# Patient Record
Sex: Male | Born: 1950
Health system: Southern US, Community
[De-identification: ages and names within clinical notes are randomized; demographics above are authoritative.]

## PROBLEM LIST (undated history)

## (undated) DIAGNOSIS — Z8719 Personal history of other diseases of the digestive system: Secondary | ICD-10-CM

## (undated) DIAGNOSIS — F329 Major depressive disorder, single episode, unspecified: Secondary | ICD-10-CM

## (undated) DIAGNOSIS — I2510 Atherosclerotic heart disease of native coronary artery without angina pectoris: Secondary | ICD-10-CM

## (undated) DIAGNOSIS — K219 Gastro-esophageal reflux disease without esophagitis: Secondary | ICD-10-CM

## (undated) DIAGNOSIS — M79606 Pain in leg, unspecified: Secondary | ICD-10-CM

## (undated) DIAGNOSIS — E079 Disorder of thyroid, unspecified: Secondary | ICD-10-CM

## (undated) DIAGNOSIS — F32A Depression, unspecified: Secondary | ICD-10-CM

## (undated) DIAGNOSIS — I469 Cardiac arrest, cause unspecified: Secondary | ICD-10-CM

## (undated) DIAGNOSIS — E039 Hypothyroidism, unspecified: Secondary | ICD-10-CM

## (undated) DIAGNOSIS — Z95810 Presence of automatic (implantable) cardiac defibrillator: Secondary | ICD-10-CM

## (undated) DIAGNOSIS — F419 Anxiety disorder, unspecified: Secondary | ICD-10-CM

## (undated) DIAGNOSIS — E876 Hypokalemia: Secondary | ICD-10-CM

## (undated) DIAGNOSIS — J45909 Unspecified asthma, uncomplicated: Secondary | ICD-10-CM

## (undated) DIAGNOSIS — I5042 Chronic combined systolic (congestive) and diastolic (congestive) heart failure: Secondary | ICD-10-CM

## (undated) DIAGNOSIS — Z720 Tobacco use: Secondary | ICD-10-CM

## (undated) DIAGNOSIS — I4891 Unspecified atrial fibrillation: Secondary | ICD-10-CM

## (undated) DIAGNOSIS — E785 Hyperlipidemia, unspecified: Secondary | ICD-10-CM

## (undated) HISTORY — DX: Chronic combined systolic (congestive) and diastolic (congestive) heart failure: I50.42

## (undated) HISTORY — DX: Hyperlipidemia, unspecified: E78.5

## (undated) HISTORY — DX: Depression, unspecified: F32.A

## (undated) HISTORY — DX: Tobacco use: Z72.0

## (undated) HISTORY — DX: Anxiety disorder, unspecified: F41.9

## (undated) HISTORY — DX: Pain in leg, unspecified: M79.606

## (undated) HISTORY — DX: Disorder of thyroid, unspecified: E07.9

## (undated) HISTORY — DX: Cardiac arrest, cause unspecified: I46.9

## (undated) HISTORY — DX: Major depressive disorder, single episode, unspecified: F32.9

## (undated) HISTORY — DX: Unspecified atrial fibrillation: I48.91

## (undated) HISTORY — DX: Hypokalemia: E87.6

---
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
## 1989-04-10 HISTORY — PX: CARDIAC DEFIBRILLATOR PLACEMENT: SHX171

## 1992-04-10 DIAGNOSIS — I469 Cardiac arrest, cause unspecified: Secondary | ICD-10-CM

## 1992-04-10 HISTORY — DX: Cardiac arrest, cause unspecified: I46.9

## 2003-02-17 ENCOUNTER — Ambulatory Visit (HOSPITAL_COMMUNITY): Admission: RE | Admit: 2003-02-17 | Discharge: 2003-02-17 | Payer: Self-pay | Admitting: Internal Medicine

## 2003-02-26 ENCOUNTER — Encounter: Admission: RE | Admit: 2003-02-26 | Discharge: 2003-02-26 | Payer: Self-pay | Admitting: Internal Medicine

## 2004-05-18 ENCOUNTER — Ambulatory Visit: Payer: Self-pay | Admitting: Internal Medicine

## 2004-05-25 ENCOUNTER — Ambulatory Visit: Payer: Self-pay

## 2005-12-19 ENCOUNTER — Ambulatory Visit: Payer: Self-pay | Admitting: Internal Medicine

## 2006-05-03 ENCOUNTER — Ambulatory Visit: Payer: Self-pay | Admitting: Internal Medicine

## 2006-07-26 ENCOUNTER — Ambulatory Visit: Payer: Self-pay | Admitting: Internal Medicine

## 2007-01-04 ENCOUNTER — Ambulatory Visit: Payer: Self-pay | Admitting: Internal Medicine

## 2007-01-28 ENCOUNTER — Ambulatory Visit: Payer: Self-pay | Admitting: Internal Medicine

## 2007-01-28 LAB — CONVERTED CEMR LAB
BUN: 8 mg/dL (ref 6–23)
Basophils Absolute: 0 10*3/uL (ref 0.0–0.1)
Basophils Relative: 0.2 % (ref 0.0–1.0)
CO2: 29 meq/L (ref 19–32)
Calcium: 9.2 mg/dL (ref 8.4–10.5)
Chloride: 103 meq/L (ref 96–112)
Creatinine, Ser: 0.9 mg/dL (ref 0.4–1.5)
Eosinophils Absolute: 0.1 10*3/uL (ref 0.0–0.6)
Eosinophils Relative: 0.5 % (ref 0.0–5.0)
GFR calc Af Amer: 113 mL/min
GFR calc non Af Amer: 93 mL/min
Glucose, Bld: 97 mg/dL (ref 70–99)
HCT: 39 % (ref 39.0–52.0)
Hemoglobin: 13.5 g/dL (ref 13.0–17.0)
INR: 0.8 (ref 0.8–1.0)
Lymphocytes Relative: 27.5 % (ref 12.0–46.0)
MCHC: 34.6 g/dL (ref 30.0–36.0)
MCV: 99.2 fL (ref 78.0–100.0)
Monocytes Absolute: 0.3 10*3/uL (ref 0.2–0.7)
Monocytes Relative: 2.5 % — ABNORMAL LOW (ref 3.0–11.0)
Neutro Abs: 6.9 10*3/uL (ref 1.4–7.7)
Neutrophils Relative %: 69.3 % (ref 43.0–77.0)
Platelets: 151 10*3/uL (ref 150–400)
Potassium: 4.4 meq/L (ref 3.5–5.1)
Prothrombin Time: 10.9 s (ref 10.9–13.3)
RBC: 3.93 M/uL — ABNORMAL LOW (ref 4.22–5.81)
RDW: 12.7 % (ref 11.5–14.6)
Sodium: 137 meq/L (ref 135–145)
WBC: 10.1 10*3/uL (ref 4.5–10.5)
aPTT: 23.7 s (ref 21.7–29.8)

## 2007-01-30 ENCOUNTER — Ambulatory Visit: Payer: Self-pay | Admitting: Internal Medicine

## 2007-01-30 ENCOUNTER — Observation Stay (HOSPITAL_COMMUNITY): Admission: RE | Admit: 2007-01-30 | Discharge: 2007-01-30 | Payer: Self-pay | Admitting: Internal Medicine

## 2007-02-14 ENCOUNTER — Ambulatory Visit: Payer: Self-pay

## 2007-05-21 ENCOUNTER — Ambulatory Visit: Payer: Self-pay | Admitting: Internal Medicine

## 2007-08-22 ENCOUNTER — Ambulatory Visit: Payer: Self-pay | Admitting: Internal Medicine

## 2007-11-21 ENCOUNTER — Ambulatory Visit: Payer: Self-pay | Admitting: Internal Medicine

## 2008-02-20 ENCOUNTER — Ambulatory Visit: Payer: Self-pay | Admitting: Internal Medicine

## 2008-06-05 ENCOUNTER — Encounter: Payer: Self-pay | Admitting: Internal Medicine

## 2008-12-10 ENCOUNTER — Ambulatory Visit: Payer: Self-pay | Admitting: Internal Medicine

## 2008-12-11 ENCOUNTER — Encounter: Payer: Self-pay | Admitting: Internal Medicine

## 2009-03-11 ENCOUNTER — Ambulatory Visit: Payer: Self-pay | Admitting: Internal Medicine

## 2009-03-15 ENCOUNTER — Encounter: Payer: Self-pay | Admitting: Internal Medicine

## 2009-03-17 ENCOUNTER — Encounter: Payer: Self-pay | Admitting: Internal Medicine

## 2009-03-24 ENCOUNTER — Telehealth: Payer: Self-pay | Admitting: Internal Medicine

## 2009-06-08 ENCOUNTER — Ambulatory Visit: Payer: Self-pay | Admitting: Internal Medicine

## 2009-06-08 DIAGNOSIS — F17200 Nicotine dependence, unspecified, uncomplicated: Secondary | ICD-10-CM | POA: Insufficient documentation

## 2009-09-09 ENCOUNTER — Ambulatory Visit: Payer: Self-pay | Admitting: Internal Medicine

## 2009-09-10 ENCOUNTER — Encounter: Payer: Self-pay | Admitting: Internal Medicine

## 2009-12-09 ENCOUNTER — Ambulatory Visit: Payer: Self-pay | Admitting: Internal Medicine

## 2009-12-10 ENCOUNTER — Encounter (INDEPENDENT_AMBULATORY_CARE_PROVIDER_SITE_OTHER): Payer: Self-pay | Admitting: *Deleted

## 2010-03-10 ENCOUNTER — Ambulatory Visit: Payer: Self-pay | Admitting: Internal Medicine

## 2010-04-20 ENCOUNTER — Encounter (INDEPENDENT_AMBULATORY_CARE_PROVIDER_SITE_OTHER): Payer: Self-pay | Admitting: *Deleted

## 2010-05-10 NOTE — Cardiovascular Report (Signed)
Summary: Office Visit Remote   Office Visit Remote   Imported By: Roderic Ovens 01/03/2010 16:08:18  _____________________________________________________________________  External Attachment:    Type:   Image     Comment:   External Document

## 2010-05-10 NOTE — Cardiovascular Report (Signed)
Summary: Office Visit Remote   Office Visit Remote   Imported By: Roderic Ovens 10/19/2009 13:35:56  _____________________________________________________________________  External Attachment:    Type:   Image     Comment:   External Document

## 2010-05-10 NOTE — Cardiovascular Report (Signed)
Summary: Office Visit   Office Visit   Imported By: Roderic Ovens 06/18/2009 11:08:56  _____________________________________________________________________  External Attachment:    Type:   Image     Comment:   External Document

## 2010-05-10 NOTE — Letter (Signed)
Summary: Device-Delinquent Phone Journalist, newspaper, Main Office  [address]   [place], [place] [address]   Phone: [phone]  Fax: [phone]     December 10, 2009 MRN: [id number]   Jeremy Walker [address] [place], [place]  [address]   Dear [name],  According to our records, you were scheduled for a device phone transmission on   12/09/09.                           .     We did not receive any results from this check.  If you transmitted on your scheduled day, please call us to help troubleshoot your system.  If you forgot to send your transmission, please send one upon receipt of this letter.  Thank you, Altha Harm, LPN  December 10, 2009 3:46 PM   [facility]

## 2010-05-10 NOTE — Assessment & Plan Note (Signed)
Summary: F1Y DEF CK GUIDANT      Allergies Added: NKDA  CC:  f1y device check guidant.  .  History of Present Illness: [name] is seen in followup for aborted cardiac arrest. He is status post ICD implantation. These previous events both occurs in a profound hypokalemia the cause of which was never understood  He continues to smoke     Current Medications (verified): 1)  Atenolol 50 Mg Tabs (Atenolol) .... Take One Tablet Once Daily  Allergies (verified): No Known Drug Allergies  Past History:  Past Medical History: Last updated: 06/07/2009 ICD -- Guidant - 1857 /Aborted sudden cardiac death  Ongoing cigarette abuse  Past Surgical History: Last updated: 06/07/2009 AICD - Guidant -1857 - Aborted sudden cardiac death  Vital Signs:  Patient profile:   60 year old male Height:      72 inches Weight:      171 pounds BMI:     23.28 Pulse rate:   67 / minute Pulse rhythm:   regular BP sitting:   103 / 67  (right arm) Cuff size:   large  Vitals Entered By: Judithe Modest CMA (June 08, 2009 4:16 PM)  Physical Exam  General:  The patient was alert and oriented in no acute distress. HEENT Normal.  Neck veins were flat, carotids were brisk.  Lungs were clear.  Heart sounds were regular without murmurs or gallops.  Abdomen was soft with active bowel sounds. There is no clubbing cyanosis or edema. Skin Warm and dry smells like an ashtray    ICD Specifications Following MD:  Sherryl Manges, MD     ICD Vendor:  [facility] [employer]     ICD Model Number:  [id number]     ICD Serial Number:  [id number] ICD DOI:  01/30/2007     ICD Implanting MD:  Sherryl Manges, MD  Lead 1:    Location: RV     DOI: 05/28/1992     Model #: 1027     Serial #: [id number]     Status: active  Indications::  Aborted Sudden Cardiac Death   ICD Follow Up Remote Check?  No Battery Voltage:  3.13 V     Charge Time:  14.6 seconds     Underlying rhythm:  SR ICD Dependent:  No       ICD Device  Measurements Right Ventricle:  Amplitude: 7.1 mV, Impedance: 373 ohms, Threshold: 2.0 V at 0.5 msec  Episodes Coumadin:  No Shock:  0     ATP:  0     Nonsustained:  0     Ventricular Pacing:  0%  Brady Parameters Mode VVI     Lower Rate Limit:  50      Tachy Zones VF:  210     Next Remote Date:  09/09/2009     Next Cardiology Appt Due:  06/09/2010 Tech Comments:  No parameter changes.  Device function normal.  Latitude transmissions every 3 months.  ROV 1 year with Dr. Graciela Husbands. Altha Harm, LPN  June 08, 4032 4:30 PM   Impression & Recommendations:  Problem # 1:  ICD - IN SITU - GUIDANT - 1857 (ICD-V45.02) Device parameters and data were reviewed and no changes were made   Orders: EKG w/ Interpretation (93[address])  Problem # 2:  VENTRICULAR FIBRILLATION-ABORTED SCD (ICD-427.41) no recurrent events His updated medication list for this problem includes:    Atenolol 50 Mg Tabs (Atenolol) .Marland Kitchen... Take one tablet once daily  Problem #  3:  TOBACCO ABUSE (ICD-305.1) talked about it again

## 2010-05-12 NOTE — Letter (Signed)
Summary: Remote Device Check  [employer], Main Office  [address]   [place], [place] [address]   Phone: [phone]  Fax: [phone]     April 20, 2010 MRN: [id number]   KEVYN BOQUET [address] [place], [place]  [address]   Dear Mr. Luckow,   Your remote transmission was recieved and reviewed by your physician.  All diagnostics were within normal limits for you.  _____Your next transmission is scheduled for:                       .  Please transmit at any time this day.  If you have a wireless device your transmission will be sent automatically.  ______Your next office visit is scheduled for:                              . Please call our office to schedule an appointment.    Sincerely,  Vella Kohler

## 2010-05-12 NOTE — Cardiovascular Report (Signed)
Summary: Office Visit Remote   Office Visit Remote   Imported By: Roderic Ovens 04/22/2010 11:09:44  _____________________________________________________________________  External Attachment:    Type:   Image     Comment:   External Document

## 2010-05-12 NOTE — Letter (Signed)
Summary: Remote Device Check  [employer], Main Office  [address]   [place], [place] [address]   Phone: [phone]  Fax: [phone]     April 20, 2010 MRN: [id number]   ALEXX MCBURNEY [address] [place], [place]  [address]   Dear Mr. Mabile,   Your remote transmission was recieved and reviewed by your physician.  All diagnostics were within normal limits for you.   ___X___Your next office visit is scheduled for: 07-05-10 @ 415pm with Dr Graciela Husbands.    Sincerely,  Vella Kohler

## 2010-06-17 ENCOUNTER — Encounter (INDEPENDENT_AMBULATORY_CARE_PROVIDER_SITE_OTHER): Payer: Self-pay | Admitting: *Deleted

## 2010-06-21 ENCOUNTER — Encounter: Payer: Self-pay | Admitting: Internal Medicine

## 2010-06-21 NOTE — Letter (Signed)
Summary: Appointment - Reschedule  [employer], Main Office  [address]   [place], [place] [address]   Phone: [phone]  Fax: [phone]     June 17, 2010 MRN: [id number]   ROSCOE WITTS [address] [place], [place]  [address]   Dear Mr. Funez,   Due to a change in our office schedule, your appointment on   07-05-2010                    at  4:00 p.m.  must be changed.  It is very important that we reach you to reschedule this appointment. We look forward to participating in your health care needs. Please contact us at the number listed above at your earliest convenience to reschedule this appointment.     Sincerely,     Lorne Skeens  [facility] Scheduling Team

## 2010-07-05 ENCOUNTER — Encounter: Payer: Self-pay | Admitting: Internal Medicine

## 2010-07-26 ENCOUNTER — Ambulatory Visit (INDEPENDENT_AMBULATORY_CARE_PROVIDER_SITE_OTHER): Payer: 59 | Admitting: Internal Medicine

## 2010-07-26 ENCOUNTER — Encounter: Payer: Self-pay | Admitting: Internal Medicine

## 2010-07-26 VITALS — BP 113/73 | HR 73 | Ht 72.0 in | Wt 168.4 lb

## 2010-07-26 DIAGNOSIS — I4901 Ventricular fibrillation: Secondary | ICD-10-CM

## 2010-07-26 DIAGNOSIS — Z95810 Presence of automatic (implantable) cardiac defibrillator: Secondary | ICD-10-CM | POA: Insufficient documentation

## 2010-07-26 DIAGNOSIS — I428 Other cardiomyopathies: Secondary | ICD-10-CM

## 2010-07-26 DIAGNOSIS — F17200 Nicotine dependence, unspecified, uncomplicated: Secondary | ICD-10-CM

## 2010-07-26 NOTE — Patient Instructions (Signed)
Your physician recommends that you schedule a follow-up appointment in: YEAR WITH DR KLEIN  Your physician recommends that you continue on your current medications as directed. Please refer to the Current Medication list given to you today. 

## 2010-07-26 NOTE — Progress Notes (Signed)
  HPI  Jeremy Walker is a 60 y.o. male seen in followup for blurred cardiac arrest for which he is status post ICD. He had no intercurrent arrhythmias.  He continues to smoke. He think he can stop today. He has chronic shortness of breath.   Past Medical History  Diagnosis Date  . Sudden cardiac death     aborted    Past Surgical History  Procedure Date  . Cardiac defibrillator placement     guidant    Current Outpatient Prescriptions  Medication Sig Dispense Refill  . atenolol (TENORMIN) 50 MG tablet Take 50 mg by mouth daily.          No Known Allergies  Review of Systems negative except from HPI and PMH  Physical Exam Well developed and well nourished in no acute distress HENT normal with poor dentition E scleral and icterus clear Neck Supple JVP flat; carotids brisk and full Clear to ausculation but decreased breath sounds Regular rate and rhythm, no murmurs gallops or rub Soft with active bowel sounds No clubbing cyanosis and edema Alert and oriented, grossly normal motor and sensory function Skin Warm and Dry     Assessment and  Plan

## 2010-07-26 NOTE — Assessment & Plan Note (Signed)
The patient's device was interrogated.  The information was reviewed. No changes were made in the programming.    

## 2010-07-26 NOTE — Assessment & Plan Note (Signed)
No nintercurrent arrhtyhmias

## 2010-07-26 NOTE — Assessment & Plan Note (Signed)
He s going to try STOPPING today  HIs wife jsut stopped

## 2010-08-23 NOTE — Assessment & Plan Note (Signed)
[facility]                         ELECTROPHYSIOLOGY OFFICE NOTE   JIMI, SCHAPPERT                         MRN:          [id number]  DATE:02/14/2007                            DOB:          10-18-1950    Mr. Withers was seen today in the device clinic for followup of his newly  changed out Guidant ICD, model number is 1857.  Date of implant was  January 30, 2007, for aborted sudden cardiac death.  Interrogation of  his device demonstrates R waves of 6 mV with an RV impedance of 337 ohms  and a threshold of 2.8 V at 0.5 msec.  His shock impedance was 50 ohms,  and his battery voltage was 3.24 V with a charge time of 13.6 seconds.  He was in sinus rhythm today and does not V pace.  He has had no  episodes of any arrhythmias since last interrogation.  His device is  implanted in his abdomen.  His Steri-Strips were removed today.  His  wound was without redness or swelling.  He will return to clinic in 3  months with Dr. Graciela Husbands.      Gypsy Balsam, RN,BSN  Electronically Signed      Duke Salvia, MD, [facility]  Electronically Signed   AS/MedQ  DD: 02/18/2007  DT: 02/18/2007  Job #: [id number]

## 2010-08-23 NOTE — Op Note (Signed)
NAMEOREST, DYGERT NO.:  [id number]   MEDICAL RECORD NO.:  [id number]          PATIENT TYPE:  INP   LOCATION:  2899                         FACILITY:  [facility]   PHYSICIAN:  Duke Salvia, MD, FACCDATE OF BIRTH:  [birth year]   DATE OF PROCEDURE:  01/30/2007  DATE OF DISCHARGE:                               OPERATIVE REPORT   PREOPERATIVE DIAGNOSIS:  Aborted sudden death with previously implanted  abdominal defibrillator, now at end of life.   POSTOPERATIVE DIAGNOSIS:  Aborted sudden death with previously implanted  abdominal defibrillator, now at end of life.   PROCEDURE:  Explantation of a previously implanted device, implantation  of a new device with intraoperative defibrillation threshold testing.   Following obtaining informed consent, the patient was brought to the  electrophysiology laboratory and placed on the fluoroscopic table in  supine position.  After routine prep and drape, lidocaine was  infiltrated along the line of his previous abdominal incision.  The  incision was made and carried down to layer of the pocket using blunt  dissection and electrocautery.  The pocket was opened.   The previously implanted device was freed up.  It had a dual head bipole  and dual head defibrillator coil.   Interrogation of the previously implanted lead demonstrated an R wave of  8.2, with impedance of 512, a threshold of 2.5 volts at 0.5  milliseconds.  Current at threshold 1.5 MA, pacing impedance through the  distal coil was 440 ohms and through the proximal coil was 382 ohms.   With these acceptable parameters recorded, the leads were attached to a  Guidant Prism HEICD model 1857, serial number [id number].  Through the  device, the R wave of 7.8 with impedance of 433, a threshold 2.4, 0.5  milliseconds.  High-voltage impedance was 48 ohms.   DFT testing was undertaken.  Ventricular fibrillation was induced via  the T-wave shock.  After a total duration of 9  seconds, a 21 joules  shock was delivered through measured resistance of 50 ohms terminating  ventricular fibrillation  restoring sinus rhythm.  The patient's pocket was copiously irrigated  with antibiotic containing saline solution.  Hemostasis was assured.  The leads and pulse generator were placed in the pocket.  The wound was  closed in three layers using Monocryl suture.  The patient tolerated the  procedure well and without apparent complication.      Duke Salvia, MD, [facility]  Electronically Signed     SCK/MEDQ  D:  01/30/2007  T:  01/31/2007  Job:  [id number]   cc:   Electrophys lab  [employer] pacemaker cl

## 2010-08-23 NOTE — Assessment & Plan Note (Signed)
[facility]                         ELECTROPHYSIOLOGY OFFICE NOTE   TALLON, GERTZ                         MRN:          [id number]  DATE:01/04/2007                            DOB:          03-01-1951    Mr. Ramsburg comes in.  He has aborted cardiac arrest.  He is status post  ICD implantation.  He has had no intercurrent problems.  He continues to  smoke.  He has no problems with chest pain.  He does have exercise  intolerance.   PRESENT MEDICATIONS:  Include the Atenolol 50.   EXAMINATION:  His blood pressure is 102/64, pulse is 68.  LUNGS:  Clear.  HEART SOUNDS:  Regular.  EXTREMITIES:  Without edema.  SKIN:  Warm and dry.   Interrogation of his Guidant Prizm ICD demonstrates an R-wave of R-wave  of 5.8 with impedance of 335 and threshold at 2 volts at 0.5.  Battery  voltage is 2.71, representing status MOL1.   IMPRESSION:  1. Aborted cardiac arrest (ACA).  2. Status post ICD for the above.  3. Ongoing cigarette abuse.   Mr. Weigel is stable from a rhythm point of view.  He and his wife and I  had a lengthy discussion today regarding smoking cessation, the  importance of choosing a plan, the potential benefits of Chantix, the  value of doing it simultaneously and trying to come up with alternative  behaviors to try to mitigate against the suggestive parts of smoking.  I  failed to tell them, but will the next time, about the 1-800 STOPNOW  phone service.     Duke Salvia, MD, [facility]  Electronically Signed    SCK/MedQ  DD: 01/04/2007  DT: 01/05/2007  Job #: [id number]

## 2010-08-23 NOTE — Assessment & Plan Note (Signed)
[facility]                         ELECTROPHYSIOLOGY OFFICE NOTE   [name], [name]                         MRN:          [id number]  DATE:05/21/2007                            DOB:          1950-11-16    [name] has a history of aborted sudden cardiac death.  He is status  post ICD implantation with device generator placement in October.  He is  doing well without intercurrent problems.  He continues to smoke.  He  continues to have shortness of breath.   MEDICATIONS:  Include atenolol 50 mg a day.   EXAMINATION:  His blood pressure is 100/78, his pulse was 68.  LUNGS:  Clear.  HEART:  Sounds were regular.  EXTREMITIES:  Without edema.   Interrogation of his Guidant ICD demonstrates an R wave of 7.9 with  impedance of 366, a threshold of 2.8 volts at 0.5 milliseconds.  Battery  voltage is 3.24.   IMPRESSION:  1. Aborted sudden cardiac death.  2. Status post implantable cardioverter-defibrillator for the above.  3. Ongoing smoking.   [name] continues to smoke despite our encouragements and admonitions  to the contrary.  We will plan to see him again in 3-6 months' time.     Jeremy Salvia, MD, [facility]  Electronically Signed    SCK/MedQ  DD: 05/21/2007  DT: 05/23/2007  Job #: [id number]

## 2010-08-26 NOTE — Assessment & Plan Note (Signed)
[facility]                         ELECTROPHYSIOLOGY OFFICE NOTE   RAM, HAUGAN                         MRN:          [id number]  DATE:06/09/2008                            DOB:          1950/04/24    Jeremy Walker is seen in followup for aborted sudden cardiac death.  He is  status post ICD implantation with a generator replacement in October  2008.  He had no intercurrent problems with chest pain or shortness of  breath, apart from his chronic dyspnea associated with his smoking.  He  continues to smoke like a chimney, and he and his wife are here today  and they both smell like ashtrays.  They are adamant about continuing  smoking and we dropped it there.   MEDICATIONS:  Atenolol 50.   PHYSICAL EXAMINATION:  VITAL SIGNS:  His blood pressure is 125/77, pulse  is 83, weight was 175 which is up 6 pounds.  LUNGS:  Clear unbelievably.  HEART:  Sounds were regular.  EXTREMITIES:  No edema.   Interrogation of his Guidant ICD demonstrates an R-wave of 7.6 with  impedance of 380, threshold of 2.6 at 0.5.  Shock impedance was 54.  Battery voltage was 3.22.   IMPRESSION:  1. Aborted sudden cardiac death.  2. Status post implantable cardioverter-defibrillator for the above.  3. Ongoing cigarette abuse.   Jeremy Walker is stable and we will see him again in a year and he will be  followed up remotely in the interim.     Duke Salvia, MD, [facility]  Electronically Signed    SCK/MedQ  DD: 06/09/2008  DT: 06/10/2008  Job #: [id number]

## 2010-08-26 NOTE — Op Note (Signed)
NAMECOLSEN, MODI                          ACCOUNT NO.:  [id number]   MEDICAL RECORD NO.:  [id number]                   PATIENT TYPE:  OIB   LOCATION:  2855                                 FACILITY:  [facility]   PHYSICIAN:  Duke Salvia, M.D.               DATE OF BIRTH:  12-29-1950   DATE OF PROCEDURE:  02/17/2003  DATE OF DISCHARGE:                                 OPERATIVE REPORT   PREOPERATIVE DIAGNOSIS:  Recurrent ventricular fibrillation.   POSTOPERATIVE DIAGNOSIS:  Recurrent ventricular fibrillation.   PROCEDURE:  Explanation from previous implanted abdominal defibrillator with  implantation of new defibrillator.  Intraoperative defibrillation threshold  testing.   SURGEON:  Duke Salvia, M.D.   DESCRIPTION OF PROCEDURE:  Following the obtainment of informed consent, the  patient was brought to the Electrophysiology Laboratory and placed on the  fluoroscopic table in the supine position.  After routine prep and drape,  lidocaine was infiltrated along the line of the previous incision and  carried down to the layer of the abdominal pocket using blunt dissection and  electrocautery.  The pocket was opened.  Great care was taken keeping all of  these in their native position.  The previously implanted defibrillator was  explanted.  The patient had a previously implanted 0064 defibrillator lead  and a bifurcated rate sense lead.  Through the analyzer, the bipolar R-wave  was 10 mV with an impedance of 431 and pacing threshold of 1.8 volts at 0.5  msec.  Current threshold was 1.7 in May.  The proximal coil impedance was  416.  The distal coil was 399.   When these acceptable parameters were recorded, the leads were then attached  to a Guidant Prism HVRHE model 1857 defibrillator, serial number [id number].  This had the appropriate header.  Through the device, a bipolar R-wave of  6.6 mV with a pacing impedance of 326 ohms the pacing threshold of 2.4 volts  at 0.5 msec. The  high voltage impedance was 47 ohms.   With the acceptable parameters recorded, defibrillator threshold testing was  undertaken.  Ventricular fibrillation was induced via the dual wave shock.  After a total duration of 8 seconds, a 14 joule shock was delivered through  a measured resistance of 50 ohms, terminating in ventricular fibrillation  and restoring a sinus/paced rhythm.  The pocket was copiously irrigated with  antibiotic-containing saline solution.  Hemostasis was assured.  The wound  was closed in three layers in a normal fashion.  The wound was washed, dried  and Benzoin and Steri-Strips dressing was  applied.  Needle counts, sponge counts and instrument counts were correct at  the end of the procedure according to the staff.   The patient tolerated the procedure without apparent complication.  Duke Salvia, M.D.    SCK/MEDQ  D:  02/17/2003  T:  02/17/2003  Job:  [id number]   cc:   Electrophysiology Laboratory   E. Graceann Congress, M.D.

## 2010-08-26 NOTE — Assessment & Plan Note (Signed)
[facility]                           ELECTROPHYSIOLOGY OFFICE NOTE   VERLIN, Jeremy Walker                         MRN:          [id number]  DATE:12/19/2005                            DOB:          Dec 11, 1950    [name] has a history of aborted sudden cardiac death.  He is status post  ICD implantation and he is doing quite well.  He continues to smoke.  He  takes atenolol 50 mg a day.   EXAMINATION:  VITAL SIGNS:  His blood pressure is 115/64.  His pulse is 69.  LUNGS:  Lungs were clear.  HEART:  Heart sounds were regular.  EXTREMITIES:  The extremities were without edema.   Interrogation of his Guidant Prizm [id number] ICD demonstrates an R wave of 4.0,  an impedance of 332, and threshold of 2.4 at 0.5, battery voltage is 3.12.   IMPRESSION:  1. Aborted sudden cardiac death.  2. Status post ICD for the above.  He will begin a remote followup and we      will see him again in 1 year's time.  He is further advised to stop      smoking.                                   Duke Salvia, MD, [facility]   SCK/MedQ  DD:  12/19/2005  DT:  12/20/2005  Job #:  [id number]

## 2010-09-23 ENCOUNTER — Telehealth: Payer: Self-pay | Admitting: Internal Medicine

## 2010-09-23 NOTE — Telephone Encounter (Signed)
Call patient to reschedule his appointment from 6/26 to 6/22  With  Dr. Graciela Husbands. Pt has question ask to why he was never told his device reach ERI.

## 2010-09-23 NOTE — Telephone Encounter (Signed)
Device @ ERI 09/17/10, patient aware.  Follow up scheduled 6/22@ 4:15pm

## 2010-09-30 ENCOUNTER — Ambulatory Visit (INDEPENDENT_AMBULATORY_CARE_PROVIDER_SITE_OTHER): Payer: 59 | Admitting: Internal Medicine

## 2010-09-30 ENCOUNTER — Encounter: Payer: Self-pay | Admitting: *Deleted

## 2010-09-30 ENCOUNTER — Encounter: Payer: Self-pay | Admitting: Internal Medicine

## 2010-09-30 DIAGNOSIS — F17200 Nicotine dependence, unspecified, uncomplicated: Secondary | ICD-10-CM

## 2010-09-30 DIAGNOSIS — Z95810 Presence of automatic (implantable) cardiac defibrillator: Secondary | ICD-10-CM

## 2010-09-30 DIAGNOSIS — I4901 Ventricular fibrillation: Secondary | ICD-10-CM

## 2010-09-30 NOTE — Assessment & Plan Note (Signed)
Still unwilling to stop

## 2010-09-30 NOTE — Patient Instructions (Signed)
Your physician has recommended that you have a defibrillator generator replacement.  Please see the instruction sheet given to you today for more information.

## 2010-09-30 NOTE — Assessment & Plan Note (Signed)
No recurrent arrhythmia. 

## 2010-09-30 NOTE — Assessment & Plan Note (Signed)
Patient's device has reached ERI. We'll plan to undertake device generator replacement with the use of adapter So as to be able to be use newer technology. Risks and benefits have been reviewed including the possibilities of infection and the fracture. We'll use an AEGIS pouch

## 2010-09-30 NOTE — Progress Notes (Signed)
  HPI  Jeremy Walker is a 60 y.o. male Followup for a reported cardiac arrest that happened recurrently many years ago.  On both occasions he were associated with profound hypokalemia the cause of which was never elucidated. He is status post ICD implantation and prior generator replacement. He has reached ERI.  He denies chest pain and lower extremity claudication. He has chronic shortness of breath. He's not had any edema  Past Medical History  Diagnosis Date  . Sudden cardiac death     aborted    Past Surgical History  Procedure Date  . Cardiac defibrillator placement     guidant    Current Outpatient Prescriptions  Medication Sig Dispense Refill  . atenolol (TENORMIN) 50 MG tablet Take 50 mg by mouth daily.          No Known Allergies  Review of Systems negative except from HPI and PMH  Physical Exam Well developed and well nourished in no acute distress With an odor of tobacco HENT normal E scleral and icterus clear Neck Supple JVP flat; carotids brisk and full Clear to ausculation But decreased breath sounds Regular rate and rhythm, no murmurs gallops or rub Soft with active bowel sounds No clubbing cyanosis and edema Alert and oriented, grossly normal motor and sensory function Decreased peripheral pulses Skin Warm and Dry  ECG  Assessment and  Plan

## 2010-10-04 ENCOUNTER — Encounter: Payer: 59 | Admitting: Internal Medicine

## 2010-10-10 ENCOUNTER — Other Ambulatory Visit: Payer: 59 | Admitting: *Deleted

## 2010-10-10 ENCOUNTER — Other Ambulatory Visit (INDEPENDENT_AMBULATORY_CARE_PROVIDER_SITE_OTHER): Payer: 59

## 2010-10-10 DIAGNOSIS — I4901 Ventricular fibrillation: Secondary | ICD-10-CM

## 2010-10-10 DIAGNOSIS — Z95810 Presence of automatic (implantable) cardiac defibrillator: Secondary | ICD-10-CM

## 2010-10-10 DIAGNOSIS — F17200 Nicotine dependence, unspecified, uncomplicated: Secondary | ICD-10-CM

## 2010-10-10 LAB — CBC WITH DIFFERENTIAL/PLATELET
Basophils Absolute: 0.3 10*3/uL — ABNORMAL HIGH (ref 0.0–0.1)
Basophils Relative: 1.9 % (ref 0.0–3.0)
Eosinophils Absolute: 0 10*3/uL (ref 0.0–0.7)
Eosinophils Relative: 0.3 % (ref 0.0–5.0)
HCT: 43.1 % (ref 39.0–52.0)
Hemoglobin: 14.9 g/dL (ref 13.0–17.0)
Lymphocytes Relative: 14.5 % (ref 12.0–46.0)
Lymphs Abs: 2.2 10*3/uL (ref 0.7–4.0)
MCHC: 34.7 g/dL (ref 30.0–36.0)
MCV: 99.1 fl (ref 78.0–100.0)
Monocytes Absolute: 0.5 10*3/uL (ref 0.1–1.0)
Monocytes Relative: 3.1 % (ref 3.0–12.0)
Neutro Abs: 12.3 10*3/uL — ABNORMAL HIGH (ref 1.4–7.7)
Neutrophils Relative %: 80.2 % — ABNORMAL HIGH (ref 43.0–77.0)
Platelets: 144 10*3/uL — ABNORMAL LOW (ref 150.0–400.0)
RBC: 4.35 Mil/uL (ref 4.22–5.81)
RDW: 14.5 % (ref 11.5–14.6)
WBC: 15.4 10*3/uL — ABNORMAL HIGH (ref 4.5–10.5)

## 2010-10-10 LAB — BASIC METABOLIC PANEL
BUN: 14 mg/dL (ref 6–23)
CO2: 28 mEq/L (ref 19–32)
Calcium: 9.4 mg/dL (ref 8.4–10.5)
Chloride: 103 mEq/L (ref 96–112)
Creatinine, Ser: 1.1 mg/dL (ref 0.4–1.5)
GFR: 72.75 mL/min (ref >60.00)
Glucose, Bld: 99 mg/dL (ref 70–99)
Potassium: 4.7 mEq/L (ref 3.5–5.1)
Sodium: 140 mEq/L (ref 135–145)

## 2010-10-10 LAB — PROTIME-INR
INR: 0.9 ratio (ref 0.8–1.0)
Prothrombin Time: 10.3 s (ref 10.2–12.4)

## 2010-10-11 ENCOUNTER — Telehealth: Payer: Self-pay | Admitting: Internal Medicine

## 2010-10-11 NOTE — Telephone Encounter (Signed)
Patient WBC 15.4 (H) in pre-op labs. Pt is scheduled for ICD placement on 10/13/10.  Pt's wife states that pt. Is not running a fever. But yesterday he worked in a hot weather all day before he got the labs done, and  maybe that is why his WBC are elevated (per wife).

## 2010-10-11 NOTE — Telephone Encounter (Signed)
Order for repeat CBC in Short stay Thursday morning send. Pt. aware

## 2010-10-11 NOTE — Telephone Encounter (Signed)
Returning call back to [place]. Per pt wife - pls wait 30 min before calling back.

## 2010-10-13 ENCOUNTER — Ambulatory Visit (HOSPITAL_COMMUNITY)
Admission: RE | Admit: 2010-10-13 | Discharge: 2010-10-13 | Disposition: A | Discharge status: Home / Self Care | Payer: 59 | Source: Ambulatory Visit | Attending: Internal Medicine | Admitting: Internal Medicine

## 2010-10-13 ENCOUNTER — Ambulatory Visit (HOSPITAL_COMMUNITY): Payer: 59

## 2010-10-13 DIAGNOSIS — Z8674 Personal history of sudden cardiac arrest: Secondary | ICD-10-CM | POA: Insufficient documentation

## 2010-10-13 DIAGNOSIS — I469 Cardiac arrest, cause unspecified: Secondary | ICD-10-CM

## 2010-10-13 DIAGNOSIS — Z79899 Other long term (current) drug therapy: Secondary | ICD-10-CM | POA: Insufficient documentation

## 2010-10-13 DIAGNOSIS — F17200 Nicotine dependence, unspecified, uncomplicated: Secondary | ICD-10-CM | POA: Insufficient documentation

## 2010-10-13 DIAGNOSIS — Z45018 Encounter for adjustment and management of other part of cardiac pacemaker: Secondary | ICD-10-CM | POA: Insufficient documentation

## 2010-10-13 LAB — SURGICAL PCR SCREEN
MRSA, PCR: NEGATIVE
Staphylococcus aureus: NEGATIVE

## 2010-10-13 LAB — CBC
HCT: 43.5 % (ref 39.0–52.0)
Hemoglobin: 15 g/dL (ref 13.0–17.0)
MCH: 33 pg (ref 26.0–34.0)
MCHC: 34.5 g/dL (ref 30.0–36.0)
MCV: 95.8 fL (ref 78.0–100.0)
Platelets: 146 10*3/uL — ABNORMAL LOW (ref 150–400)
RBC: 4.54 MIL/uL (ref 4.22–5.81)
RDW: 13.9 % (ref 11.5–15.5)
WBC: 8.3 10*3/uL (ref 4.0–10.5)

## 2010-10-14 ENCOUNTER — Encounter: Payer: Self-pay | Admitting: *Deleted

## 2010-10-24 ENCOUNTER — Ambulatory Visit (INDEPENDENT_AMBULATORY_CARE_PROVIDER_SITE_OTHER): Payer: 59 | Admitting: *Deleted

## 2010-10-24 ENCOUNTER — Ambulatory Visit: Payer: 59 | Admitting: *Deleted

## 2010-10-24 DIAGNOSIS — I4901 Ventricular fibrillation: Secondary | ICD-10-CM

## 2010-10-24 DIAGNOSIS — Z95810 Presence of automatic (implantable) cardiac defibrillator: Secondary | ICD-10-CM

## 2010-10-27 ENCOUNTER — Encounter: Payer: 59 | Admitting: *Deleted

## 2010-10-27 NOTE — Op Note (Signed)
Jeremy, [name]:  [id number]  MEDICAL RECORD NO.:  [id number]  LOCATION:  [facility]                         FACILITY:  [facility]  PHYSICIAN:  Duke Salvia, MD, FACCDATE OF BIRTH:  1950-07-15  DATE OF PROCEDURE:  10/13/2010 DATE OF DISCHARGE:                              OPERATIVE REPORT   PREOPERATIVE DIAGNOSIS:  Previously aborted cardiac arrest.  POSTOPERATIVE DIAGNOSIS:  Previously aborted cardiac arrest.  PROCEDURE:  Explantation of abdominal device, pocket revision, implantation of a new abdominal device, intraoperative defibrillation threshold testing.  Following obtaining informed consent, the patient was brought to the electrophysiology laboratory and placed on the fluoroscopic table in a supine position.  After routine prep and drape of the abdomen, an incision was made about half a centimeter caudal and parallel to the previous incision and carried down to layer of device pocket using sharp dissection and electrocautery.  The pocket was opened.  The leads were explanted.  There was a 6.1 defibrillator bifurcated lead and 3.1 rate/sense leads.  They were separated from the previously implanted device.  They were attached to a bipolar Oscor adapter, serial number [id number] was attached to the previously implanted rate/sense lead and a double bipolar configuration having been tested 6.8 with a pace impedance of 438 and threshold 1.8 at 0.5.  The previously implanted defibrillator leads were attached two Guidant adapters and I do not know the numbers of which how they were relate to the distal and proximal, they were [id number] and [id number], they both model 6931.  The were then attached in line to the defibrillator lead.  The pacing impedance of which had been 410 to the proximal coil of 408 through the distal coil.  We then attached the leads to a [employer] device, model E141, serial number [id number].  Through the device, bipolar R-wave  was 6.1 with a pace impedance of 435, with threshold 2.0 volts at 0.4 milliseconds.  High-voltage impedance was 64 ohms.  We then did a 1.1 joules commanded shock.  This was done in a synchronous way and the impedance was 44 ohms.  The pocket was revised to house the larger footprint device and the Aegis antimicrobial patch, this lot number was [id number]. Defibrillation threshold testing was then undertaken.  Ventricular fibrillation was induced via the T-wave shock.  After a total duration of 7 seconds, a 23 joules shock was delivered through measured resistance of 48 ohms, terminating ventricular fibrillation and restoring sinus rhythm.  This was undertaken in REVERSE POLARITY.  The device was implanted.  The pocket was copiously irrigated with antibiotic containing saline solution.  Hemostasis was assured.  The leads and pulse generator were placed into the pocket. Inside the Aegis, the pouch was secured to the prepectoral scar and the wound was then closed in three layers in normal fashion.  The wound was washed, dried, benzoin and Steri-Strip dressing was applied.  Needle counts, sponge counts, and instrument counts were correct at the end of the procedure according to staff.  The patient tolerated the procedure without apparent complication.     Duke Salvia, MD, [facility]     SCK/MEDQ  D:  10/13/2010  T:  10/13/2010  Job:  [id number]  Electronically Signed by Sherryl Manges MD [facility] on 10/27/2010 02:13:23 PM

## 2010-12-13 ENCOUNTER — Telehealth: Payer: Self-pay | Admitting: Internal Medicine

## 2010-12-13 NOTE — Telephone Encounter (Signed)
Pt's wife calling Nurse, mental health

## 2010-12-14 NOTE — Telephone Encounter (Signed)
Credit issue taken care of with the hospital.

## 2011-01-24 ENCOUNTER — Other Ambulatory Visit: Payer: Self-pay | Admitting: Internal Medicine

## 2011-02-01 ENCOUNTER — Other Ambulatory Visit: Payer: Self-pay | Admitting: Internal Medicine

## 2011-02-01 ENCOUNTER — Encounter: Payer: Self-pay | Admitting: Internal Medicine

## 2011-02-01 ENCOUNTER — Ambulatory Visit (INDEPENDENT_AMBULATORY_CARE_PROVIDER_SITE_OTHER): Payer: 59 | Admitting: Internal Medicine

## 2011-02-01 DIAGNOSIS — I4901 Ventricular fibrillation: Secondary | ICD-10-CM

## 2011-02-01 DIAGNOSIS — Z95810 Presence of automatic (implantable) cardiac defibrillator: Secondary | ICD-10-CM

## 2011-02-01 DIAGNOSIS — F17200 Nicotine dependence, unspecified, uncomplicated: Secondary | ICD-10-CM

## 2011-02-01 LAB — ICD DEVICE OBSERVATION
BRDY-0002RV: 40 {beats}/min
CHARGE TIME: 8.853 s
DEV-0020ICD: NEGATIVE
DEVICE MODEL ICD: 115351
HV IMPEDENCE: 64 Ohm
RV LEAD AMPLITUDE: 7.6 mv
RV LEAD IMPEDENCE ICD: 362 Ohm
RV LEAD THRESHOLD: 1.8 V
TOT-0001: 2
TOT-0002: 0

## 2011-02-01 NOTE — Assessment & Plan Note (Signed)
Still smoking

## 2011-02-01 NOTE — Progress Notes (Signed)
  HPI  Jeremy Walker is a 60 y.o. male Followup for a reported cardiac arrest that happened recurrently many years ago.  On both occasions he were associated with profound hypokalemia the cause of which was never elucidated. He is status post ICD implantation and prior generator replacement.he underwent generator replacement again June 2012  He denies chest pain and lower extremity claudication. He has chronic shortness of breath. He's not had any edema  Past Medical History  Diagnosis Date  . Sudden cardiac death     aborted  . Automatic implantable cardiac defibrillator     Abdominal implant was 0064 Guidant lead 6/5 mm  . Hypokalemia     Recurrent associated with cardiac arrest  . Tobacco abuse     Past Surgical History  Procedure Date  . Cardiac defibrillator placement     guidant    Current Outpatient Prescriptions  Medication Sig Dispense Refill  . atenolol (TENORMIN) 50 MG tablet TAKE ONE TABLET BY MOUTH EVERY DAY  90 tablet  4  . Multiple Vitamin (MULTIVITAMIN) capsule Take 1 capsule by mouth daily.          No Known Allergies  Review of Systems negative except from HPI and PMH  Physical Exam Well developed and well nourished in no acute distress With an odor of tobacco HENT normal E scleral and icterus clear Neck Supple JVP flat; carotids brisk and full Clear to ausculation But decreased breath sounds Abd pocket well healed  Soft with active bowel sounds No clubbing cyanosis and edema Alert and oriented, grossly normal motor and sensory function Decreased peripheral pulses Skin Warm and Dry   Assessment and  Plan

## 2011-02-01 NOTE — Assessment & Plan Note (Signed)
The patient's device was interrogated.  The information was reviewed. No changes were made in the programming.    

## 2011-02-01 NOTE — Assessment & Plan Note (Signed)
No recurrent arrhythmia. 

## 2011-02-01 NOTE — Patient Instructions (Signed)

## 2011-05-04 ENCOUNTER — Encounter: Payer: 59 | Admitting: *Deleted

## 2011-05-08 ENCOUNTER — Encounter: Payer: Self-pay | Admitting: *Deleted

## 2011-05-11 ENCOUNTER — Ambulatory Visit (INDEPENDENT_AMBULATORY_CARE_PROVIDER_SITE_OTHER): Payer: Self-pay | Admitting: *Deleted

## 2011-05-11 ENCOUNTER — Encounter: Payer: Self-pay | Admitting: Internal Medicine

## 2011-05-11 DIAGNOSIS — I4901 Ventricular fibrillation: Secondary | ICD-10-CM

## 2011-05-11 DIAGNOSIS — Z95810 Presence of automatic (implantable) cardiac defibrillator: Secondary | ICD-10-CM

## 2011-05-13 LAB — REMOTE ICD DEVICE
BRDY-0002RV: 40 {beats}/min
CHARGE TIME: 9 s
DEV-0020ICD: NEGATIVE
DEVICE MODEL ICD: 115351
FVT: 0
HV IMPEDENCE: 65 Ohm
PACEART VT: 0
RV LEAD AMPLITUDE: 7.9 mv
RV LEAD IMPEDENCE ICD: 348 Ohm
TOT-0006: 20121024000000
VENTRICULAR PACING ICD: 0 pct
VF: 0

## 2011-05-17 NOTE — Progress Notes (Signed)
Remote defib check  

## 2011-08-10 ENCOUNTER — Ambulatory Visit (INDEPENDENT_AMBULATORY_CARE_PROVIDER_SITE_OTHER): Payer: Self-pay | Admitting: *Deleted

## 2011-08-10 ENCOUNTER — Encounter: Payer: Self-pay | Admitting: Internal Medicine

## 2011-08-10 DIAGNOSIS — I4901 Ventricular fibrillation: Secondary | ICD-10-CM

## 2011-08-15 LAB — REMOTE ICD DEVICE
BRDY-0002RV: 40 {beats}/min
CHARGE TIME: 9 s
DEV-0020ICD: NEGATIVE
DEVICE MODEL ICD: 115351
FVT: 0
HV IMPEDENCE: 65 Ohm
PACEART VT: 0
RV LEAD AMPLITUDE: 7.2 mv
RV LEAD IMPEDENCE ICD: 351 Ohm
TOT-0006: 20130130000000
VENTRICULAR PACING ICD: 0 pct
VF: 0

## 2011-08-18 NOTE — Progress Notes (Signed)
ICD remote 

## 2011-08-29 ENCOUNTER — Encounter: Payer: Self-pay | Admitting: Internal Medicine

## 2011-11-16 ENCOUNTER — Encounter: Payer: Self-pay | Admitting: Internal Medicine

## 2011-11-16 ENCOUNTER — Ambulatory Visit (INDEPENDENT_AMBULATORY_CARE_PROVIDER_SITE_OTHER): Payer: Self-pay | Admitting: *Deleted

## 2011-11-16 DIAGNOSIS — I4901 Ventricular fibrillation: Secondary | ICD-10-CM

## 2011-11-16 DIAGNOSIS — Z95810 Presence of automatic (implantable) cardiac defibrillator: Secondary | ICD-10-CM

## 2011-11-19 LAB — REMOTE ICD DEVICE
BRDY-0002RV: 40 {beats}/min
CHARGE TIME: 9.1 s
DEV-0020ICD: NEGATIVE
DEVICE MODEL ICD: 115351
FVT: 0
HV IMPEDENCE: 64 Ohm
PACEART VT: 0
RV LEAD AMPLITUDE: 10.1 mv
RV LEAD IMPEDENCE ICD: 353 Ohm
TOT-0006: 20130502000000
VENTRICULAR PACING ICD: 0 pct
VF: 0

## 2011-11-23 ENCOUNTER — Encounter: Payer: Self-pay | Admitting: *Deleted

## 2012-01-24 ENCOUNTER — Encounter: Payer: Self-pay | Admitting: *Deleted

## 2012-02-01 ENCOUNTER — Encounter: Payer: Self-pay | Admitting: Internal Medicine

## 2012-02-01 ENCOUNTER — Ambulatory Visit (INDEPENDENT_AMBULATORY_CARE_PROVIDER_SITE_OTHER): Payer: Self-pay | Admitting: Internal Medicine

## 2012-02-01 VITALS — BP 132/74 | HR 55 | Ht 72.0 in | Wt 162.4 lb

## 2012-02-01 DIAGNOSIS — Z95810 Presence of automatic (implantable) cardiac defibrillator: Secondary | ICD-10-CM

## 2012-02-01 DIAGNOSIS — F17200 Nicotine dependence, unspecified, uncomplicated: Secondary | ICD-10-CM

## 2012-02-01 DIAGNOSIS — I4901 Ventricular fibrillation: Secondary | ICD-10-CM

## 2012-02-01 LAB — ICD DEVICE OBSERVATION
BRDY-0002RV: 40 {beats}/min
DEV-0020ICD: NEGATIVE
DEVICE MODEL ICD: 115351
HV IMPEDENCE: 64 Ohm
RV LEAD AMPLITUDE: 8.1 mv
RV LEAD IMPEDENCE ICD: 356 Ohm
RV LEAD THRESHOLD: 1.8 V
VENTRICULAR PACING ICD: 1 pct

## 2012-02-01 NOTE — Assessment & Plan Note (Signed)
The patient's device was interrogated.  The information was reviewed. No changes were made in the programming.    

## 2012-02-01 NOTE — Assessment & Plan Note (Signed)
We discussed strategies and cost of antismoking tools. We have given him the phone number for the [facility] as well as [facility] long program.

## 2012-02-01 NOTE — Patient Instructions (Signed)
Device clinic in 6 months

## 2012-02-01 NOTE — Progress Notes (Signed)
  HPI  Jeremy Walker is a 61 y.o. male Followup for a reported cardiac arrest that happened recurrently many years ago.  On both occasions he were associated with profound hypokalemia the cause of which was never elucidated. He is status post ICD implantation and prior generator replacement.he underwent generator replacement again June 2012  He denies chest pain and lower extremity claudication. He has chronic shortness of breath. He's not had any edema  He and his wife are interested in stopping smoking. Past Medical History  Diagnosis Date  . Sudden cardiac death     aborted  . Automatic implantable cardiac defibrillator     Abdominal implant was 0064 Guidant lead 6/5 mm  . Hypokalemia     Recurrent associated with cardiac arrest  . Tobacco abuse     Past Surgical History  Procedure Date  . Cardiac defibrillator placement     guidant    Current Outpatient Prescriptions  Medication Sig Dispense Refill  . atenolol (TENORMIN) 50 MG tablet TAKE ONE TABLET BY MOUTH EVERY DAY  90 tablet  4    No Known Allergies  Review of Systems negative except from HPI and PMH  Physical Exam BP 132/74  Pulse 55  Ht 6' (1.829 m)  Wt 162 lb 6.4 oz (73.664 kg)  BMI 22.03 kg/m2  Well developed and well nourished in no acute distress With an odor of tobacco HENT normal E scleral and icterus clear Neck Supple Clear to ausculation But decreased breath sounds Abd pocket well healed  Soft with active bowel sounds No clubbing cyanosis and edema Alert and oriented, grossly normal motor and sensory function Decreased peripheral pulses Skin Warm and Dry  ECG: Sinus Rhythm  @54             Intervals  17/09/44  Axis 43     Assessment and  Plan

## 2012-02-01 NOTE — Assessment & Plan Note (Signed)
No intercurrent Ventricular tachycardia  

## 2012-02-28 ENCOUNTER — Telehealth: Payer: Self-pay | Admitting: Internal Medicine

## 2012-02-28 MED ORDER — ATENOLOL 50 MG PO TABS: 50.0000 mg | ORAL_TABLET | Freq: Every day | ORAL | Status: DC

## 2012-02-28 NOTE — Telephone Encounter (Signed)
New Problem:     Patient's wife called in needing a refill of her husband's atenolol (TENORMIN) 50 MG tablet sent to the [facility] on [address] Please call back if you have any questions.

## 2012-07-17 ENCOUNTER — Ambulatory Visit (INDEPENDENT_AMBULATORY_CARE_PROVIDER_SITE_OTHER): Payer: BC Managed Care – PPO | Admitting: *Deleted

## 2012-07-17 ENCOUNTER — Other Ambulatory Visit: Payer: Self-pay

## 2012-07-17 DIAGNOSIS — I4901 Ventricular fibrillation: Secondary | ICD-10-CM

## 2012-07-17 LAB — ICD DEVICE OBSERVATION
BRDY-0002RV: 40 {beats}/min
DEV-0020ICD: NEGATIVE
DEVICE MODEL ICD: 115351
HV IMPEDENCE: 65 Ohm
RV LEAD AMPLITUDE: 9.6 mv
RV LEAD IMPEDENCE ICD: 354 Ohm
RV LEAD THRESHOLD: 1.5 V
VENTRICULAR PACING ICD: 1 pct

## 2012-07-17 NOTE — Progress Notes (Signed)
ICD check 

## 2012-08-09 ENCOUNTER — Encounter: Payer: Self-pay | Admitting: Internal Medicine

## 2012-12-26 ENCOUNTER — Encounter: Payer: Self-pay | Admitting: Internal Medicine

## 2013-01-28 ENCOUNTER — Encounter: Payer: BC Managed Care – PPO | Admitting: Internal Medicine

## 2013-02-06 ENCOUNTER — Ambulatory Visit (INDEPENDENT_AMBULATORY_CARE_PROVIDER_SITE_OTHER): Payer: BC Managed Care – PPO | Admitting: Internal Medicine

## 2013-02-06 ENCOUNTER — Encounter: Payer: Self-pay | Admitting: Internal Medicine

## 2013-02-06 VITALS — BP 124/68 | HR 63 | Ht 72.0 in | Wt 161.0 lb

## 2013-02-06 DIAGNOSIS — Z95810 Presence of automatic (implantable) cardiac defibrillator: Secondary | ICD-10-CM

## 2013-02-06 DIAGNOSIS — F17200 Nicotine dependence, unspecified, uncomplicated: Secondary | ICD-10-CM

## 2013-02-06 DIAGNOSIS — I4901 Ventricular fibrillation: Secondary | ICD-10-CM

## 2013-02-06 LAB — ICD DEVICE OBSERVATION
BRDY-0002RV: 40 {beats}/min
CHARGE TIME: 9.3 s
DEV-0020ICD: NEGATIVE
DEVICE MODEL ICD: 115351
HV IMPEDENCE: 66 Ohm
RV LEAD AMPLITUDE: 10.4 mv
RV LEAD IMPEDENCE ICD: 330 Ohm
RV LEAD THRESHOLD: 1.9 V
VENTRICULAR PACING ICD: 1 pct

## 2013-02-06 NOTE — Assessment & Plan Note (Signed)
Admonished again to stop smoking. He really wishes package of cigarettes. His wife is having surgery for an aortic aneurysm. They're considering more strictly stopping smoking

## 2013-02-06 NOTE — Progress Notes (Signed)
      Patient Care Team: Sheliah Hatch, MD as PCP - General (Family Medicine)   HPI  Jeremy Walker is a 62 y.o. male Followup for a reported cardiac arrest that happened recurrently many years ago. On both occasions he were associated with profound hypokalemia the cause of which was never elucidated. He is status post ICD implantation and prior generator replacement.he underwent generator replacement again June 2012  He denies chest pain and lower extremity claudication. He has chronic shortness of breath. He's not had any edema  He and his wife are interested in stopping smoking.  Past Medical History      Past Medical History  Diagnosis Date  . Sudden cardiac death     aborted  . Automatic implantable cardiac defibrillator     Abdominal implant was 0064 Guidant lead 6/5 mm  . Hypokalemia     Recurrent associated with cardiac arrest  . Tobacco abuse     Past Surgical History  Procedure Laterality Date  . Cardiac defibrillator placement      guidant    Current Outpatient Prescriptions  Medication Sig Dispense Refill  . atenolol (TENORMIN) 50 MG tablet Take 1 tablet (50 mg total) by mouth daily.  90 tablet  3   No current facility-administered medications for this visit.    No Known Allergies  Review of Systems negative except from HPI and PMH  Physical Exam BP 124/68  Pulse 63  Ht 6' (1.829 m)  Wt 161 lb (73.029 kg)  BMI 21.83 kg/m2  SpO2 97% Well developed and well nourished in no acute distress HENT normal E scleral and icterus clear Neck Supple JVP flat; carotids brisk and full Clear to ausculation  Regular rate and rhythm, no murmurs gallops or rub Soft with active bowel sounds No clubbing cyanosis none Edema Alert and oriented, grossly normal motor and sensory function Skin Warm and Dry  ECG NSR 16/09/42  Assessment and  Plan

## 2013-02-06 NOTE — Assessment & Plan Note (Signed)
The patient's device was interrogated.  The information was reviewed. No changes were made in the programming.    

## 2013-02-06 NOTE — Assessment & Plan Note (Signed)
No recurrent ventricular arrhythmia

## 2013-02-06 NOTE — Patient Instructions (Addendum)
Your physician recommends that you continue on your current medications as directed. Please refer to the Current Medication list given to you today.  Your physician wants you to follow-up in: 6 months with Brooke Edmisten, PA-C.  You will receive a reminder letter in the mail two months in advance. If you don't receive a letter, please call our office to schedule the follow-up appointment.  

## 2013-02-13 ENCOUNTER — Encounter: Payer: Self-pay | Admitting: Internal Medicine

## 2013-04-17 ENCOUNTER — Other Ambulatory Visit: Payer: Self-pay | Admitting: Internal Medicine

## 2013-04-21 ENCOUNTER — Ambulatory Visit: Payer: BC Managed Care – PPO | Admitting: Family Medicine

## 2013-07-31 ENCOUNTER — Other Ambulatory Visit: Payer: Self-pay | Admitting: Internal Medicine

## 2013-09-02 ENCOUNTER — Ambulatory Visit (INDEPENDENT_AMBULATORY_CARE_PROVIDER_SITE_OTHER): Payer: 59 | Admitting: Cardiology

## 2013-09-02 VITALS — BP 122/78 | HR 62 | Wt 169.0 lb

## 2013-09-02 DIAGNOSIS — I4901 Ventricular fibrillation: Secondary | ICD-10-CM

## 2013-09-02 DIAGNOSIS — Z95810 Presence of automatic (implantable) cardiac defibrillator: Secondary | ICD-10-CM

## 2013-09-02 NOTE — Patient Instructions (Addendum)
Your physician recommends that you keep your  scheduled  follow-up appointment 12/03/13 at 9:30 am [place] wants you to follow-up in: 1 year with Dr. Gari Crown will receive a reminder letter in the mail two months in advance. If you don't receive a letter, please call our office to schedule the follow-up appointment.   Your physician recommends that you continue on your current medications as directed. Please refer to the Current Medication list given to you today.

## 2013-09-02 NOTE — Progress Notes (Signed)
ELECTROPHYSIOLOGY OFFICE NOTE   Patient ID: Jeremy Walker MRN: [id number], DOB/AGE: [birth year]   Date of Visit: 09/02/2013  Primary Physician: Jeremy Asa, MD Primary Cardiologist / EP: Jeremy Nap, MD Reason for Visit: EP/device follow-up  History of Present Illness  Jeremy Walker is a 63 y.o. male who is s/p aborted cardiac arrest 20 years ago s/p single chamber ICD implantation. This was associated with profound hypokalemia, the cause of which was never elucidated. His most recent generator replacement was done June 2012.     He presents today for routine electrophysiology followup. Since last being seen in our clinic, he reports he is doing well and has no complaints. He has been in his usual state of health. He denies ICD shocks. He denies chest pain or shortness of breath. He denies palpitations, dizziness, near syncope or syncope. He denies LE swelling, orthopnea, PND or recent weight gain. He is compliant and tolerating medications without difficulty. He and his wife have quit smoking!  Past Medical History Past Medical History  Diagnosis Date  . Sudden cardiac death     aborted  . Automatic implantable cardiac defibrillator     Abdominal implant was 0064 Guidant lead 6/5 mm  . Hypokalemia     Recurrent associated with cardiac arrest  . Tobacco abuse     Past Surgical History Past Surgical History  Procedure Laterality Date  . Cardiac defibrillator placement      guidant    Allergies/Intolerances No Known Allergies  Current Home Medications Current Outpatient Prescriptions  Medication Sig Dispense Refill  . atenolol (TENORMIN) 50 MG tablet TAKE ONE TABLET BY MOUTH EVERY DAY  90 tablet  0   No current facility-administered medications for this visit.    Social History History   Social History  . Marital Status: Married    Spouse Name: N/A    Number of Children: N/A  . Years of Education: N/A   Occupational History  . Not on file.   Social History  Main Topics  . Smoking status: Current Every Day Smoker -- 1.50 packs/day for 42 years    Types: Cigarettes  . Smokeless tobacco: Never Used  . Alcohol Use: 10.5 oz/week    21 drink(s) per week  . Drug Use: No  . Sexual Activity: Not on file   Other Topics Concern  . Not on file   Social History Narrative  . No narrative on file     Review of Systems General: No chills, fever, night sweats or weight changes Cardiovascular: No chest pain, dyspnea on exertion, edema, orthopnea, palpitations, paroxysmal nocturnal dyspnea Dermatological: No rash, lesions or masses Respiratory: No cough, dyspnea Urologic: No hematuria, dysuria Abdominal: No nausea, vomiting, diarrhea, bright red blood per rectum, melena, or hematemesis Neurologic: No visual changes, weakness, changes in mental status All other systems reviewed and are otherwise negative except as noted above.  Physical Exam Vitals: Blood pressure 122/78, pulse 62, weight 169 lb (76.658 kg).  General: Well developed, well appearing 63 y.o. male in no acute distress. HEENT: Normocephalic, atraumatic. EOMs intact. Sclera nonicteric. Oropharynx clear.  Neck: Supple. No JVD. Lungs: Respirations regular and unlabored, CTA bilaterally. No wheezes, rales or rhonchi. Heart: RRR. S1, S2 present. No murmurs, rub, S3 or S4. Abdomen: Soft, non-distended. Extremities: No clubbing, cyanosis or edema. PT/Radials 2+ and equal bilaterally. Psych: Normal affect. Neuro: Alert and oriented X 3. Moves all extremities spontaneously.   Diagnostics 12-lead ECG today - NSR at 62 bpm with normal intervals; PR 166  QRS 88 QTc 436 Device interrogation today - Abdominal implant. Normal device function. Thresholds and sensing consistent with previous device measurements. RV pacing threshold chronically high but stable. Impedance trends stable over time. 1 NST episode, 2 seconds in duration, EGM reviewed and consistent with NSVT.  Otherwise no evidence of any  ventricular arrhythmias since last reset Oct 2014. Histogram distribution appropriate for patient and level of activity. No changes made this session. Device programmed at appropriate safety margins. Device programmed to optimize intrinsic conduction. Estimated longevity 11.5 years.   Assessment and Plan Aborted cardiac arrest s/p ICD implantation - normal device function - no programming changes made - counseled regarding the importance of remote monitoring; however, [name] declines and states he prefers office visits only - return to device clinic for ICD check in 3 months - return for follow-up with Dr. Caryl Walker in one year - congratulated him and his wife for smoking cessation!  Jeremy Huntsman, PA-C 09/02/2013, 4:46 PM

## 2013-09-04 LAB — MDC_IDC_ENUM_SESS_TYPE_INCLINIC
Battery Remaining Longevity: 11.5
Brady Statistic RV Percent Paced: 0 %
HIGH POWER IMPEDANCE MEASURED VALUE: 66 Ohm
Implantable Pulse Generator Serial Number: 115351
Lead Channel Impedance Value: 355 Ohm
Lead Channel Pacing Threshold Amplitude: 2.1 V
Lead Channel Sensing Intrinsic Amplitude: 10.2 mV
Lead Channel Setting Pacing Amplitude: 3 V
Lead Channel Setting Pacing Pulse Width: 1 ms
MDC IDC MSMT LEADCHNL RV PACING THRESHOLD PULSEWIDTH: 1 ms
Zone Setting Detection Interval: 285.7 ms

## 2013-09-08 ENCOUNTER — Encounter: Payer: Self-pay | Admitting: Cardiology

## 2013-10-07 ENCOUNTER — Telehealth: Payer: Self-pay

## 2013-10-07 NOTE — Telephone Encounter (Signed)
New Patient Previous PCP:  Pt states he has not seen a primary care provider in almost 30 years.   Medication List and allergies:  Updated and Reviewed  90 day supply/mail order: n/a Local prescriptions:  [facility] - [place], [place] - [address]  Immunization due:    A/P: No changes to personal, family or PSH Tdap- unsure of last vaccine Shingles- has never had CCS- has never had one PSA- not on file    To discuss with provider: Pt would like to keep Dr. Caryl Comes as his cardiologist.

## 2013-10-08 ENCOUNTER — Other Ambulatory Visit: Payer: Self-pay | Admitting: Family Medicine

## 2013-10-08 ENCOUNTER — Ambulatory Visit (INDEPENDENT_AMBULATORY_CARE_PROVIDER_SITE_OTHER): Payer: 59 | Admitting: Family Medicine

## 2013-10-08 ENCOUNTER — Encounter: Payer: Self-pay | Admitting: Family Medicine

## 2013-10-08 VITALS — BP 126/72 | HR 56 | Temp 97.6°F | Resp 20 | Ht 71.25 in | Wt 180.4 lb

## 2013-10-08 DIAGNOSIS — I4901 Ventricular fibrillation: Secondary | ICD-10-CM

## 2013-10-08 DIAGNOSIS — K219 Gastro-esophageal reflux disease without esophagitis: Secondary | ICD-10-CM

## 2013-10-08 DIAGNOSIS — F17200 Nicotine dependence, unspecified, uncomplicated: Secondary | ICD-10-CM

## 2013-10-08 DIAGNOSIS — R7989 Other specified abnormal findings of blood chemistry: Secondary | ICD-10-CM

## 2013-10-08 DIAGNOSIS — R945 Abnormal results of liver function studies: Principal | ICD-10-CM

## 2013-10-08 LAB — BASIC METABOLIC PANEL
BUN: 14 mg/dL (ref 6–23)
CO2: 27 meq/L (ref 19–32)
CREATININE: 0.8 mg/dL (ref 0.4–1.5)
Calcium: 9.2 mg/dL (ref 8.4–10.5)
Chloride: 104 mEq/L (ref 96–112)
GFR: 99.68 mL/min (ref >60.00)
Glucose, Bld: 99 mg/dL (ref 70–99)
Potassium: 4 mEq/L (ref 3.5–5.1)
Sodium: 138 mEq/L (ref 135–145)

## 2013-10-08 LAB — LIPID PANEL
Cholesterol: 220 mg/dL — ABNORMAL HIGH (ref 0–200)
HDL: 58 mg/dL (ref >39.00)
LDL Cholesterol: 142 mg/dL — ABNORMAL HIGH (ref 0–99)
NONHDL: 162
Total CHOL/HDL Ratio: 4
Triglycerides: 100 mg/dL (ref 0.0–149.0)
VLDL: 20 mg/dL (ref 0.0–40.0)

## 2013-10-08 LAB — CBC WITH DIFFERENTIAL/PLATELET
BASOS PCT: 0.5 % (ref 0.0–3.0)
Basophils Absolute: 0 10*3/uL (ref 0.0–0.1)
EOS PCT: 1.2 % (ref 0.0–5.0)
Eosinophils Absolute: 0.1 10*3/uL (ref 0.0–0.7)
HCT: 42.8 % (ref 39.0–52.0)
Hemoglobin: 14.2 g/dL (ref 13.0–17.0)
Lymphocytes Relative: 35 % (ref 12.0–46.0)
Lymphs Abs: 2.3 10*3/uL (ref 0.7–4.0)
MCHC: 33.1 g/dL (ref 30.0–36.0)
MCV: 94.6 fl (ref 78.0–100.0)
MONO ABS: 0.4 10*3/uL (ref 0.1–1.0)
MONOS PCT: 6.7 % (ref 3.0–12.0)
NEUTROS PCT: 56.6 % (ref 43.0–77.0)
Neutro Abs: 3.7 10*3/uL (ref 1.4–7.7)
PLATELETS: 144 10*3/uL — AB (ref 150.0–400.0)
RBC: 4.53 Mil/uL (ref 4.22–5.81)
RDW: 13.9 % (ref 11.5–15.5)
WBC: 6.6 10*3/uL (ref 4.0–10.5)

## 2013-10-08 LAB — HEPATIC FUNCTION PANEL
ALBUMIN: 4.1 g/dL (ref 3.5–5.2)
ALT: 97 U/L — AB (ref 0–53)
AST: 58 U/L — ABNORMAL HIGH (ref 0–37)
Alkaline Phosphatase: 93 U/L (ref 39–117)
Bilirubin, Direct: 0.1 mg/dL (ref 0.0–0.3)
Total Bilirubin: 0.6 mg/dL (ref 0.2–1.2)
Total Protein: 7.3 g/dL (ref 6.0–8.3)

## 2013-10-08 LAB — TSH: TSH: 0.74 u[IU]/mL (ref 0.35–4.50)

## 2013-10-08 NOTE — Patient Instructions (Signed)
Schedule your complete physical in 6 months We'll notify you of your lab results and make any changes if needed Congrats on quitting smoking! Call with any questions or concerns Welcome!  We're glad to have you!!

## 2013-10-08 NOTE — Assessment & Plan Note (Signed)
New to provider.  Pt having good relief w/ OTC omeprazole.  No changes at this time.  Reviewed dietary and lifestyle modifications.

## 2013-10-08 NOTE — Assessment & Plan Note (Signed)
QUIT as of March 2015.  Applauded pt's efforts.  Has extensive hx and is at higher risk for vascular and pulmonary issues due to this.  Check labs to risk stratify.  Will follow.

## 2013-10-08 NOTE — Assessment & Plan Note (Signed)
New to provider, chronic for pt.  Following w/ Dr Caryl Comes- referral entered as per insurance requirements.  Check labs.  Will follow along and assist as able.

## 2013-10-08 NOTE — Progress Notes (Signed)
Pre visit review using our clinic review tool, if applicable. No additional management support is needed unless otherwise documented below in the visit note. 

## 2013-10-08 NOTE — Progress Notes (Signed)
   Subjective:    Patient ID: Jeremy Walker, male    DOB: 10/19/1950, 63 y.o.   MRN: [id number]  HPI New to establish.  No Previous PCP.  VFib- pt was shooting pool 20 yrs ago and 'I just went out'.  Went to [place], had negative work up.  Had 2nd episode and got ICD.  Pt now following w/ Dr Caryl Comes.  ICD 'went off a couple of time when it first went in' and has not gone off since.  No CP, SOB, HAs, visual changes.  On Atenolol.  Former tobacco user- quit smoking March 2015!  GERD- chronic problem, on Omeprazole w/ good relief.  Health maintenance- pt refusing colonoscopy, due for Tdap and shingles.  Review of Systems For ROS see HPI     Objective:   Physical Exam  Vitals reviewed. Constitutional: He is oriented to person, place, and time. He appears well-developed and well-nourished. No distress.  HENT:  Head: Normocephalic and atraumatic.  Eyes: Conjunctivae and EOM are normal. Pupils are equal, round, and reactive to light.  Neck: Normal range of motion. Neck supple. No thyromegaly present.  Cardiovascular: Normal rate, regular rhythm, normal heart sounds and intact distal pulses.   No murmur heard. Pulmonary/Chest: Effort normal and breath sounds normal. No respiratory distress.  Abdominal: Soft. Bowel sounds are normal. He exhibits no distension.  Musculoskeletal: He exhibits no edema.  Lymphadenopathy:    He has no cervical adenopathy.  Neurological: He is alert and oriented to person, place, and time. No cranial nerve deficit.  Skin: Skin is warm and dry.  Psychiatric: He has a normal mood and affect. His behavior is normal.          Assessment & Plan:

## 2013-10-14 ENCOUNTER — Encounter: Payer: Self-pay | Admitting: Internal Medicine

## 2013-10-22 ENCOUNTER — Other Ambulatory Visit (INDEPENDENT_AMBULATORY_CARE_PROVIDER_SITE_OTHER): Payer: 59

## 2013-10-22 DIAGNOSIS — R7989 Other specified abnormal findings of blood chemistry: Secondary | ICD-10-CM

## 2013-10-22 DIAGNOSIS — R945 Abnormal results of liver function studies: Principal | ICD-10-CM

## 2013-10-22 LAB — HEPATIC FUNCTION PANEL
ALK PHOS: 92 U/L (ref 39–117)
ALT: 73 U/L — ABNORMAL HIGH (ref 0–53)
AST: 43 U/L — AB (ref 0–37)
Albumin: 4 g/dL (ref 3.5–5.2)
BILIRUBIN DIRECT: 0.1 mg/dL (ref 0.0–0.3)
BILIRUBIN TOTAL: 0.6 mg/dL (ref 0.2–1.2)
TOTAL PROTEIN: 6.8 g/dL (ref 6.0–8.3)

## 2013-10-23 ENCOUNTER — Other Ambulatory Visit: Payer: Self-pay | Admitting: General Practice

## 2013-10-23 MED ORDER — SIMVASTATIN 20 MG PO TABS: 20.0000 mg | ORAL_TABLET | Freq: Every day | ORAL | Status: DC

## 2013-11-15 ENCOUNTER — Other Ambulatory Visit: Payer: Self-pay | Admitting: Internal Medicine

## 2013-12-03 ENCOUNTER — Ambulatory Visit (INDEPENDENT_AMBULATORY_CARE_PROVIDER_SITE_OTHER): Payer: 59 | Admitting: *Deleted

## 2013-12-03 DIAGNOSIS — I4901 Ventricular fibrillation: Secondary | ICD-10-CM

## 2013-12-03 LAB — MDC_IDC_ENUM_SESS_TYPE_INCLINIC
Brady Statistic RV Percent Paced: 1 %
Date Time Interrogation Session: 20150826040000
HIGH POWER IMPEDANCE MEASURED VALUE: 48 Ohm
HIGH POWER IMPEDANCE MEASURED VALUE: 69 Ohm
Implantable Pulse Generator Serial Number: 115351
Lead Channel Impedance Value: 348 Ohm
Lead Channel Pacing Threshold Amplitude: 1.7 V
Lead Channel Pacing Threshold Pulse Width: 1 ms
Lead Channel Setting Pacing Amplitude: 3 V
Lead Channel Setting Pacing Pulse Width: 1 ms
Lead Channel Setting Sensing Sensitivity: 0.4 mV
MDC IDC MSMT LEADCHNL RV SENSING INTR AMPL: 8.3 mV
MDC IDC SET ZONE DETECTION INTERVAL: 286 ms

## 2013-12-03 NOTE — Progress Notes (Signed)
ICD check in clinic. Normal device function. Thresholds and sensing consistent with previous device measurements. Impedance trends stable over time. 1 NSVT.  Histogram distribution appropriate for patient and level of activity. No changes made this session. Device programmed at appropriate safety margins. Device programmed to optimize intrinsic conduction. Estimated longevity 11.5 years.  Patient education completed including shock plan. Alert tones/vibration demonstrated for patient.  ROV 6 months with Dr. Caryl Comes.

## 2013-12-04 ENCOUNTER — Ambulatory Visit (INDEPENDENT_AMBULATORY_CARE_PROVIDER_SITE_OTHER): Payer: 59 | Admitting: Family Medicine

## 2013-12-04 ENCOUNTER — Encounter: Payer: Self-pay | Admitting: General Practice

## 2013-12-04 ENCOUNTER — Encounter: Payer: Self-pay | Admitting: Family Medicine

## 2013-12-04 VITALS — BP 140/84 | HR 49 | Temp 97.9°F | Resp 17 | Wt 184.2 lb

## 2013-12-04 DIAGNOSIS — R143 Flatulence: Secondary | ICD-10-CM

## 2013-12-04 DIAGNOSIS — R141 Gas pain: Secondary | ICD-10-CM

## 2013-12-04 DIAGNOSIS — R140 Abdominal distension (gaseous): Secondary | ICD-10-CM | POA: Insufficient documentation

## 2013-12-04 DIAGNOSIS — R142 Eructation: Secondary | ICD-10-CM

## 2013-12-04 LAB — HEPATIC FUNCTION PANEL
ALBUMIN: 3.9 g/dL (ref 3.5–5.2)
ALK PHOS: 87 U/L (ref 39–117)
ALT: 39 U/L (ref 0–53)
AST: 32 U/L (ref 0–37)
Bilirubin, Direct: 0.1 mg/dL (ref 0.0–0.3)
TOTAL PROTEIN: 6.8 g/dL (ref 6.0–8.3)
Total Bilirubin: 0.8 mg/dL (ref 0.2–1.2)

## 2013-12-04 LAB — BASIC METABOLIC PANEL
BUN: 11 mg/dL (ref 6–23)
CALCIUM: 8.9 mg/dL (ref 8.4–10.5)
CHLORIDE: 105 meq/L (ref 96–112)
CO2: 27 mEq/L (ref 19–32)
CREATININE: 1 mg/dL (ref 0.4–1.5)
GFR: 80.35 mL/min (ref >60.00)
Glucose, Bld: 110 mg/dL — ABNORMAL HIGH (ref 70–99)
Potassium: 3.8 mEq/L (ref 3.5–5.1)
SODIUM: 138 meq/L (ref 135–145)

## 2013-12-04 LAB — H. PYLORI ANTIBODY, IGG: H Pylori IgG: NEGATIVE

## 2013-12-04 MED ORDER — OMEPRAZOLE 20 MG PO CPDR: 20.0000 mg | DELAYED_RELEASE_CAPSULE | Freq: Every day | ORAL | Status: DC

## 2013-12-04 MED ORDER — ALUM HYDROXIDE-MAG CARBONATE 95-358 MG/15ML PO SUSP: 30.0000 mL | Freq: Three times a day (TID) | ORAL | Status: DC

## 2013-12-04 MED ORDER — ALUM HYDROXIDE-MAG CARBONATE 95-358 MG/15ML PO SUSP
30.0000 mL | Freq: Three times a day (TID) | ORAL | Status: DC
Start: 2013-12-04 — End: 2015-04-23

## 2013-12-04 NOTE — Progress Notes (Signed)
   Subjective:    Patient ID: Jeremy Walker, male    DOB: 1950/07/07, 63 y.o.   MRN: [id number]  HPI GERD- chronic problem.  OTC omeprazole is controlling GERD but causing increased gas.  Pt still having increased belching, wife is reporting this keeps her up at night.  Pt got Gaviscon w/ mild improvement in gas.  Pt is worried that Gaviscon would interact w/ meds.   Review of Systems For ROS see HPI     Objective:   Physical Exam  Vitals reviewed. Constitutional: He is oriented to person, place, and time. He appears well-developed and well-nourished. No distress.  Cardiovascular: Normal rate, regular rhythm and normal heart sounds.   Pulmonary/Chest: Effort normal and breath sounds normal. No respiratory distress. He has no wheezes. He has no rales.  Abdominal: Soft. Bowel sounds are normal. He exhibits no distension. There is no tenderness. There is no rebound.  Neurological: He is alert and oriented to person, place, and time.          Assessment & Plan:

## 2013-12-04 NOTE — Progress Notes (Signed)
Pre visit review using our clinic review tool, if applicable. No additional management support is needed unless otherwise documented below in the visit note. 

## 2013-12-04 NOTE — Assessment & Plan Note (Signed)
New.  See above.  Pt's GERD is well controlled on Omeprazole- no change to PPI at this time.  Pt expressed understanding and is in agreement w/ plan.

## 2013-12-04 NOTE — Assessment & Plan Note (Signed)
New.  Suspect this is due to increased gas.  Check labs to r/o H pylori or biliary issues.  Pt can safely take Gaviscon or Beano for symptom relief.  Will follow.

## 2013-12-04 NOTE — Patient Instructions (Signed)
Follow up as needed Continue the Omeprazole daily Use the Gaviscon as needed Take Beano daily to prevent gas We'll notify you of your lab results and make any changes if needed Call with any questions or concerns Hang in there!

## 2013-12-31 ENCOUNTER — Telehealth: Payer: Self-pay | Admitting: Family Medicine

## 2013-12-31 MED ORDER — PANTOPRAZOLE SODIUM 40 MG PO TBEC: 40.0000 mg | DELAYED_RELEASE_TABLET | Freq: Every day | ORAL | Status: DC

## 2013-12-31 NOTE — Telephone Encounter (Signed)
Medication filled, pt notified and chart updated.

## 2013-12-31 NOTE — Telephone Encounter (Signed)
Ok to switch to [employer] 40mg  daily, #30, 6 refills

## 2013-12-31 NOTE — Telephone Encounter (Signed)
Caller name:Stevison,Diane Relation to pt: self  Call back number: [phone] [facility] [phone]    Reason for call:   omeprazole (PRILOSEC) 20 MG capsule is not working pt is burping at night cant sleep, wife gives pt her pantoprazole (PROTONIX) 40 MG tablet and has better results. Spouse would like medication to be changed to pantoprazole (PROTONIX) 40 MG tablet please advise

## 2014-01-13 ENCOUNTER — Ambulatory Visit (INDEPENDENT_AMBULATORY_CARE_PROVIDER_SITE_OTHER): Payer: 59 | Admitting: Medical

## 2014-01-13 ENCOUNTER — Encounter: Payer: Self-pay | Admitting: Medical

## 2014-01-13 VITALS — BP 121/76 | HR 57 | Temp 97.8°F | Ht 71.0 in | Wt 186.6 lb

## 2014-01-13 DIAGNOSIS — R140 Abdominal distension (gaseous): Secondary | ICD-10-CM

## 2014-01-13 NOTE — Patient Instructions (Addendum)
For your reflux and belching, I want you to continue protonix but stop coffee all together. Can try tea as replacement. If symptom persist consider referral to GI. Also we discussed short trial course of zantac but let me know in one week if belching is persisting after stopping coffee. You could use gas-x or phazyme..  Follow up in 2 weeks or as needed.

## 2014-01-13 NOTE — Assessment & Plan Note (Signed)
Pt reflux controlled but has a lot belching after eating and lying down. I advised him to continue protonix presently. I asked him to stop his coffee completley and use tea in place of. Improve his diet overall. Give [place] update in one week to see if improved. Can use gas-x or phazyme as well. He may need referal to GI for egd in the future.

## 2014-01-13 NOTE — Progress Notes (Signed)
Subjective:    Patient ID: Jeremy Walker, male    DOB: 11-19-1950, 63 y.o.   MRN: [id number]  HPI  Pt states he has history of recent reflux early June. He states omeprazole helped initially and then he was put on protonix. He is not having any reflux know but what he does experience is a lot of burping. He will eat something and will burp. Pt drinks about 1.5 cup of coffee. Avoiding fried foods. Pt not smoking.  Past Medical History  Diagnosis Date  . Sudden cardiac death     aborted  . Automatic implantable cardiac defibrillator     Abdominal implant was 0064 Guidant lead 6/5 mm  . Hypokalemia     Recurrent associated with cardiac arrest  . Tobacco abuse     History   Social History  . Marital Status: Married    Spouse Name: N/A    Number of Children: N/A  . Years of Education: N/A   Occupational History  . Not on file.   Social History Main Topics  . Smoking status: Former Smoker -- 1.50 packs/day for 42 years    Types: Cigarettes  . Smokeless tobacco: Never Used  . Alcohol Use: 10.5 oz/week    21 drink(s) per week  . Drug Use: No  . Sexual Activity: Not on file   Other Topics Concern  . Not on file   Social History Narrative  . No narrative on file    Past Surgical History  Procedure Laterality Date  . Cardiac defibrillator placement      guidant    Family History  Problem Relation Age of Onset  . Lung cancer Brother     smoker  . Cancer Father   . Parkinson's disease Mother   . Scoliosis Mother     No Known Allergies  Current Outpatient Prescriptions on File Prior to Visit  Medication Sig Dispense Refill  . aluminum hydroxide-magnesium carbonate (GAVISCON) 95-358 MG/15ML SUSP Take 30 mLs by mouth 4 (four) times daily - after meals and at bedtime.  355 mL  3  . atenolol (TENORMIN) 50 MG tablet TAKE ONE TABLET BY MOUTH ONCE DAILY  90 tablet  1  . pantoprazole (PROTONIX) 40 MG tablet Take 1 tablet (40 mg total) by mouth daily.  30 tablet  6  .  simvastatin (ZOCOR) 20 MG tablet Take 1 tablet (20 mg total) by mouth at bedtime.  90 tablet  1   No current facility-administered medications on file prior to visit.    BP 121/76  Pulse 57  Temp(Src) 97.8 F (36.6 C) (Oral)  Ht 5\' 11"  (1.803 m)  Wt 186 lb 9.6 oz (84.641 kg)  BMI 26.04 kg/m2  SpO2 97%     Review of Systems  Constitutional: Negative for fever, chills and fatigue.  HENT: Negative.   Respiratory: Negative for cough, chest tightness, shortness of breath and wheezing.   Cardiovascular: Negative for chest pain and palpitations.  Gastrointestinal: Negative for nausea, vomiting, abdominal pain, diarrhea, constipation, blood in stool, abdominal distention, anal bleeding and rectal pain.       A lot of belching.  Genitourinary: Negative.   Musculoskeletal: Negative for arthralgias, back pain, joint swelling, myalgias and neck stiffness.  Skin: Negative for color change, pallor, rash and wound.  Neurological: Negative.   Hematological: Negative for adenopathy. Does not bruise/bleed easily.  Psychiatric/Behavioral: Negative.        Objective:   Physical Exam  General Appearance- Not in acute distress.  HEENT Eyes- Scleraeral/Conjuntiva-bilat- Not Yellow. Mouth & Throat- Normal.  Chest and Lung Exam Auscultation: Breath sounds:-Normal. Adventitious sounds:- No Adventitious sounds.  Cardiovascular Auscultation:Rythm - Regular. Heart Sounds -Normal heart sounds.  Abdomen Inspection:-Inspection Normal.  Palpation/Perucssion: Palpation and Percussion of the abdomen reveal- Non Tender, No Rebound tenderness, No rigidity(Guarding) and No Palpable abdominal masses.  Liver:-Normal.  Spleen:- Normal.          Assessment & Plan:

## 2014-01-20 ENCOUNTER — Encounter: Payer: Self-pay | Admitting: Family Medicine

## 2014-01-20 ENCOUNTER — Encounter: Payer: Self-pay | Admitting: Gastroenterology

## 2014-01-20 DIAGNOSIS — R140 Abdominal distension (gaseous): Secondary | ICD-10-CM

## 2014-01-20 NOTE — Progress Notes (Signed)
Referral order placed.

## 2014-01-20 NOTE — Progress Notes (Signed)
Ok to place GI referral. 

## 2014-01-22 ENCOUNTER — Ambulatory Visit: Payer: 59 | Admitting: Family Medicine

## 2014-01-29 ENCOUNTER — Encounter: Payer: Self-pay | Admitting: Gastroenterology

## 2014-01-29 ENCOUNTER — Ambulatory Visit (INDEPENDENT_AMBULATORY_CARE_PROVIDER_SITE_OTHER): Payer: 59 | Admitting: Gastroenterology

## 2014-01-29 VITALS — BP 132/80 | HR 60 | Ht 71.0 in | Wt 186.4 lb

## 2014-01-29 DIAGNOSIS — R142 Eructation: Secondary | ICD-10-CM

## 2014-01-29 DIAGNOSIS — R7989 Other specified abnormal findings of blood chemistry: Secondary | ICD-10-CM

## 2014-01-29 DIAGNOSIS — K219 Gastro-esophageal reflux disease without esophagitis: Secondary | ICD-10-CM

## 2014-01-29 DIAGNOSIS — R945 Abnormal results of liver function studies: Secondary | ICD-10-CM

## 2014-01-29 DIAGNOSIS — Z1211 Encounter for screening for malignant neoplasm of colon: Secondary | ICD-10-CM

## 2014-01-29 MED ORDER — PANTOPRAZOLE SODIUM 40 MG PO TBEC: 40.0000 mg | DELAYED_RELEASE_TABLET | Freq: Two times a day (BID) | ORAL | Status: DC

## 2014-01-29 MED ORDER — MOVIPREP 100 G PO SOLR: 1.0000 | Freq: Once | ORAL | Status: DC

## 2014-01-29 NOTE — Patient Instructions (Addendum)
You have been scheduled for an abdominal ultrasound at [facility] (1st floor of hospital) on 02-03-2014 at 9 am. Please arrive 15 minutes prior to your appointment for registration. Make certain not to have anything to eat or drink 6 hours prior to your appointment. Should you need to reschedule your appointment, please contact radiology at [phone]. This test typically takes about 30 minutes to perform.  You have been scheduled for a colonoscopy. Please follow written instructions given to you at your visit today.  Please pick up your prep kit at the pharmacy within the next 1-3 days. If you use inhalers (even only as needed), please bring them with you on the day of your procedure. Your physician has requested that you go to www.startemmi.com and enter the access code given to you at your visit today. This web site gives a general overview about your procedure. However, you should still follow specific instructions given to you by our office regarding your preparation for the procedure.  Please increase your Pantoprazole to one capsule twice daily  New prescription was sent to your pharmacy

## 2014-01-30 ENCOUNTER — Encounter: Payer: Self-pay | Admitting: Gastroenterology

## 2014-01-30 DIAGNOSIS — R142 Eructation: Secondary | ICD-10-CM | POA: Insufficient documentation

## 2014-01-30 DIAGNOSIS — Z1211 Encounter for screening for malignant neoplasm of colon: Secondary | ICD-10-CM | POA: Insufficient documentation

## 2014-01-30 DIAGNOSIS — R7989 Other specified abnormal findings of blood chemistry: Secondary | ICD-10-CM | POA: Insufficient documentation

## 2014-01-30 DIAGNOSIS — R945 Abnormal results of liver function studies: Secondary | ICD-10-CM

## 2014-01-30 NOTE — Progress Notes (Addendum)
01/30/2014 Jeremy Walker [id number] 31-Jan-1951   HISTORY OF PRESENT ILLNESS:  This is a 63 year old male who has PMH of defibrillator placement after a near sudden cardiac death experience in 06-13-89 that was associated with recurrent hypokalemia, HLD, and anxiety/depression.  He presents to our office today with complaints of belching and reflux that has been present for the past several months.  He says that has been experiencing constant belching and acid reflux coming up into his throat.  He says that the belching occurs every time that he eats or drinks anything, even water.  He says that the belching gets worse when lying down at night as well and keeps him from sleeping.  He's tried to eliminate coffee and other foods, but it has not made a difference in his symptoms.  He started daily PPI and this has helped the reflux but has not helped the belching.  Also complains of 20 pound weight gain since June; says that all of the weight gain is in his abdomen and it feels bloated and hard.  No improvement with Gaviscon and Beano.  Checked for Hpylori by PCP and was negative.  BMP was normal.  LFT's were normal as well, but were noted to be elevated 3 months ago with AST 58 and ALT 97 but ALP and total bili were were normal.    He's never undergone colonoscopy in the past.  He denies any rectal bleeding or bowel issues.  No family history of colon cancer.    Past Medical History  Diagnosis Date  . Sudden cardiac death     aborted  . Automatic implantable cardiac defibrillator     Abdominal implant was 0064 Guidant lead 6/5 mm  . Hypokalemia     Recurrent associated with cardiac arrest  . Tobacco abuse   . Anxiety   . Asthma   . Depression   . HLD (hyperlipidemia)    Past Surgical History  Procedure Laterality Date  . Cardiac defibrillator placement  1991    guidant  . Hand surgery Left     reports that he quit smoking about 21 months ago. His smoking use included Cigarettes. He has  a 63 pack-year smoking history. He has never used smokeless tobacco. He reports that he does not drink alcohol or use illicit drugs. family history includes Cancer in his father; Diabetes in his brother, maternal aunt, and mother; Heart failure in his mother; Lung cancer in his brother; Ovarian cancer in his mother; Parkinson's disease in his mother; Scoliosis in his mother. No Known Allergies    Outpatient Encounter Prescriptions as of 01/29/2014  Medication Sig  . Alpha-D-Galactosidase (BEANO) TABS Take by mouth once.  Marland Kitchen aluminum hydroxide-magnesium carbonate (GAVISCON) 95-358 MG/15ML SUSP Take 30 mLs by mouth 4 (four) times daily - after meals and at bedtime.  Marland Kitchen atenolol (TENORMIN) 50 MG tablet TAKE ONE TABLET BY MOUTH ONCE DAILY  . pantoprazole (PROTONIX) 40 MG tablet Take 1 tablet (40 mg total) by mouth 2 (two) times daily.  . simvastatin (ZOCOR) 20 MG tablet Take 1 tablet (20 mg total) by mouth at bedtime.  . [DISCONTINUED] pantoprazole (PROTONIX) 40 MG tablet Take 1 tablet (40 mg total) by mouth daily.  Marland Kitchen MOVIPREP 100 G SOLR Take 1 kit (200 g total) by mouth once.     REVIEW OF SYSTEMS  : All other systems reviewed and negative except where noted in the History of Present Illness.   PHYSICAL EXAM: BP 132/80  Pulse 60  Ht '5\' 11"'  (1.803 m)  Wt 186 lb 6 oz (84.539 kg)  BMI 26.01 kg/m2 General: Well developed white male in no acute distress Head: Normocephalic and atraumatic Eyes:  Sclerae anicteric, conjunctiva pink. Ears: Normal auditory acuity Lungs: Clear throughout to auscultation Heart: Regular rate and rhythm Abdomen: Soft, non-distended.  Normal bowel sounds.  Non-tender. Rectal:  Will be done at the time of colonoscopy. Musculoskeletal: Symmetrical with no gross deformities  Skin: No lesions on visible extremities Extremities: No edema  Neurological: Alert oriented x 4, grossly non-focal Psychological:  Alert and cooperative. Normal mood and affect  ASSESSMENT AND  PLAN: -Belching/GERD:  Will schedule for EGD for further evaluation.  Unsure of the cause of his symptoms.  ? Aerophagia.  Increase pantoprazole to 40 mg BID for now. -Elevated LFT's:  Now normal, but transaminases were elevated 3 months ago.  Will check abdominal ultrasound to start at this point. -Screening colonoscopy:  Will schedule with Dr. Hilarie Fredrickson.    *The risks, benefits, and alternatives were discussed with the patient and he consents to proceed with EGD and colonoscopy.    Addendum: Reviewed and agree with initial management. See result note regarding thoughts about biliary ductal dilatation. EUS to be considered with Dr. Ardis Hughs to evaluate the bile duct given history of elevated liver enzymes and inability perform MRCP in the setting of an ICD Jerene Bears, MD

## 2014-02-03 ENCOUNTER — Ambulatory Visit (HOSPITAL_COMMUNITY)
Admission: RE | Admit: 2014-02-03 | Discharge: 2014-02-03 | Disposition: A | Discharge status: Home / Self Care | Payer: 59 | Source: Ambulatory Visit | Attending: Gastroenterology | Admitting: Gastroenterology

## 2014-02-03 DIAGNOSIS — R7989 Other specified abnormal findings of blood chemistry: Secondary | ICD-10-CM | POA: Diagnosis not present

## 2014-02-03 DIAGNOSIS — R945 Abnormal results of liver function studies: Secondary | ICD-10-CM

## 2014-02-04 ENCOUNTER — Encounter: Payer: Self-pay | Admitting: Internal Medicine

## 2014-02-06 ENCOUNTER — Ambulatory Visit (INDEPENDENT_AMBULATORY_CARE_PROVIDER_SITE_OTHER): Payer: 59 | Admitting: Internal Medicine

## 2014-02-06 ENCOUNTER — Other Ambulatory Visit: Payer: Self-pay | Admitting: Internal Medicine

## 2014-02-06 ENCOUNTER — Encounter: Payer: Self-pay | Admitting: Internal Medicine

## 2014-02-06 VITALS — BP 118/72 | HR 51 | Ht 71.0 in | Wt 189.4 lb

## 2014-02-06 DIAGNOSIS — Z4502 Encounter for adjustment and management of automatic implantable cardiac defibrillator: Secondary | ICD-10-CM

## 2014-02-06 DIAGNOSIS — I4901 Ventricular fibrillation: Secondary | ICD-10-CM

## 2014-02-06 LAB — MDC_IDC_ENUM_SESS_TYPE_INCLINIC
Date Time Interrogation Session: 20151030040000
HighPow Impedance: 48 Ohm
HighPow Impedance: 71 Ohm
Implantable Pulse Generator Serial Number: 115351
Lead Channel Impedance Value: 356 Ohm
Lead Channel Pacing Threshold Amplitude: 1.8 V
Lead Channel Pacing Threshold Pulse Width: 1 ms
Lead Channel Setting Pacing Pulse Width: 1 ms
Lead Channel Setting Sensing Sensitivity: 0.4 mV
MDC IDC MSMT BATTERY REMAINING LONGEVITY: 132 mo
MDC IDC MSMT LEADCHNL RV SENSING INTR AMPL: 9.8 mV
MDC IDC SET LEADCHNL RV PACING AMPLITUDE: 3 V
MDC IDC STAT BRADY RV PERCENT PACED: 1 %
Zone Setting Detection Interval: 286 ms

## 2014-02-06 NOTE — Patient Instructions (Addendum)
Your physician recommends that you continue on your current medications as directed. Please refer to the Current Medication list given to you today.   Your physician wants you to follow-up in: 1 year with Dr. Caryl Comes. Your physician recommends that you schedule a follow-up appointment in: 6 months with device clinic  You will receive a reminder letter in the mail two months in advance. If you don't receive a letter, please call our office to schedule the follow-up appointment.

## 2014-02-06 NOTE — Progress Notes (Signed)
      Patient Care Team: Midge Minium, MD as PCP - General (Family Medicine)   HPI  Jeremy Walker is a 63 y.o. male Followup for a reported cardiac arrest that happened recurrently many years ago. On both occasions he were associated with profound hypokalemia the cause of which was never elucidated. He is status post ICD implantation and prior generator replacement.he underwent generator replacement again June 2012  He denies chest pain and lower extremity claudication. He has chronic shortness of breath. He's not had any edema   He has stopped smoking [facility]!!!!!!!        Past Medical History      Past Medical History  Diagnosis Date  . Sudden cardiac death     aborted  . Automatic implantable cardiac defibrillator     Abdominal implant was 0064 Guidant lead 6/5 mm  . Hypokalemia     Recurrent associated with cardiac arrest  . Tobacco abuse   . Anxiety   . Asthma   . Depression   . HLD (hyperlipidemia)     Past Surgical History  Procedure Laterality Date  . Cardiac defibrillator placement  1991    guidant  . Hand surgery Left     Current Outpatient Prescriptions  Medication Sig Dispense Refill  . Alpha-D-Galactosidase (BEANO) TABS Take by mouth once.      Marland Kitchen aluminum hydroxide-magnesium carbonate (GAVISCON) 95-358 MG/15ML SUSP Take 30 mLs by mouth 4 (four) times daily - after meals and at bedtime.  355 mL  3  . atenolol (TENORMIN) 50 MG tablet TAKE ONE TABLET BY MOUTH ONCE DAILY  90 tablet  1  . MOVIPREP 100 G SOLR Take 1 kit (200 g total) by mouth once.  1 kit  0  . pantoprazole (PROTONIX) 40 MG tablet Take 1 tablet (40 mg total) by mouth 2 (two) times daily.  60 tablet  6  . simvastatin (ZOCOR) 20 MG tablet Take 1 tablet (20 mg total) by mouth at bedtime.  90 tablet  1   No current facility-administered medications for this visit.    No Known Allergies  Review of Systems negative except from HPI and PMH  Physical Exam BP 118/72  Pulse 51  Ht 5'  11" (1.803 m)  Wt 189 lb 6.4 oz (85.911 kg)  BMI 26.43 kg/m2 Well developed and well nourished in no acute distress HENT normal E scleral and icterus clear Neck Supple JVP flat; carotids brisk and full Clear to ausculation  Regular rate and rhythm, no murmurs gallops or rub Soft with active bowel sounds No clubbing cyanosis none Edema Alert and oriented, grossly normal motor and sensory function Skin Warm and Dry  ECG NSR 16/09/42  Assessment and  Plan  Aborted ardiac arrest  No intercurrent Ventricular tachycardia  Implantable defibrillator  The patient's device was interrogated.  The information was reviewed. No changes were made in the programming.

## 2014-02-10 ENCOUNTER — Telehealth: Payer: Self-pay

## 2014-02-10 ENCOUNTER — Other Ambulatory Visit: Payer: Self-pay

## 2014-02-10 DIAGNOSIS — K839 Disease of biliary tract, unspecified: Secondary | ICD-10-CM

## 2014-02-10 NOTE — Telephone Encounter (Signed)
EUS scheduled, pt instructed and medications reviewed.  Patient instructions mailed to home.  Patient to call with any questions or concerns. Patient is not diabetic or on any anti coagulation therapy.

## 2014-02-12 ENCOUNTER — Encounter: Payer: Self-pay | Admitting: Internal Medicine

## 2014-02-16 ENCOUNTER — Encounter (HOSPITAL_COMMUNITY): Payer: Self-pay | Admitting: *Deleted

## 2014-02-26 ENCOUNTER — Encounter (HOSPITAL_COMMUNITY)
Admission: RE | Disposition: A | Discharge status: Home / Self Care | Payer: 59 | Source: Ambulatory Visit | Attending: Gastroenterology

## 2014-02-26 ENCOUNTER — Encounter (HOSPITAL_COMMUNITY): Payer: Self-pay | Admitting: Gastroenterology

## 2014-02-26 ENCOUNTER — Ambulatory Visit (HOSPITAL_COMMUNITY): Payer: 59 | Admitting: Anesthesiology

## 2014-02-26 ENCOUNTER — Ambulatory Visit (HOSPITAL_COMMUNITY)
Admission: RE | Admit: 2014-02-26 | Discharge: 2014-02-26 | Disposition: A | Discharge status: Home / Self Care | Payer: 59 | Source: Ambulatory Visit | Attending: Gastroenterology | Admitting: Gastroenterology

## 2014-02-26 DIAGNOSIS — Z801 Family history of malignant neoplasm of trachea, bronchus and lung: Secondary | ICD-10-CM | POA: Insufficient documentation

## 2014-02-26 DIAGNOSIS — R142 Eructation: Secondary | ICD-10-CM | POA: Insufficient documentation

## 2014-02-26 DIAGNOSIS — K839 Disease of biliary tract, unspecified: Secondary | ICD-10-CM

## 2014-02-26 DIAGNOSIS — Z95810 Presence of automatic (implantable) cardiac defibrillator: Secondary | ICD-10-CM | POA: Diagnosis not present

## 2014-02-26 DIAGNOSIS — K219 Gastro-esophageal reflux disease without esophagitis: Secondary | ICD-10-CM | POA: Diagnosis not present

## 2014-02-26 DIAGNOSIS — Z87891 Personal history of nicotine dependence: Secondary | ICD-10-CM | POA: Diagnosis not present

## 2014-02-26 DIAGNOSIS — Z8269 Family history of other diseases of the musculoskeletal system and connective tissue: Secondary | ICD-10-CM | POA: Insufficient documentation

## 2014-02-26 DIAGNOSIS — E876 Hypokalemia: Secondary | ICD-10-CM | POA: Diagnosis present

## 2014-02-26 DIAGNOSIS — Z833 Family history of diabetes mellitus: Secondary | ICD-10-CM | POA: Insufficient documentation

## 2014-02-26 DIAGNOSIS — Z8041 Family history of malignant neoplasm of ovary: Secondary | ICD-10-CM | POA: Diagnosis not present

## 2014-02-26 DIAGNOSIS — E785 Hyperlipidemia, unspecified: Secondary | ICD-10-CM | POA: Diagnosis not present

## 2014-02-26 DIAGNOSIS — K838 Other specified diseases of biliary tract: Secondary | ICD-10-CM | POA: Insufficient documentation

## 2014-02-26 DIAGNOSIS — K5710 Diverticulosis of small intestine without perforation or abscess without bleeding: Secondary | ICD-10-CM | POA: Diagnosis not present

## 2014-02-26 DIAGNOSIS — J45909 Unspecified asthma, uncomplicated: Secondary | ICD-10-CM | POA: Insufficient documentation

## 2014-02-26 DIAGNOSIS — Z8249 Family history of ischemic heart disease and other diseases of the circulatory system: Secondary | ICD-10-CM | POA: Diagnosis not present

## 2014-02-26 DIAGNOSIS — F418 Other specified anxiety disorders: Secondary | ICD-10-CM | POA: Diagnosis not present

## 2014-02-26 HISTORY — PX: EUS: SHX5427

## 2014-02-26 SURGERY — ESOPHAGEAL ENDOSCOPIC ULTRASOUND (EUS) RADIAL
Anesthesia: Monitor Anesthesia Care

## 2014-02-26 MED ORDER — PROMETHAZINE HCL 25 MG/ML IJ SOLN: 6.2500 mg | INTRAMUSCULAR | Status: DC | PRN

## 2014-02-26 MED ORDER — PROPOFOL 10 MG/ML IV BOLUS
INTRAVENOUS | Status: DC | PRN
  Administered 2014-02-26 (×2): 20 mg via INTRAVENOUS
  Administered 2014-02-26: 50 mg via INTRAVENOUS
  Administered 2014-02-26: 20 mg via INTRAVENOUS

## 2014-02-26 MED ORDER — PROPOFOL INFUSION 10 MG/ML OPTIME
INTRAVENOUS | Status: DC | PRN
  Administered 2014-02-26: 100 ug/kg/min via INTRAVENOUS

## 2014-02-26 MED ORDER — SODIUM CHLORIDE 0.9 % IV SOLN: INTRAVENOUS | Status: DC

## 2014-02-26 MED ORDER — LACTATED RINGERS IV SOLN
INTRAVENOUS | Status: DC
  Administered 2014-02-26: 09:00:00 via INTRAVENOUS

## 2014-02-26 MED ORDER — PROPOFOL 10 MG/ML IV BOLUS
INTRAVENOUS | Status: AC
  Filled 2014-02-26: qty 20

## 2014-02-26 NOTE — Transfer of Care (Signed)
Immediate Anesthesia Transfer of Care Note  Patient: Jeremy Walker  Procedure(s) Performed: Procedure(s) (LRB): ESOPHAGEAL ENDOSCOPIC ULTRASOUND (EUS) RADIAL (N/A)  Patient Location: PACU  Anesthesia Type: MAC  Level of Consciousness: sedated, patient cooperative and responds to stimulation  Airway & Oxygen Therapy: Patient Spontanous Breathing and Patient connected to face mask oxgen  Post-op Assessment: Report given to PACU RN and Post -op Vital signs reviewed and stable  Post vital signs: Reviewed and stable  Complications: No apparent anesthesia complications

## 2014-02-26 NOTE — Op Note (Signed)
[facility] [place], [address]   ENDOSCOPIC ULTRASOUND PROCEDURE REPORT  PATIENT: Jeremy, Walker  MR#: [id number] BIRTHDATE: [birth year]  GENDER: male ENDOSCOPIST: Milus Banister, MD REFERRED BY:  Zenovia Jarred, MD PROCEDURE DATE:  02/26/2014 PROCEDURE:   Upper EUS ASA CLASS:      Class II INDICATIONS:   1.  transiently elevated liver tests, dilated CBD (8-8.25mm), no stones in gallbladder. MEDICATIONS: Monitored anesthesia care  DESCRIPTION OF PROCEDURE:   After the risks benefits and alternatives of the procedure were  explained, informed consent was obtained. The patient was then placed in the left, lateral, decubitus postion and IV sedation was administered. Throughout the procedure, the patients blood pressure, pulse and oxygen saturations were monitored continuously.  Under direct visualization, the PENTAX EUS SCOPE  endoscope was introduced through the mouth  and advanced to the second portion of the duodenum .  Water was used as necessary to provide an acoustic interface.  Upon completion of the imaging, water was removed and the patient was sent to the recovery room in satisfactory condition.    Endoscopic findings (limited views with radial echoendoscope and then side viewing duodenoscope): 1.  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  EUS findings: 1. CBD was slightly dilated (up to 7.62mm), it contained no stones. The most distal aspect of the CBD was truncated and there was a collection of fluid, air nearby the distal bile duct that I felt was likely from a diverticulum.  After the EUS examination, I passed a duodenoscope and confirmed the presense of periampullary diverticulum (see above). 2. Pancreatic parenchyma was normal. 3. Main pancreatic duct was normal, non-dilated. 4. No peripancreatic adenopathy. 5. Limited views of  liver, spleen, portal and splenic vessels were all normal.  ENDOSCOPIC IMPRESSION: The medium to large periampullary diverticulum is causing his slightly dilated bile duct.  I do not think that this is the cause of his mild, transiently elevated AST, ALT.  This also is not repsonsible for his biggest symptom, belching.  I recommended he start taking one gas ex pill with every meal.  He is already scheudled for colonoscopy with Dr. Hilarie Fredrickson in 2-3 weeks from now.  _______________________________ eSigned:  Milus Banister, MD 02/26/2014 9:40 AM

## 2014-02-26 NOTE — Interval H&P Note (Signed)
History and Physical Interval Note:  02/26/2014 7:38 AM  Jeremy Walker  has presented today for surgery, with the diagnosis of biliary dilation  The various methods of treatment have been discussed with the patient and family. After consideration of risks, benefits and other options for treatment, the patient has consented to  Procedure(s) with comments: ESOPHAGEAL ENDOSCOPIC ULTRASOUND (EUS) RADIAL (N/A) - radial linear as a surgical intervention .  The patient's history has been reviewed, patient examined, no change in status, stable for surgery.  I have reviewed the patient's chart and labs.  Questions were answered to the patient's satisfaction.     Milus Banister

## 2014-02-26 NOTE — Anesthesia Preprocedure Evaluation (Signed)
Anesthesia Evaluation  Patient identified by MRN, date of birth, ID band Patient awake    Reviewed: Allergy & Precautions, H&P , NPO status , Patient's Chart, lab work & pertinent test results  Airway Mallampati: II  TM Distance: >3 FB Neck ROM: Full    Dental no notable dental hx.    Pulmonary neg pulmonary ROS, former smoker,  breath sounds clear to auscultation  Pulmonary exam normal       Cardiovascular + Cardiac Defibrillator Rhythm:Regular Rate:Normal     Neuro/Psych negative neurological ROS  negative psych ROS   GI/Hepatic negative GI ROS, Neg liver ROS,   Endo/Other  negative endocrine ROS  Renal/GU negative Renal ROS  negative genitourinary   Musculoskeletal negative musculoskeletal ROS (+)   Abdominal   Peds negative pediatric ROS (+)  Hematology negative hematology ROS (+)   Anesthesia Other Findings   Reproductive/Obstetrics negative OB ROS                             Anesthesia Physical Anesthesia Plan  ASA: III  Anesthesia Plan: MAC   Post-op Pain Management:    Induction: Intravenous  Airway Management Planned: Nasal Cannula  Additional Equipment:   Intra-op Plan:   Post-operative Plan:   Informed Consent: I have reviewed the patients History and Physical, chart, labs and discussed the procedure including the risks, benefits and alternatives for the proposed anesthesia with the patient or authorized representative who has indicated his/her understanding and acceptance.   Dental advisory given  Plan Discussed with: CRNA and Surgeon  Anesthesia Plan Comments:         Anesthesia Quick Evaluation

## 2014-02-26 NOTE — Anesthesia Postprocedure Evaluation (Signed)
  Anesthesia Post-op Note  Patient: Jeremy Walker  Procedure(s) Performed: Procedure(s) (LRB): ESOPHAGEAL ENDOSCOPIC ULTRASOUND (EUS) RADIAL (N/A)  Patient Location: PACU  Anesthesia Type: MAC  Level of Consciousness: awake and alert   Airway and Oxygen Therapy: Patient Spontanous Breathing  Post-op Pain: mild  Post-op Assessment: Post-op Vital signs reviewed, Patient's Cardiovascular Status Stable, Respiratory Function Stable, Patent Airway and No signs of Nausea or vomiting  Last Vitals:  Filed Vitals:   02/26/14 0934  BP:   Pulse:   Temp: 36.5 C  Resp:     Post-op Vital Signs: stable   Complications: No apparent anesthesia complications

## 2014-02-26 NOTE — Discharge Instructions (Signed)
Gastrointestinal Endoscopy, Care After °Refer to this sheet in the next few weeks. These instructions provide you with information on caring for yourself after your procedure. Your caregiver may also give you more specific instructions. Your treatment has been planned according to current medical practices, but problems sometimes occur. Call your caregiver if you have any problems or questions after your procedure. °HOME CARE INSTRUCTIONS °· If you were given medicine to help you relax (sedative), do not drive, operate machinery, or sign important documents for 24 hours. °· Avoid alcohol and hot or warm beverages for the first 24 hours after the procedure. °· Only take over-the-counter or prescription medicines for pain, discomfort, or fever as directed by your caregiver. You may resume taking your normal medicines unless your caregiver tells you otherwise. Ask your caregiver when you may resume taking medicines that may cause bleeding, such as aspirin, clopidogrel, or warfarin. °· You may return to your normal diet and activities on the day after your procedure, or as directed by your caregiver. Walking may help to reduce any bloated feeling in your abdomen. °· Drink enough fluids to keep your urine clear or pale yellow. °· You may gargle with salt water if you have a sore throat. °SEEK IMMEDIATE MEDICAL CARE IF: °· You have severe nausea or vomiting. °· You have severe abdominal pain, abdominal cramps that last longer than 6 hours, or abdominal swelling (distention). °· You have severe shoulder or back pain. °· You have trouble swallowing. °· You have shortness of breath, your breathing is shallow, or you are breathing faster than normal. °· You have a fever or a rapid heartbeat. °· You vomit blood or material that looks like coffee grounds. °· You have bloody, black, or tarry stools. °MAKE SURE YOU: °· Understand these instructions. °· Will watch your condition. °· Will get help right away if you are not doing  well or get worse. °Document Released: 11/09/2003 Document Revised: 08/11/2013 Document Reviewed: 06/27/2011 °[employer]® Patient Information ©2015 [employer]. This information is not intended to replace advice given to you by your health care provider. Make sure you discuss any questions you have with your health care provider. ° °

## 2014-02-26 NOTE — H&P (View-Only) (Signed)
01/30/2014 Jeremy Walker [id number] [birth year]   HISTORY OF PRESENT ILLNESS:  This is a 63 year old male who has PMH of defibrillator placement after a near sudden cardiac death experience in 06-16-89 that was associated with recurrent hypokalemia, HLD, and anxiety/depression.  He presents to our office today with complaints of belching and reflux that has been present for the past several months.  He says that has been experiencing constant belching and acid reflux coming up into his throat.  He says that the belching occurs every time that he eats or drinks anything, even water.  He says that the belching gets worse when lying down at night as well and keeps him from sleeping.  He's tried to eliminate coffee and other foods, but it has not made a difference in his symptoms.  He started daily PPI and this has helped the reflux but has not helped the belching.  Also complains of 20 pound weight gain since June; says that all of the weight gain is in his abdomen and it feels bloated and hard.  No improvement with Gaviscon and Beano.  Checked for Hpylori by PCP and was negative.  BMP was normal.  LFT's were normal as well, but were noted to be elevated 3 months ago with AST 58 and ALT 97 but ALP and total bili were were normal.    He's never undergone colonoscopy in the past.  He denies any rectal bleeding or bowel issues.  No family history of colon cancer.    Past Medical History  Diagnosis Date  . Sudden cardiac death     aborted  . Automatic implantable cardiac defibrillator     Abdominal implant was 0064 Guidant lead 6/5 mm  . Hypokalemia     Recurrent associated with cardiac arrest  . Tobacco abuse   . Anxiety   . Asthma   . Depression   . HLD (hyperlipidemia)    Past Surgical History  Procedure Laterality Date  . Cardiac defibrillator placement  1991    guidant  . Hand surgery Left     reports that he quit smoking about 21 months ago. His smoking use included Cigarettes. He has  a 63 pack-year smoking history. He has never used smokeless tobacco. He reports that he does not drink alcohol or use illicit drugs. family history includes Cancer in his father; Diabetes in his brother, maternal aunt, and mother; Heart failure in his mother; Lung cancer in his brother; Ovarian cancer in his mother; Parkinson's disease in his mother; Scoliosis in his mother. No Known Allergies    Outpatient Encounter Prescriptions as of 01/29/2014  Medication Sig  . Alpha-D-Galactosidase (BEANO) TABS Take by mouth once.  Marland Kitchen aluminum hydroxide-magnesium carbonate (GAVISCON) 95-358 MG/15ML SUSP Take 30 mLs by mouth 4 (four) times daily - after meals and at bedtime.  Marland Kitchen atenolol (TENORMIN) 50 MG tablet TAKE ONE TABLET BY MOUTH ONCE DAILY  . pantoprazole (PROTONIX) 40 MG tablet Take 1 tablet (40 mg total) by mouth 2 (two) times daily.  . simvastatin (ZOCOR) 20 MG tablet Take 1 tablet (20 mg total) by mouth at bedtime.  . [DISCONTINUED] pantoprazole (PROTONIX) 40 MG tablet Take 1 tablet (40 mg total) by mouth daily.  Marland Kitchen MOVIPREP 100 G SOLR Take 1 kit (200 g total) by mouth once.     REVIEW OF SYSTEMS  : All other systems reviewed and negative except where noted in the History of Present Illness.   PHYSICAL EXAM: BP 132/80  Pulse 60  Ht '5\' 11"'  (1.803 m)  Wt 186 lb 6 oz (84.539 kg)  BMI 26.01 kg/m2 General: Well developed white male in no acute distress Head: Normocephalic and atraumatic Eyes:  Sclerae anicteric, conjunctiva pink. Ears: Normal auditory acuity Lungs: Clear throughout to auscultation Heart: Regular rate and rhythm Abdomen: Soft, non-distended.  Normal bowel sounds.  Non-tender. Rectal:  Will be done at the time of colonoscopy. Musculoskeletal: Symmetrical with no gross deformities  Skin: No lesions on visible extremities Extremities: No edema  Neurological: Alert oriented x 4, grossly non-focal Psychological:  Alert and cooperative. Normal mood and affect  ASSESSMENT AND  PLAN: -Belching/GERD:  Will schedule for EGD for further evaluation.  Unsure of the cause of his symptoms.  ? Aerophagia.  Increase pantoprazole to 40 mg BID for now. -Elevated LFT's:  Now normal, but transaminases were elevated 3 months ago.  Will check abdominal ultrasound to start at this point. -Screening colonoscopy:  Will schedule with Dr. Hilarie Fredrickson.    *The risks, benefits, and alternatives were discussed with the patient and he consents to proceed with EGD and colonoscopy.    Addendum: Reviewed and agree with initial management. See result note regarding thoughts about biliary ductal dilatation. EUS to be considered with Dr. Ardis Hughs to evaluate the bile duct given history of elevated liver enzymes and inability perform MRCP in the setting of an ICD Jeremy Bears, MD

## 2014-02-27 ENCOUNTER — Encounter (HOSPITAL_COMMUNITY): Payer: Self-pay | Admitting: Gastroenterology

## 2014-03-11 ENCOUNTER — Encounter: Payer: Self-pay | Admitting: Internal Medicine

## 2014-03-11 ENCOUNTER — Ambulatory Visit (AMBULATORY_SURGERY_CENTER): Payer: 59 | Admitting: Internal Medicine

## 2014-03-11 VITALS — BP 115/73 | HR 50 | Temp 97.6°F | Resp 19 | Ht 71.0 in | Wt 186.0 lb

## 2014-03-11 DIAGNOSIS — K5710 Diverticulosis of small intestine without perforation or abscess without bleeding: Secondary | ICD-10-CM

## 2014-03-11 DIAGNOSIS — K209 Esophagitis, unspecified without bleeding: Secondary | ICD-10-CM

## 2014-03-11 DIAGNOSIS — Z1211 Encounter for screening for malignant neoplasm of colon: Secondary | ICD-10-CM

## 2014-03-11 DIAGNOSIS — R140 Abdominal distension (gaseous): Secondary | ICD-10-CM

## 2014-03-11 DIAGNOSIS — R142 Eructation: Secondary | ICD-10-CM

## 2014-03-11 DIAGNOSIS — K219 Gastro-esophageal reflux disease without esophagitis: Secondary | ICD-10-CM

## 2014-03-11 DIAGNOSIS — D125 Benign neoplasm of sigmoid colon: Secondary | ICD-10-CM

## 2014-03-11 MED ORDER — SODIUM CHLORIDE 0.9 % IV SOLN: 500.0000 mL | INTRAVENOUS | Status: DC

## 2014-03-11 MED ORDER — METRONIDAZOLE 250 MG PO TABS: 250.0000 mg | ORAL_TABLET | Freq: Three times a day (TID) | ORAL | Status: DC

## 2014-03-11 NOTE — Progress Notes (Signed)
A/ox3 pleased with MAC, report to Celia RN 

## 2014-03-11 NOTE — Op Note (Signed)
[place]  [address]. [place], [id number]   ENDOSCOPY PROCEDURE REPORT  PATIENT: [name], [name]  MR#: [id number] BIRTHDATE: [birth year] , 36  yrs. old GENDER: male ENDOSCOPIST: Jerene Bears, MD REFERRED BY:  Annye Asa, M.D. PROCEDURE DATE:  03/11/2014 PROCEDURE:  EGD w/ biopsy ASA CLASS:     Class III INDICATIONS:  belching and abdominal bloating. MEDICATIONS: Monitored anesthesia care and Propofol 150 mg IV TOPICAL ANESTHETIC: none  DESCRIPTION OF PROCEDURE: After the risks benefits and alternatives of the procedure were thoroughly explained, informed consent was obtained.  The LB [id number] endoscope was introduced through the mouth and advanced to the second portion of the duodenum , Without limitations.  The instrument was slowly withdrawn as the mucosa was fully examined.   ESOPHAGUS: There was probable short segment Barrett's esophagus (one tongue  of salmon-colored mucosa extending above the top of the gastric folds at hiatal hernia) found at the gastroesophageal junction.  There was no nodular mucosa noted in the possible Barrett's segment.  Multiple biopsies were performed using cold forceps.   There was mild distal esophagitis noted.  STOMACH: A 4 cm hiatal hernia was noted.   The stomach otherwise appeared normal.  DUODENUM: A medium sized diverticulum was found in the 2nd part of the duodenum.   The duodenal mucosa showed no abnormalities in the bulb and 2nd part of the duodenum.  Retroflexed views revealed a hiatal hernia.     The scope was then withdrawn from the patient and the procedure completed.  COMPLICATIONS: There were no immediate complications.  ENDOSCOPIC IMPRESSION: 1.   There was possible short segment Barrett's esophagus found at the gastroesophageal junction; multiple biopsies were performed 2.   There was mild reflux esophagitis noted 3.   4 cm hiatal hernia 4.   The stomach otherwise appeared normal 5.    Diverticulum was found in the 2nd part of the duodenum 6.   The duodenal mucosa showed no abnormalities in the bulb and 2nd part of the duodenum  RECOMMENDATIONS: 1.  Await biopsy results 2.  Empiric treatment for bacterial overgrowth with metronidazole 250 mg 3 times daily -10 days 3.  Continue taking your PPI (antiacid medicine) once daily.  It is best to be taken 20-30 minutes prior to breakfast meal. eSigned:  Jerene Bears, MD 03/11/2014 5:01 PM    CC: The patient, PCP  PATIENT NAME:  Jeremy Walker, Jeremy Walker MR#: [id number]

## 2014-03-11 NOTE — Progress Notes (Addendum)
Pt. Was not aware that he was scheduled for EGD.  Had endoscopic ultrasound of esophagus at [facility] that he thought was to evaluate belching. Spoke with Dr. Hilarie Fredrickson.  Understanding he advised that pt.should be told that EGD would give a more comprehensive look at stomach and perhaps explain why belching has become a problem.   Pt verbalized understanding.

## 2014-03-11 NOTE — Progress Notes (Signed)
Called to room to assist during endoscopic procedure.  Patient ID and intended procedure confirmed with present staff. Received instructions for my participation in the procedure from the performing physician.  

## 2014-03-11 NOTE — Patient Instructions (Signed)
Discharge instructions given. Handouts on polyps ,diverticulosis,hiatal hernia and esophagitis Biopsies taken. Prescription sent in to pharmacy. Resume previous medications. YOU HAD AN ENDOSCOPIC PROCEDURE TODAY AT [facility]: Refer to the procedure report that was given to you for any specific questions about what was found during the examination.  If the procedure report does not answer your questions, please call your gastroenterologist to clarify.  If you requested that your care partner not be given the details of your procedure findings, then the procedure report has been included in a sealed envelope for you to review at your convenience later.  YOU SHOULD EXPECT: Some feelings of bloating in the abdomen. Passage of more gas than usual.  Walking can help get rid of the air that was put into your GI tract during the procedure and reduce the bloating. If you had a lower endoscopy (such as a colonoscopy or flexible sigmoidoscopy) you may notice spotting of blood in your stool or on the toilet paper. If you underwent a bowel prep for your procedure, then you may not have a normal bowel movement for a few days.  DIET: Your first meal following the procedure should be a light meal and then it is ok to progress to your normal diet.  A half-sandwich or bowl of soup is an example of a good first meal.  Heavy or fried foods are harder to digest and may make you feel nauseous or bloated.  Likewise meals heavy in dairy and vegetables can cause extra gas to form and this can also increase the bloating.  Drink plenty of fluids but you should avoid alcoholic beverages for 24 hours.  ACTIVITY: Your care partner should take you home directly after the procedure.  You should plan to take it easy, moving slowly for the rest of the day.  You can resume normal activity the day after the procedure however you should NOT DRIVE or use heavy machinery for 24 hours (because of the sedation medicines used  during the test).    SYMPTOMS TO REPORT IMMEDIATELY: A gastroenterologist can be reached at any hour.  During normal business hours, 8:30 AM to 5:00 PM Monday through Friday, call [phone].  After hours and on weekends, please call the GI answering service at [phone] who will take a message and have the physician on call contact you.   Following lower endoscopy (colonoscopy or flexible sigmoidoscopy):  Excessive amounts of blood in the stool  Significant tenderness or worsening of abdominal pains  Swelling of the abdomen that is new, acute  Fever of 100F or higher  Following upper endoscopy (EGD)  Vomiting of blood or coffee ground material  New chest pain or pain under the shoulder blades  Painful or persistently difficult swallowing  New shortness of breath  Fever of 100F or higher  Black, tarry-looking stools  FOLLOW UP: If any biopsies were taken you will be contacted by phone or by letter within the next 1-3 weeks.  Call your gastroenterologist if you have not heard about the biopsies in 3 weeks.  Our staff will call the home number listed on your records the next business day following your procedure to check on you and address any questions or concerns that you may have at that time regarding the information given to you following your procedure. This is a courtesy call and so if there is no answer at the home number and we have not heard from you through the emergency physician on call,  we will assume that you have returned to your regular daily activities without incident.  SIGNATURES/CONFIDENTIALITY: You and/or your care partner have signed paperwork which will be entered into your electronic medical record.  These signatures attest to the fact that that the information above on your After Visit Summary has been reviewed and is understood.  Full responsibility of the confidentiality of this discharge information lies with you and/or your care-partner.

## 2014-03-11 NOTE — Op Note (Signed)
[place]  [address]. [place], [id number]   COLONOSCOPY PROCEDURE REPORT  PATIENT: Jeremy Walker, Jeremy Walker  MR#: [id number] BIRTHDATE: Apr 09, 1951 , 50  yrs. old GENDER: male ENDOSCOPIST: Jerene Bears, MD REFERRED KW:[id number] Birdie Riddle, M.D. PROCEDURE DATE:  03/11/2014 PROCEDURE:   Colonoscopy with snare polypectomy First Screening Colonoscopy - Avg.  risk and is 50 yrs.  old or older Yes.  Prior Negative Screening - Now for repeat screening. N/A  History of Adenoma - Now for follow-up colonoscopy & has been > or = to 3 yrs.  N/A  Polyps Removed Today? Yes. ASA CLASS:   Class III INDICATIONS:average risk for colon cancer and first colonoscopy. MEDICATIONS: Monitored anesthesia care and Propofol 50 mg IV  DESCRIPTION OF PROCEDURE:   After the risks benefits and alternatives of the procedure were thoroughly explained, informed consent was obtained.  The digital rectal exam revealed no rectal mass.   The LB [id number]  endoscope was introduced through the anus and advanced to the cecum, which was identified by both the appendix and ileocecal valve. No adverse events experienced. The quality of the prep was good, using MoviPrep  The instrument was then slowly withdrawn as the colon was fully examined.   COLON FINDINGS: A sessile polyp measuring 5 mm in size was found in the sigmoid colon.  A polypectomy was performed with a cold snare. The resection was complete, the polyp tissue was completely retrieved and sent to histology.   There was mild diverticulosis noted in the sigmoid colon.  Retroflexed views revealed no abnormalities. The time to cecum=3 minutes 12 seconds.  Withdrawal time=11 minutes 02 seconds.  The scope was withdrawn and the procedure completed. COMPLICATIONS: There were no immediate complications.  ENDOSCOPIC IMPRESSION: 1.   Sessile polyp was found in the sigmoid colon; polypectomy was performed with a cold snare 2.   Mild diverticulosis was  noted in the sigmoid colon  RECOMMENDATIONS: 1.  Await pathology results 2.  If the polyp removed today is proven to be an adenomatous (pre-cancerous) polys, you will need a repeat colonoscopy in 5 years.  Otherwise you should continue to follow colorectal cancer screening guidelines for "routine risk" patients with colonoscopy in 10 years.  You will receive a letter within 1-2 weeks with the results of your biopsy as well as final recommendations.  Please call my office if you have not received a letter after 3 weeks.  eSigned:  Jerene Bears, MD 03/11/2014 5:04 PM   cc:  the patient, PCP

## 2014-03-12 ENCOUNTER — Telehealth: Payer: Self-pay | Admitting: *Deleted

## 2014-03-12 NOTE — Telephone Encounter (Signed)
  Follow up Call-  Call back number 03/11/2014  Post procedure Call Back phone  # [phone]  Permission to leave phone message Yes     Patient questions:  Do you have a fever, pain , or abdominal swelling? No. Pain Score  0 *  Have you tolerated food without any problems? Yes.    Have you been able to return to your normal activities? Yes.    Do you have any questions about your discharge instructions: Diet   No. Medications  No. Follow up visit  No.  Do you have questions or concerns about your Care? No.  Actions: * If pain score is 4 or above: No action needed, pain <4.  Pt states he was "burping last night, not today", he states he is taking his antibiotic. No questions or problems at this time. RM

## 2014-03-18 ENCOUNTER — Encounter: Payer: Self-pay | Admitting: Internal Medicine

## 2014-03-23 ENCOUNTER — Telehealth: Payer: Self-pay | Admitting: Internal Medicine

## 2014-03-23 NOTE — Telephone Encounter (Signed)
Pt had questions regarding his results letter. Results discussed with pt and his questions were answered.

## 2014-04-13 ENCOUNTER — Ambulatory Visit (INDEPENDENT_AMBULATORY_CARE_PROVIDER_SITE_OTHER): Payer: 59 | Admitting: Family Medicine

## 2014-04-13 ENCOUNTER — Encounter: Payer: Self-pay | Admitting: Family Medicine

## 2014-04-13 VITALS — BP 110/80 | HR 67 | Temp 98.2°F | Resp 16 | Ht 72.0 in | Wt 192.4 lb

## 2014-04-13 DIAGNOSIS — Z23 Encounter for immunization: Secondary | ICD-10-CM

## 2014-04-13 DIAGNOSIS — Z0000 Encounter for general adult medical examination without abnormal findings: Secondary | ICD-10-CM | POA: Insufficient documentation

## 2014-04-13 DIAGNOSIS — Z95810 Presence of automatic (implantable) cardiac defibrillator: Secondary | ICD-10-CM

## 2014-04-13 LAB — CBC WITH DIFFERENTIAL/PLATELET
Basophils Absolute: 0 10*3/uL (ref 0.0–0.1)
Basophils Relative: 0.5 % (ref 0.0–3.0)
EOS PCT: 1 % (ref 0.0–5.0)
Eosinophils Absolute: 0.1 10*3/uL (ref 0.0–0.7)
HEMATOCRIT: 47.3 % (ref 39.0–52.0)
Hemoglobin: 15.6 g/dL (ref 13.0–17.0)
LYMPHS ABS: 2.1 10*3/uL (ref 0.7–4.0)
Lymphocytes Relative: 37.1 % (ref 12.0–46.0)
MCHC: 32.9 g/dL (ref 30.0–36.0)
MCV: 96.8 fl (ref 78.0–100.0)
MONOS PCT: 7.5 % (ref 3.0–12.0)
Monocytes Absolute: 0.4 10*3/uL (ref 0.1–1.0)
Neutro Abs: 3.1 10*3/uL (ref 1.4–7.7)
Neutrophils Relative %: 53.9 % (ref 43.0–77.0)
Platelets: 122 10*3/uL — ABNORMAL LOW (ref 150.0–400.0)
RBC: 4.89 Mil/uL (ref 4.22–5.81)
RDW: 13.6 % (ref 11.5–15.5)
WBC: 5.7 10*3/uL (ref 4.0–10.5)

## 2014-04-13 LAB — BASIC METABOLIC PANEL
BUN: 17 mg/dL (ref 6–23)
CALCIUM: 9.5 mg/dL (ref 8.4–10.5)
CO2: 25 mEq/L (ref 19–32)
Chloride: 106 mEq/L (ref 96–112)
Creatinine, Ser: 1 mg/dL (ref 0.4–1.5)
GFR: 78.45 mL/min (ref >60.00)
Glucose, Bld: 108 mg/dL — ABNORMAL HIGH (ref 70–99)
Potassium: 4.4 mEq/L (ref 3.5–5.1)
SODIUM: 144 meq/L (ref 135–145)

## 2014-04-13 LAB — LIPID PANEL
Cholesterol: 159 mg/dL (ref 0–200)
HDL: 42.7 mg/dL (ref >39.00)
LDL CALC: 84 mg/dL (ref 0–99)
NONHDL: 116.3
Total CHOL/HDL Ratio: 4
Triglycerides: 164 mg/dL — ABNORMAL HIGH (ref 0.0–149.0)
VLDL: 32.8 mg/dL (ref 0.0–40.0)

## 2014-04-13 LAB — HEPATIC FUNCTION PANEL
ALBUMIN: 4.5 g/dL (ref 3.5–5.2)
ALK PHOS: 87 U/L (ref 39–117)
ALT: 48 U/L (ref 0–53)
AST: 33 U/L (ref 0–37)
BILIRUBIN DIRECT: 0.1 mg/dL (ref 0.0–0.3)
BILIRUBIN TOTAL: 0.5 mg/dL (ref 0.2–1.2)
Total Protein: 7.1 g/dL (ref 6.0–8.3)

## 2014-04-13 LAB — PSA: PSA: 0.93 ng/mL (ref 0.10–4.00)

## 2014-04-13 LAB — TSH: TSH: 8.17 u[IU]/mL — AB (ref 0.35–4.50)

## 2014-04-13 NOTE — Progress Notes (Signed)
Pre visit review using our clinic review tool, if applicable. No additional management support is needed unless otherwise documented below in the visit note. 

## 2014-04-13 NOTE — Patient Instructions (Signed)
Follow up in 6 months to recheck cholesterol We'll notify you of your lab results and make any changes if needed Try and make healthy food choices and get regular exercise Call with any questions or concerns Happy New Year!!!

## 2014-04-13 NOTE — Progress Notes (Signed)
   Subjective:    Patient ID: Jeremy Walker, male    DOB: 11/16/1950, 65 y.o.   MRN: [id number]  HPI CPE- UTD on colonoscopy.  Due for Tdap and Zoster vaccines.     Review of Systems Patient reports no vision/hearing changes, anorexia, fever ,adenopathy, persistant/recurrent hoarseness, swallowing issues, chest pain, palpitations, edema, persistant/recurrent cough, hemoptysis, dyspnea (rest,exertional, paroxysmal nocturnal), gastrointestinal  bleeding (melena, rectal bleeding), abdominal pain, GU symptoms (dysuria, hematuria, voiding/incontinence issues) syncope, focal weakness, memory loss, numbness & tingling, skin/hair/nail changes, depression, anxiety, abnormal bruising/bleeding, musculoskeletal symptoms/signs.   + GERD, gas/bloating    Objective:   Physical Exam General Appearance:    Alert, cooperative, no distress, appears stated age  Head:    Normocephalic, without obvious abnormality, atraumatic  Eyes:    PERRL, conjunctiva/corneas clear, EOM's intact, fundi    benign, both eyes       Ears:    Normal TM's and external ear canals, both ears  Nose:   Nares normal, septum midline, mucosa normal, no drainage   or sinus tenderness  Throat:   Lips, mucosa, and tongue normal; teeth and gums normal  Neck:   Supple, symmetrical, trachea midline, no adenopathy;       thyroid:  No enlargement/tenderness/nodules  Back:     Symmetric, no curvature, ROM normal, no CVA tenderness  Lungs:     Clear to auscultation bilaterally, respirations unlabored  Chest wall:    No tenderness or deformity  Heart:    Regular rate and rhythm, S1 and S2 normal, no murmur, rub   or gallop  Abdomen:     Soft, non-tender, bowel sounds active all four quadrants,    no masses, no organomegaly.  Defibrillator in LLQ  Genitalia:    Deferred at pt request  Rectal:    Extremities:   Extremities normal, atraumatic, no cyanosis or edema  Pulses:   2+ and symmetric all extremities  Skin:   Skin color, texture, turgor  normal, no rashes or lesions  Lymph nodes:   Cervical, supraclavicular, and axillary nodes normal  Neurologic:   CNII-XII intact. Normal strength, sensation and reflexes      throughout          Assessment & Plan:

## 2014-04-14 ENCOUNTER — Other Ambulatory Visit: Payer: Self-pay | Admitting: General Practice

## 2014-04-14 DIAGNOSIS — R7989 Other specified abnormal findings of blood chemistry: Secondary | ICD-10-CM

## 2014-04-14 MED ORDER — LEVOTHYROXINE SODIUM 75 MCG PO TABS
75.0000 ug | ORAL_TABLET | Freq: Every day | ORAL | Status: DC
Start: 2014-04-14 — End: 2014-10-13

## 2014-04-19 NOTE — Assessment & Plan Note (Signed)
Pt's PE unchanged from previous.  UTD on colonoscopy.  Check labs.  Anticipatory guidance provided.

## 2014-05-15 ENCOUNTER — Telehealth: Payer: Self-pay | Admitting: Family Medicine

## 2014-05-15 MED ORDER — SIMVASTATIN 20 MG PO TABS: 20.0000 mg | ORAL_TABLET | Freq: Every day | ORAL | Status: DC

## 2014-05-15 NOTE — Telephone Encounter (Signed)
Med filled.  

## 2014-05-15 NOTE — Telephone Encounter (Signed)
Caller name: Bink,Diane Relation to pt: self  Call back number: [phone] Pharmacy: [id number] - [place], [place] [address] [phone] (Phone) [phone] (Fax)     Reason for call:  Requesting a refill simvastatin (ZOCOR) 20 MG table

## 2014-05-18 ENCOUNTER — Other Ambulatory Visit (INDEPENDENT_AMBULATORY_CARE_PROVIDER_SITE_OTHER): Payer: 59

## 2014-05-18 DIAGNOSIS — R7989 Other specified abnormal findings of blood chemistry: Secondary | ICD-10-CM

## 2014-05-18 LAB — TSH: TSH: 0.75 u[IU]/mL (ref 0.35–4.50)

## 2014-05-25 ENCOUNTER — Other Ambulatory Visit: Payer: Self-pay | Admitting: Internal Medicine

## 2014-08-10 ENCOUNTER — Ambulatory Visit (INDEPENDENT_AMBULATORY_CARE_PROVIDER_SITE_OTHER): Payer: 59 | Admitting: *Deleted

## 2014-08-10 DIAGNOSIS — I4901 Ventricular fibrillation: Secondary | ICD-10-CM | POA: Diagnosis not present

## 2014-08-10 DIAGNOSIS — Z95810 Presence of automatic (implantable) cardiac defibrillator: Secondary | ICD-10-CM | POA: Diagnosis not present

## 2014-08-10 NOTE — Progress Notes (Signed)
ICD check in clinic. Normal device function. Threshold and sensing consistent with previous device measurements. Impedance trends stable over time. No evidence of any ventricular arrhythmias. Histogram distribution appropriate for patient and level of activity. No changes made this session. Device programmed at appropriate safety margins. Device programmed to optimize intrinsic conduction. Estimated longevity 10.49yrs. ROV w/ SK in 61mo.

## 2014-08-11 LAB — CUP PACEART INCLINIC DEVICE CHECK
Date Time Interrogation Session: 20160502040000
HIGH POWER IMPEDANCE MEASURED VALUE: 64 Ohm
HighPow Impedance: 48 Ohm
Lead Channel Impedance Value: 344 Ohm
Lead Channel Sensing Intrinsic Amplitude: 8.7 mV
Lead Channel Setting Pacing Amplitude: 3 V
Lead Channel Setting Sensing Sensitivity: 0.4 mV
MDC IDC MSMT LEADCHNL RV PACING THRESHOLD AMPLITUDE: 2 V
MDC IDC MSMT LEADCHNL RV PACING THRESHOLD PULSEWIDTH: 1 ms
MDC IDC SET LEADCHNL RV PACING PULSEWIDTH: 1 ms
MDC IDC STAT BRADY RV PERCENT PACED: 1 % — AB
Pulse Gen Serial Number: 115351
Zone Setting Detection Interval: 286 ms

## 2014-09-01 ENCOUNTER — Other Ambulatory Visit: Payer: Self-pay | Admitting: *Deleted

## 2014-09-01 MED ORDER — ATENOLOL 50 MG PO TABS: 50.0000 mg | ORAL_TABLET | Freq: Every day | ORAL | Status: DC

## 2014-09-02 ENCOUNTER — Encounter: Payer: Self-pay | Admitting: Internal Medicine

## 2014-09-16 ENCOUNTER — Other Ambulatory Visit: Payer: Self-pay | Admitting: Gastroenterology

## 2014-10-13 ENCOUNTER — Encounter: Payer: Self-pay | Admitting: Family Medicine

## 2014-10-13 ENCOUNTER — Ambulatory Visit (INDEPENDENT_AMBULATORY_CARE_PROVIDER_SITE_OTHER): Payer: 59 | Admitting: Family Medicine

## 2014-10-13 VITALS — BP 110/80 | HR 57 | Temp 97.9°F | Resp 16 | Ht 72.0 in | Wt 189.2 lb

## 2014-10-13 DIAGNOSIS — E785 Hyperlipidemia, unspecified: Secondary | ICD-10-CM

## 2014-10-13 DIAGNOSIS — E782 Mixed hyperlipidemia: Secondary | ICD-10-CM | POA: Insufficient documentation

## 2014-10-13 DIAGNOSIS — R252 Cramp and spasm: Secondary | ICD-10-CM | POA: Insufficient documentation

## 2014-10-13 DIAGNOSIS — E038 Other specified hypothyroidism: Secondary | ICD-10-CM | POA: Diagnosis not present

## 2014-10-13 LAB — HEPATIC FUNCTION PANEL
ALBUMIN: 3.8 g/dL (ref 3.5–5.2)
ALT: 23 U/L (ref 0–53)
AST: 18 U/L (ref 0–37)
Alkaline Phosphatase: 93 U/L (ref 39–117)
Bilirubin, Direct: 0 mg/dL (ref 0.0–0.3)
Total Bilirubin: 0.3 mg/dL (ref 0.2–1.2)
Total Protein: 6.8 g/dL (ref 6.0–8.3)

## 2014-10-13 LAB — BASIC METABOLIC PANEL
BUN: 17 mg/dL (ref 6–23)
CO2: 28 mEq/L (ref 19–32)
CREATININE: 0.93 mg/dL (ref 0.40–1.50)
Calcium: 9.4 mg/dL (ref 8.4–10.5)
Chloride: 106 mEq/L (ref 96–112)
GFR: 87.13 mL/min (ref >60.00)
Glucose, Bld: 103 mg/dL — ABNORMAL HIGH (ref 70–99)
Potassium: 4.4 mEq/L (ref 3.5–5.1)
Sodium: 141 mEq/L (ref 135–145)

## 2014-10-13 LAB — LIPID PANEL
CHOL/HDL RATIO: 3
Cholesterol: 136 mg/dL (ref 0–200)
HDL: 43.1 mg/dL (ref >39.00)
LDL Cholesterol: 73 mg/dL (ref 0–99)
NONHDL: 92.9
Triglycerides: 101 mg/dL (ref 0.0–149.0)
VLDL: 20.2 mg/dL (ref 0.0–40.0)

## 2014-10-13 LAB — TSH: TSH: 0.05 u[IU]/mL — ABNORMAL LOW (ref 0.35–4.50)

## 2014-10-13 LAB — MAGNESIUM: Magnesium: 2 mg/dL (ref 1.5–2.5)

## 2014-10-13 MED ORDER — SIMVASTATIN 20 MG PO TABS: 20.0000 mg | ORAL_TABLET | Freq: Every day | ORAL | Status: DC

## 2014-10-13 MED ORDER — LEVOTHYROXINE SODIUM 75 MCG PO TABS: 75.0000 ug | ORAL_TABLET | Freq: Every day | ORAL | Status: DC

## 2014-10-13 NOTE — Assessment & Plan Note (Signed)
New.  Reviewed need for increased water intake- not iced tea.  Increased K+ in diet.  Check electrolytes to r/o abnormality.

## 2014-10-13 NOTE — Assessment & Plan Note (Signed)
New dx for pt.  Just started on Levothyroxine at last visit.  Asymptomatic at this time.  Repeat labs to ensure that TSH remains normal.

## 2014-10-13 NOTE — Assessment & Plan Note (Signed)
Chronic problem.  Tolerating statin w/o difficulty.  Check labs.  Adjust meds prn  

## 2014-10-13 NOTE — Progress Notes (Signed)
   Subjective:    Patient ID: Jeremy Walker, male    DOB: [birth year], 64 y.o.   MRN: [id number]  HPI Hyperlipidemia- chronic problem, on Simvastatin daily.  No CP, SOB, HAs, visual changes, edema.  No abd pain, N/V.  Hypothyroid- new at time of last visit.  Pt started levothyroxine 26mcg daily and most recent TSH was WNL.  Denies fatigue, weight change.  Leg cramps- occuring first thing in the AM or overnight.  Started recently.  Pt admits to drinking a lot of tea daily.     Review of Systems For ROS see HPI     Objective:   Physical Exam  Constitutional: He is oriented to person, place, and time. He appears well-developed and well-nourished. No distress.  HENT:  Head: Normocephalic and atraumatic.  Eyes: Conjunctivae and EOM are normal. Pupils are equal, round, and reactive to light.  Neck: Normal range of motion. Neck supple. No thyromegaly present.  Cardiovascular: Normal rate, regular rhythm, normal heart sounds and intact distal pulses.   No murmur heard. Pulmonary/Chest: Effort normal and breath sounds normal. No respiratory distress.  Abdominal: Soft. Bowel sounds are normal. He exhibits mass (pt w/ defibrillator in abd on L). He exhibits no distension.  Musculoskeletal: He exhibits no edema.  Lymphadenopathy:    He has no cervical adenopathy.  Neurological: He is alert and oriented to person, place, and time. No cranial nerve deficit.  Skin: Skin is warm and dry.  Psychiatric: He has a normal mood and affect. His behavior is normal.  Vitals reviewed.         Assessment & Plan:

## 2014-10-13 NOTE — Patient Instructions (Signed)
Schedule your complete physical in 6 months We'll notify you of your lab results and make any changes if needed Keep up the good work on healthy diet and regular exercise- you look great! Increase your water intake and increase your potassium intake in the form of leafy greens, bananas, citrus fruits to help the leg cramps Call with any questions or concerns Have a great summer!!

## 2014-10-13 NOTE — Progress Notes (Signed)
Pre visit review using our clinic review tool, if applicable. No additional management support is needed unless otherwise documented below in the visit note. 

## 2014-10-14 ENCOUNTER — Telehealth: Payer: Self-pay | Admitting: Family Medicine

## 2014-10-14 NOTE — Telephone Encounter (Signed)
Caller name: Khamron Relation to pt: Call back number: [phone] Pharmacy:  Reason for call:   Patient is requesting last lab results.

## 2014-10-15 ENCOUNTER — Other Ambulatory Visit: Payer: Self-pay | Admitting: General Practice

## 2014-10-15 DIAGNOSIS — E038 Other specified hypothyroidism: Secondary | ICD-10-CM

## 2014-10-15 MED ORDER — LEVOTHYROXINE SODIUM 50 MCG PO TABS: 50.0000 ug | ORAL_TABLET | Freq: Every day | ORAL | Status: DC

## 2014-10-15 NOTE — Telephone Encounter (Signed)
Pt wife notified of results. Labs ordered, pt will call back to schedule a lab appt in 1 month.

## 2014-10-22 ENCOUNTER — Other Ambulatory Visit: Payer: Self-pay | Admitting: Internal Medicine

## 2014-11-16 ENCOUNTER — Ambulatory Visit (INDEPENDENT_AMBULATORY_CARE_PROVIDER_SITE_OTHER): Payer: Self-pay | Admitting: Emergency Medicine

## 2014-11-16 VITALS — BP 126/74 | HR 59 | Temp 97.9°F | Resp 18 | Ht 72.0 in | Wt 191.0 lb

## 2014-11-16 DIAGNOSIS — I70219 Atherosclerosis of native arteries of extremities with intermittent claudication, unspecified extremity: Secondary | ICD-10-CM

## 2014-11-16 DIAGNOSIS — M25572 Pain in left ankle and joints of left foot: Secondary | ICD-10-CM

## 2014-11-16 MED ORDER — NAPROXEN SODIUM 550 MG PO TABS: 550.0000 mg | ORAL_TABLET | Freq: Two times a day (BID) | ORAL | Status: DC

## 2014-11-16 NOTE — Patient Instructions (Signed)

## 2014-11-16 NOTE — Progress Notes (Signed)
Subjective:  Patient ID: Jeremy Walker, male    DOB: 02-26-1951  Age: 64 y.o. MRN: [id number]  CC: Gout; Foot Swelling; and Depression   HPI [name] presents  with pain in his left foot. He has no history of injury or overuse. He said he thinks he has gout but has never had a gout attack. He has no redness or swelling or point tenderness rather has tenderness across the metatarsal heads. There is no ecchymosis or deformity in his tenderness is only mild. He does have night cramps in his legs and feet. He has claudication only with long distance walking. He is a longtime smoker and stopped smoking 2 years ago. He has no improvement with over-the-counter medication  History [name] has a past medical history of Sudden cardiac death; Automatic implantable cardiac defibrillator; Hypokalemia; Tobacco abuse; Anxiety; Asthma; Depression; HLD (hyperlipidemia); and Thyroid disease.   He has past surgical history that includes Cardiac defibrillator placement (1991); Hand surgery (Left); and EUS (N/A, 02/26/2014).   His  family history includes Cancer in his father; Diabetes in his brother, maternal aunt, and mother; Heart failure in his mother; Lung cancer in his brother; Ovarian cancer in his mother; Parkinson's disease in his mother; Scoliosis in his mother.  He   reports that he quit smoking about 2 years ago. His smoking use included Cigarettes. He has a 63 pack-year smoking history. He has never used smokeless tobacco. He reports that he does not drink alcohol or use illicit drugs.  Outpatient Prescriptions Prior to Visit  Medication Sig Dispense Refill  . atenolol (TENORMIN) 50 MG tablet Take 1 tablet (50 mg total) by mouth daily. 90 tablet 0  . levothyroxine (SYNTHROID, LEVOTHROID) 50 MCG tablet Take 1 tablet (50 mcg total) by mouth daily. 30 tablet 6  . pantoprazole (PROTONIX) 40 MG tablet TAKE 1 TABLET BY MOUTH TWICE DAILY 60 tablet 0  . simvastatin (ZOCOR) 20 MG tablet Take 1 tablet  (20 mg total) by mouth at bedtime. 90 tablet 1  . albuterol (PROVENTIL HFA;VENTOLIN HFA) 108 (90 BASE) MCG/ACT inhaler Inhale 1-2 puffs into the lungs every 6 (six) hours as needed for wheezing or shortness of breath.    Marland Kitchen aluminum hydroxide-magnesium carbonate (GAVISCON) 95-358 MG/15ML SUSP Take 30 mLs by mouth 4 (four) times daily - after meals and at bedtime. (Patient not taking: Reported on 11/16/2014) 355 mL 3   No facility-administered medications prior to visit.    History   Social History  . Marital Status: Married    Spouse Name: N/A  . Number of Children: 1  . Years of Education: N/A   Occupational History  . retired    Social History Main Topics  . Smoking status: Former Smoker -- 1.50 packs/day for 42 years    Types: Cigarettes    Quit date: 04/10/2012  . Smokeless tobacco: Never Used  . Alcohol Use: No     Comment: quit drinking about 1 year ago/ 01/29/14  . Drug Use: No  . Sexual Activity: Not on file   Other Topics Concern  . None   Social History Narrative     Review of Systems  Constitutional: Negative for fever, chills and appetite change.  HENT: Negative for congestion, ear pain, postnasal drip, sinus pressure and sore throat.   Eyes: Negative for pain and redness.  Respiratory: Negative for cough, shortness of breath and wheezing.   Cardiovascular: Negative for leg swelling.  Gastrointestinal: Negative for nausea, vomiting, abdominal pain, diarrhea, constipation and  blood in stool.  Endocrine: Negative for polyuria.  Genitourinary: Negative for dysuria, urgency, frequency and flank pain.  Musculoskeletal: Negative for gait problem.  Skin: Negative for rash.  Neurological: Negative for weakness and headaches.  Psychiatric/Behavioral: Negative for confusion and decreased concentration. The patient is not nervous/anxious.     Objective:  BP 126/74 mmHg  Pulse 59  Temp(Src) 97.9 F (36.6 C) (Oral)  Resp 18  Ht 6' (1.829 m)  Wt 191 lb (86.637 kg)   BMI 25.90 kg/m2  SpO2 95%  Physical Exam  Constitutional: He is oriented to person, place, and time. He appears well-developed and well-nourished. No distress.  HENT:  Head: Normocephalic and atraumatic.  Right Ear: External ear normal.  Left Ear: External ear normal.  Nose: Nose normal.  Eyes: Conjunctivae and EOM are normal. Pupils are equal, round, and reactive to light. No scleral icterus.  Neck: Normal range of motion. Neck supple. No tracheal deviation present.  Cardiovascular: Normal rate, regular rhythm and normal heart sounds.   Pulses:      Dorsalis pedis pulses are 0 on the right side, and 0 on the left side.       Posterior tibial pulses are 0 on the right side, and 0 on the left side.  Pulmonary/Chest: Effort normal. No respiratory distress. He has no wheezes. He has no rales.  Abdominal: He exhibits no mass. There is no tenderness. There is no rebound and no guarding.  Musculoskeletal: He exhibits no edema.       Right foot: There is decreased capillary refill.       Left foot: There is decreased capillary refill.  Lymphadenopathy:    He has no cervical adenopathy.  Neurological: He is alert and oriented to person, place, and time. Coordination normal.  Skin: Skin is warm and dry. No rash noted. Nails show clubbing.  Psychiatric: He has a normal mood and affect. His behavior is normal.   this feet were pallid with no hair beyond midcalf his feet are cool with no swelling.    Assessment & Plan:   Riyan was seen today for gout, foot swelling and depression.  Diagnoses and all orders for this visit:  Pain in joint, ankle and foot, left  Atherosclerosis of native arteries of extremity with intermittent claudication  Other orders -     naproxen sodium (ANAPROX DS) 550 MG tablet; Take 1 tablet (550 mg total) by mouth 2 (two) times daily with a meal.   I am having Mr. Mccreery start on naproxen sodium. I am also having him maintain his aluminum hydroxide-magnesium  carbonate, albuterol, atenolol, simvastatin, levothyroxine, and pantoprazole.  Meds ordered this encounter  Medications  . naproxen sodium (ANAPROX DS) 550 MG tablet    Sig: Take 1 tablet (550 mg total) by mouth 2 (two) times daily with a meal.    Dispense:  40 tablet    Refill:  0    Appropriate red flag conditions were discussed with the patient as well as actions that should be taken.  Patient expressed his understanding.  Follow-up: Return if symptoms worsen or fail to improve.  Roselee Culver, MD

## 2014-11-20 ENCOUNTER — Encounter: Payer: Self-pay | Admitting: Family Medicine

## 2014-11-20 ENCOUNTER — Ambulatory Visit (INDEPENDENT_AMBULATORY_CARE_PROVIDER_SITE_OTHER): Payer: 59 | Admitting: Family Medicine

## 2014-11-20 VITALS — BP 124/76 | HR 52 | Temp 98.0°F | Resp 16 | Ht 72.0 in | Wt 190.0 lb

## 2014-11-20 DIAGNOSIS — I739 Peripheral vascular disease, unspecified: Secondary | ICD-10-CM | POA: Insufficient documentation

## 2014-11-20 DIAGNOSIS — E038 Other specified hypothyroidism: Secondary | ICD-10-CM | POA: Diagnosis not present

## 2014-11-20 LAB — TSH: TSH: 2.772 u[IU]/mL (ref 0.350–4.500)

## 2014-11-20 MED ORDER — ASPIRIN EC 81 MG PO TBEC: 81.0000 mg | DELAYED_RELEASE_TABLET | Freq: Every day | ORAL | Status: DC

## 2014-11-20 NOTE — Progress Notes (Signed)
Pre visit review using our clinic review tool, if applicable. No additional management support is needed unless otherwise documented below in the visit note. 

## 2014-11-20 NOTE — Progress Notes (Signed)
   Subjective:    Patient ID: Jeremy Walker, male    DOB: 29-Jan-1951, 64 y.o.   MRN: [id number]  HPI PVD- pt was told at [facility] on 8/8 that he had PVD.  Pt was told by [facility] MD that he had no DP pulses.  Pt does have physical indicators- hair loss on lower legs, shiny skin.  Pt does have risk factors: hx of smoking, obesity.  Hypothyroid- ongoing issue.  Due for repeat TSH due to recent med adjustments.  Denies current sxs.   Review of Systems For ROS see HPI     Objective:   Physical Exam  Constitutional: He is oriented to person, place, and time. He appears well-developed and well-nourished. No distress.  HENT:  Head: Normocephalic and atraumatic.  Neck: Normal range of motion. Neck supple. No thyromegaly present.  Cardiovascular: Normal rate, regular rhythm, normal heart sounds and intact distal pulses.   Pt w/ intact DP pulses bilaterally  Pulmonary/Chest: Effort normal and breath sounds normal. No respiratory distress. He has no wheezes. He has no rales.  Musculoskeletal: He exhibits no edema or tenderness.  Lymphadenopathy:    He has no cervical adenopathy.  Neurological: He is alert and oriented to person, place, and time.  Skin: Skin is warm and dry.  Lower legs are hairless bilaterally  Psychiatric: He has a normal mood and affect. His behavior is normal.  Vitals reviewed.         Assessment & Plan:

## 2014-11-20 NOTE — Patient Instructions (Signed)
Follow up as scheduled We'll notify you of your lab results and make any changes if needed We'll call you with your vascular appt START an 81mg  aspirin daily- i sent a prescription but it may be cheaper to buy over the counter Call with any questions or concerns Enjoy the rest of your summer!!

## 2014-11-22 NOTE — Assessment & Plan Note (Signed)
Ongoing issue for pt.  His TSH was low at last visit and meds were adjusted.  Recheck today and again adjust meds prn.  Pt expressed understanding and is in agreement w/ plan.

## 2014-11-22 NOTE — Assessment & Plan Note (Signed)
New.  Pt does have risk factors for PVD- age, hx of tobacco abuse, obesity- and his hairless, shiny skin of lower legs bilaterally would be consistent w/ this.  Goal is good BP control- which pt has, lipid control w/ a statin- which pt is on.  Add ASA.  Refer to vascular for complete evaluation.  Pt expressed understanding and is in agreement w/ plan.

## 2014-11-23 ENCOUNTER — Encounter: Payer: Self-pay | Admitting: General Practice

## 2014-11-25 ENCOUNTER — Other Ambulatory Visit: Payer: 59

## 2014-11-26 ENCOUNTER — Telehealth: Payer: Self-pay | Admitting: Family Medicine

## 2014-11-26 ENCOUNTER — Other Ambulatory Visit: Payer: Self-pay | Admitting: Internal Medicine

## 2014-11-26 NOTE — Telephone Encounter (Signed)
Pt notified of results

## 2014-11-26 NOTE — Telephone Encounter (Signed)
°  Relation to [place]:SORT Call back [facility]:  Reason for call: pt states he never received a call regarding his recent lab results

## 2014-11-27 ENCOUNTER — Encounter: Payer: Self-pay | Admitting: Vascular Surgery

## 2014-12-01 ENCOUNTER — Other Ambulatory Visit: Payer: Self-pay | Admitting: Adult Health

## 2014-12-02 ENCOUNTER — Other Ambulatory Visit: Payer: Self-pay

## 2014-12-02 DIAGNOSIS — I739 Peripheral vascular disease, unspecified: Secondary | ICD-10-CM

## 2014-12-11 ENCOUNTER — Encounter: Payer: Self-pay | Admitting: Vascular Surgery

## 2014-12-15 ENCOUNTER — Ambulatory Visit (HOSPITAL_COMMUNITY)
Admission: RE | Admit: 2014-12-15 | Discharge: 2014-12-15 | Disposition: A | Discharge status: Home / Self Care | Payer: 59 | Source: Ambulatory Visit | Attending: Vascular Surgery | Admitting: Vascular Surgery

## 2014-12-15 ENCOUNTER — Ambulatory Visit (INDEPENDENT_AMBULATORY_CARE_PROVIDER_SITE_OTHER): Payer: 59 | Admitting: Vascular Surgery

## 2014-12-15 ENCOUNTER — Encounter: Payer: Self-pay | Admitting: Vascular Surgery

## 2014-12-15 VITALS — BP 115/71 | HR 53 | Temp 97.4°F | Resp 14 | Ht 72.0 in | Wt 189.0 lb

## 2014-12-15 DIAGNOSIS — L819 Disorder of pigmentation, unspecified: Secondary | ICD-10-CM

## 2014-12-15 DIAGNOSIS — M7989 Other specified soft tissue disorders: Secondary | ICD-10-CM | POA: Diagnosis not present

## 2014-12-15 DIAGNOSIS — I739 Peripheral vascular disease, unspecified: Secondary | ICD-10-CM | POA: Diagnosis present

## 2014-12-15 DIAGNOSIS — R785 Finding of other psychotropic drug in blood: Secondary | ICD-10-CM | POA: Diagnosis not present

## 2014-12-15 NOTE — Progress Notes (Signed)
[facility] [place]  Patient name: Jeremy Walker MRN: [id number] DOB: Aug 09, 1950 Sex: male  REASON FOR CONSULT: Evaluate for peripheral vascular disease. Referred by Dr. Birdie Riddle.  HPI: Jeremy Walker is a 64 y.o. male who was referred for evaluation of peripheral vascular disease. He had noticed some slight discoloration of his feet and was concerned about peripheral vascular disease. Of note he denies any history of claudication or rest pain. He denies any history of nonhealing ulcers.  His risk factors for peripheral vascular disease include hyperlipidemia and a history of tobacco use in the past. He quit 2 years ago. He denies any history of diabetes, hypertension, or family history of premature cardiovascular disease.  I have reviewed the records from Dr. Virgil Benedict office. He was seen there with hypothyroidism and also peripheral vascular disease with claudication. His blood pressure is under good control. He is on a statin for his cholesterol. Aspirin was added to his regimen.  Past Medical History  Diagnosis Date  . Sudden cardiac death     aborted  . Automatic implantable cardiac defibrillator     Abdominal implant was 0064 Guidant lead 6/5 mm  . Hypokalemia     Recurrent associated with cardiac arrest  . Tobacco abuse     02-16-14 past hx. heavy smoker, quit < 4yr.  Marland Kitchen Anxiety   . Asthma   . Depression   . HLD (hyperlipidemia)   . Thyroid disease    Family History  Problem Relation Age of Onset  . Lung cancer Brother     smoker  . Cancer Father     bladder or kidney, mets  . Parkinson's disease Mother   . Scoliosis Mother   . Ovarian cancer Mother   . Diabetes Mother   . Heart failure Mother     CHF  . Diabetes Brother   . Diabetes Maternal Aunt    SOCIAL HISTORY: Social History  Substance Use Topics  . Smoking status: Former Smoker -- 1.50 packs/day for 42 years    Types: Cigarettes    Quit date: 04/10/2012  . Smokeless tobacco: Never Used    . Alcohol Use: No     Comment: quit drinking about 1 year ago/ 01/29/14   No Known Allergies Current Outpatient Prescriptions  Medication Sig Dispense Refill  . albuterol (PROVENTIL HFA;VENTOLIN HFA) 108 (90 BASE) MCG/ACT inhaler Inhale 1-2 puffs into the lungs every 6 (six) hours as needed for wheezing or shortness of breath.    Marland Kitchen aluminum hydroxide-magnesium carbonate (GAVISCON) 95-358 MG/15ML SUSP Take 30 mLs by mouth 4 (four) times daily - after meals and at bedtime. 355 mL 3  . aspirin EC 81 MG tablet Take 1 tablet (81 mg total) by mouth daily. 90 tablet 3  . atenolol (TENORMIN) 50 MG tablet Take 1 tablet (50 mg total) by mouth daily. 90 tablet 0  . atenolol (TENORMIN) 50 MG tablet TAKE ONE TABLET BY MOUTH ONCE DAILY 90 tablet 0  . levothyroxine (SYNTHROID, LEVOTHROID) 50 MCG tablet Take 1 tablet (50 mcg total) by mouth daily. 30 tablet 6  . naproxen sodium (ANAPROX DS) 550 MG tablet Take 1 tablet (550 mg total) by mouth 2 (two) times daily with a meal. 40 tablet 0  . pantoprazole (PROTONIX) 40 MG tablet TAKE 1 TABLET BY MOUTH TWICE DAILY 60 tablet 3  . simvastatin (ZOCOR) 20 MG tablet Take 1 tablet (20 mg total) by mouth at bedtime. 90 tablet 1   No current facility-administered medications for  this visit.   REVIEW OF SYSTEMS: Valu.Nieves ] denotes positive finding; [  ] denotes negative finding  CARDIOVASCULAR:  [ ]  chest pain   [ ]  chest pressure   [ ]  palpitations   [ ]  orthopnea   Valu.Nieves ] dyspnea on exertion   [ ]  claudication   [ ]  rest pain   [ ]  DVT   [ ]  phlebitis PULMONARY:   [ ]  productive cough   [ ]  asthma   [ ]  wheezing NEUROLOGIC:   [ ]  weakness  [ ]  paresthesias  [ ]  aphasia  [ ]  amaurosis  [ ]  dizziness HEMATOLOGIC:   [ ]  bleeding problems   [ ]  clotting disorders MUSCULOSKELETAL:  [ ]  joint pain   [ ]  joint swelling Valu.Nieves ] leg swelling GASTROINTESTINAL: [ ]   blood in stool  [ ]   hematemesis GENITOURINARY:  [ ]   dysuria  [ ]   hematuria PSYCHIATRIC:  [ ]  history of major  depression INTEGUMENTARY:  [ ]  rashes  [ ]  ulcers CONSTITUTIONAL:  [ ]  fever   [ ]  chills  PHYSICAL EXAM: There were no vitals filed for this visit. GENERAL: The patient is a well-nourished male, in no acute distress. The vital signs are documented above. CARDIAC: There is a regular rate and rhythm.  VASCULAR: I do not detect carotid bruits. On the right side, he has a palpable femoral, popliteal, dorsalis pedis, and posterior tibial pulse. On the left side, he has a palpable femoral, popliteal, and posterior tibial pulse. I cannot palpate a dorsalis pedis pulse. He has mild left lower extremity swelling. PULMONARY: There is good air exchange bilaterally without wheezing or rales. ABDOMEN: Soft and non-tender with normal pitched bowel sounds.  MUSCULOSKELETAL: There are no major deformities or cyanosis. NEUROLOGIC: No focal weakness or paresthesias are detected. SKIN: He has some mild hyperpigmentation consistent with chronic venous insufficiency. PSYCHIATRIC: The patient has a normal affect.  DATA:  ARTERIAL DOPPLER STUDY: I have independently interpreted his arterial Doppler study which shows triphasic Doppler signals in the dorsalis pedis and posterior tibial positions bilaterally. ABIs are 100% bilaterally. Toe pressures are normal bilaterally.  MEDICAL ISSUES:  CHRONIC VENOUS INSUFFICIENCY: I reassured him that I do not think that he has any evidence of significant peripheral vascular disease.  He has palpable pedal pulses, normal ABI's,  and triphasic Doppler signals. I think some of the discoloration his feet could be related to chronic venous insufficiency. He does have some mild leg swelling especially on the left and some hyperpigmentation. We have discussed the importance of intermittent leg elevation and the proper positioning for this. In addition I have written him a prescription for a knee-high compression stocking with a mild gradient (15-20 mmHg).   Deitra Mayo [facility] [place]: [phone]

## 2015-03-06 ENCOUNTER — Other Ambulatory Visit: Payer: Self-pay | Admitting: Internal Medicine

## 2015-03-11 DIAGNOSIS — I2510 Atherosclerotic heart disease of native coronary artery without angina pectoris: Secondary | ICD-10-CM

## 2015-03-11 HISTORY — DX: Atherosclerotic heart disease of native coronary artery without angina pectoris: I25.10

## 2015-03-16 ENCOUNTER — Encounter: Payer: Self-pay | Admitting: Internal Medicine

## 2015-03-16 ENCOUNTER — Ambulatory Visit (INDEPENDENT_AMBULATORY_CARE_PROVIDER_SITE_OTHER): Payer: 59 | Admitting: Internal Medicine

## 2015-03-16 VITALS — BP 110/78 | HR 56 | Ht 72.0 in | Wt 201.2 lb

## 2015-03-16 DIAGNOSIS — R0609 Other forms of dyspnea: Secondary | ICD-10-CM | POA: Diagnosis not present

## 2015-03-16 DIAGNOSIS — I4901 Ventricular fibrillation: Secondary | ICD-10-CM | POA: Diagnosis not present

## 2015-03-16 NOTE — Patient Instructions (Addendum)
Medication Instructions: Your physician recommends that you continue on your current medications as directed. Please refer to the Current Medication list given to you today.  Labwork: No new orders.   Procedures/Testing: Your physician has requested that you have a lexiscan myoview. For further information please visit [email or web address]. Please follow instruction sheet, as given.  Follow-Up: Your physician wants you to follow-up in: 6 MONTHS with [facility].  You will receive a reminder letter in the mail two months in advance. If you don't receive a letter, please call our office to schedule the follow-up appointment.   Your physician wants you to follow-up in: 1 YEAR with Dr Caryl Comes.  You will receive a reminder letter in the mail two months in advance. If you don't receive a letter, please call our office to schedule the follow-up appointment.   Any Additional Special Instructions Will Be Listed Below (If Applicable).

## 2015-03-16 NOTE — Progress Notes (Signed)
Patient Care Team: Midge Minium, MD as PCP - General (Family Medicine) Deboraha Sprang, MD as Consulting Physician (Cardiology)   HPI  Jeremy Walker is a 64 y.o. male Followup for a reported cardiac arrest that happened recurrently many years ago. On both occasions he were associated with profound hypokalemia the cause of which was never elucidated. He is status post ICD implantation and prior generator replacement.he underwent generator replacement again June 2012    He has stopped smoking [facility]!!!!!!!   He has complaints of dyspnea on exertion. This is unassociated with chest pain. He has not had any edema. He is aware that he has put on weight since he stopped smoking.       Past Medical History      Past Medical History  Diagnosis Date  . Sudden cardiac death [facility])     aborted  . Automatic implantable cardiac defibrillator     Abdominal implant was 0064 Guidant lead 6/5 mm  . Hypokalemia     Recurrent associated with cardiac arrest  . Tobacco abuse     02-16-14 past hx. heavy smoker, quit < 19yr.  Marland Kitchen Anxiety   . Asthma   . Depression   . HLD (hyperlipidemia)   . Thyroid disease   . Atrial fibrillation [facility])     Past Surgical History  Procedure Laterality Date  . Cardiac defibrillator placement  1991    guidant  . Hand surgery Left 1965  . Eus N/A 02/26/2014    Procedure: ESOPHAGEAL ENDOSCOPIC ULTRASOUND (EUS) RADIAL;  Surgeon: Milus Banister, MD;  Location: [facility] ENDOSCOPY;  Service: Endoscopy;  Laterality: N/A;  radial linear    Current Outpatient Prescriptions  Medication Sig Dispense Refill  . albuterol (PROVENTIL HFA;VENTOLIN HFA) 108 (90 BASE) MCG/ACT inhaler Inhale 1-2 puffs into the lungs every 6 (six) hours as needed for wheezing or shortness of breath.    Marland Kitchen aluminum hydroxide-magnesium carbonate (GAVISCON) 95-358 MG/15ML SUSP Take 30 mLs by mouth 4 (four) times daily - after meals and at bedtime. (Patient taking differently: Take 30 mLs by  mouth as needed. ) 355 mL 3  . levothyroxine (SYNTHROID, LEVOTHROID) 50 MCG tablet Take 50 mcg by mouth daily.    . pantoprazole (PROTONIX) 40 MG tablet Take 1 tablet by mouth 2 (two) times daily.     . simvastatin (ZOCOR) 20 MG tablet Take 20 mg by mouth daily.    Marland Kitchen aspirin EC 81 MG tablet Take 1 tablet (81 mg total) by mouth daily. 90 tablet 3  . atenolol (TENORMIN) 50 MG tablet TAKE ONE TABLET BY MOUTH ONCE DAILY 90 tablet 0   No current facility-administered medications for this visit.    No Known Allergies  Review of Systems negative except from HPI and PMH  Physical Exam BP 110/78 mmHg  Pulse 56  Ht 6' (1.829 m)  Wt 201 lb 3.2 oz (91.264 kg)  BMI 27.28 kg/m2  SpO2 96% Well developed and well nourished in no acute distress HENT normal E scleral and icterus clear Neck Supple JVP flat; carotids brisk and full Clear to ausculation  Regular rate and rhythm, no murmurs gallops or rub Soft with active bowel sounds No clubbing cyanosis none Edema Alert and oriented, grossly normal motor and sensory function Skin Warm and Dry  ECG NSR  18/09/43  Assessment and  Plan  Aborted ardiac arrest  No intercurrent Ventricular tachycardia  Implantable defibrillator  The patient's device was interrogated.  The information was  reviewed. No changes were made in the programming.    Dyspnea on exertion  The patient has worsening dyspnea. This may be related to his weight gain. He is no longer smoking.  Chest x-ray from 2012 demonstrated hyperinflation. There has been no interval imaging  We will undertake a Myoview scan looking for both LV function and ischemia given his long-standing smoking and dyslipidemia

## 2015-03-18 LAB — CUP PACEART INCLINIC DEVICE CHECK
Date Time Interrogation Session: 20161208100656
MDC IDC LEAD IMPLANT DT: 19940211
MDC IDC LEAD LOCATION: 753860
MDC IDC LEAD SERIAL: 5495
Pulse Gen Serial Number: 115351

## 2015-03-24 ENCOUNTER — Telehealth (HOSPITAL_COMMUNITY): Payer: Self-pay | Admitting: *Deleted

## 2015-03-24 NOTE — Telephone Encounter (Signed)
Patient given detailed instructions per Myocardial Perfusion Study Information Sheet for the test on 03/26/15 at 815 Patient notified to arrive 15 minutes early and that it is imperative to arrive on time for appointment to keep from having the test rescheduled.  If you need to cancel or reschedule your appointment, please call the office within 24 hours of your appointment. Failure to do so may result in a cancellation of your appointment, and a $50 no show fee. Patient verbalized understanding.Hubbard Robinson, RN

## 2015-03-26 ENCOUNTER — Ambulatory Visit (HOSPITAL_COMMUNITY): Payer: 59 | Attending: Cardiology

## 2015-03-26 DIAGNOSIS — R9439 Abnormal result of other cardiovascular function study: Secondary | ICD-10-CM | POA: Insufficient documentation

## 2015-03-26 DIAGNOSIS — R0609 Other forms of dyspnea: Secondary | ICD-10-CM | POA: Insufficient documentation

## 2015-03-26 DIAGNOSIS — I739 Peripheral vascular disease, unspecified: Secondary | ICD-10-CM | POA: Insufficient documentation

## 2015-03-26 DIAGNOSIS — I4891 Unspecified atrial fibrillation: Secondary | ICD-10-CM | POA: Diagnosis not present

## 2015-03-26 LAB — MYOCARDIAL PERFUSION IMAGING
CHL CUP NUCLEAR SDS: 7
CHL CUP NUCLEAR SSS: 12
LHR: 0.28
LV dias vol: 100 mL
LV sys vol: 44 mL
Peak HR: 88 {beats}/min
Rest HR: 52 {beats}/min
SRS: 5
TID: 0.99

## 2015-03-26 MED ORDER — TECHNETIUM TC 99M SESTAMIBI GENERIC - CARDIOLITE
10.2000 | Freq: Once | INTRAVENOUS | Status: AC | PRN
  Administered 2015-03-26: 10 via INTRAVENOUS

## 2015-03-26 MED ORDER — TECHNETIUM TC 99M SESTAMIBI GENERIC - CARDIOLITE
32.7000 | Freq: Once | INTRAVENOUS | Status: AC | PRN
Start: 2015-03-26 — End: 2015-03-26
  Administered 2015-03-26: 32.7 via INTRAVENOUS

## 2015-03-26 MED ORDER — REGADENOSON 0.4 MG/5ML IV SOLN
0.4000 mg | Freq: Once | INTRAVENOUS | Status: AC
  Administered 2015-03-26: 0.4 mg via INTRAVENOUS

## 2015-03-29 ENCOUNTER — Other Ambulatory Visit: Payer: Self-pay | Admitting: Internal Medicine

## 2015-03-30 ENCOUNTER — Telehealth: Payer: Self-pay | Admitting: *Deleted

## 2015-03-30 ENCOUNTER — Encounter: Payer: Self-pay | Admitting: *Deleted

## 2015-03-30 DIAGNOSIS — R0600 Dyspnea, unspecified: Secondary | ICD-10-CM

## 2015-03-30 DIAGNOSIS — R9439 Abnormal result of other cardiovascular function study: Secondary | ICD-10-CM

## 2015-03-30 NOTE — Telephone Encounter (Signed)
[place], [place] - 03/26/15   Deboraha Sprang, MD  Sent: Mon March 29, 2015 8:34 AM   To: Emily Filbert, RN; Midge Minium, MD     Result Note    Please Inform Patient that stress test showed evidence of ischemia, Normal  heart muscle function So no evidence of damage at this point but he needs a cath; I have spoken with the patient and would like to schedule for next week       Thanks     I arranged cath for December 29,2016 at 7:30 with Dr. Angelena Form.

## 2015-03-30 NOTE — Telephone Encounter (Signed)
I spoke with pt and he can be at hospital for cath on December 29.  He will come in for lab work on December 27.  I verbally went over all instructions with pt and told him I would leave printed copy of instructions at front desk for him to pick up when he comes in for lab work on December 27.

## 2015-04-06 ENCOUNTER — Other Ambulatory Visit (INDEPENDENT_AMBULATORY_CARE_PROVIDER_SITE_OTHER): Payer: 59 | Admitting: *Deleted

## 2015-04-06 DIAGNOSIS — R0600 Dyspnea, unspecified: Secondary | ICD-10-CM | POA: Diagnosis not present

## 2015-04-06 DIAGNOSIS — R9439 Abnormal result of other cardiovascular function study: Secondary | ICD-10-CM

## 2015-04-06 LAB — BASIC METABOLIC PANEL
BUN: 15 mg/dL (ref 7–25)
CALCIUM: 9.5 mg/dL (ref 8.6–10.3)
CO2: 28 mmol/L (ref 20–31)
CREATININE: 1.14 mg/dL (ref 0.70–1.25)
Chloride: 104 mmol/L (ref 98–110)
Glucose, Bld: 90 mg/dL (ref 65–99)
Potassium: 4.5 mmol/L (ref 3.5–5.3)
Sodium: 141 mmol/L (ref 135–146)

## 2015-04-06 LAB — CBC WITH DIFFERENTIAL/PLATELET
BASOS ABS: 0 10*3/uL (ref 0.0–0.1)
BASOS PCT: 1 % (ref 0–1)
EOS ABS: 0.1 10*3/uL (ref 0.0–0.7)
Eosinophils Relative: 2 % (ref 0–5)
HCT: 44.6 % (ref 39.0–52.0)
Hemoglobin: 15.1 g/dL (ref 13.0–17.0)
Lymphocytes Relative: 34 % (ref 12–46)
Lymphs Abs: 1.7 10*3/uL (ref 0.7–4.0)
MCH: 32.3 pg (ref 26.0–34.0)
MCHC: 33.9 g/dL (ref 30.0–36.0)
MCV: 95.5 fL (ref 78.0–100.0)
MONO ABS: 0.5 10*3/uL (ref 0.1–1.0)
MONOS PCT: 11 % (ref 3–12)
MPV: 12.4 fL (ref 8.6–12.4)
NEUTROS PCT: 52 % (ref 43–77)
Neutro Abs: 2.5 10*3/uL (ref 1.7–7.7)
PLATELETS: 119 10*3/uL — AB (ref 150–400)
RBC: 4.67 MIL/uL (ref 4.22–5.81)
RDW: 13 % (ref 11.5–15.5)
WBC: 4.9 10*3/uL (ref 4.0–10.5)

## 2015-04-06 LAB — PROTIME-INR
INR: 0.93 (ref <1.50)
PROTHROMBIN TIME: 12.6 s (ref 11.6–15.2)

## 2015-04-08 ENCOUNTER — Encounter (HOSPITAL_COMMUNITY)
Admission: RE | Disposition: A | Discharge status: Home / Self Care | Payer: Self-pay | Source: Ambulatory Visit | Attending: Cardiovascular Disease

## 2015-04-08 ENCOUNTER — Ambulatory Visit (HOSPITAL_COMMUNITY)
Admission: RE | Admit: 2015-04-08 | Discharge: 2015-04-09 | Disposition: A | Discharge status: Home / Self Care | Payer: 59 | Source: Ambulatory Visit | Attending: Cardiovascular Disease | Admitting: Cardiovascular Disease

## 2015-04-08 ENCOUNTER — Ambulatory Visit (HOSPITAL_COMMUNITY): Payer: 59

## 2015-04-08 ENCOUNTER — Encounter (HOSPITAL_COMMUNITY): Payer: Self-pay | Admitting: Cardiovascular Disease

## 2015-04-08 DIAGNOSIS — E079 Disorder of thyroid, unspecified: Secondary | ICD-10-CM | POA: Insufficient documentation

## 2015-04-08 DIAGNOSIS — R635 Abnormal weight gain: Secondary | ICD-10-CM | POA: Insufficient documentation

## 2015-04-08 DIAGNOSIS — I4891 Unspecified atrial fibrillation: Secondary | ICD-10-CM | POA: Diagnosis not present

## 2015-04-08 DIAGNOSIS — Z7982 Long term (current) use of aspirin: Secondary | ICD-10-CM | POA: Diagnosis not present

## 2015-04-08 DIAGNOSIS — I10 Essential (primary) hypertension: Secondary | ICD-10-CM | POA: Diagnosis not present

## 2015-04-08 DIAGNOSIS — I872 Venous insufficiency (chronic) (peripheral): Secondary | ICD-10-CM | POA: Insufficient documentation

## 2015-04-08 DIAGNOSIS — Z955 Presence of coronary angioplasty implant and graft: Secondary | ICD-10-CM

## 2015-04-08 DIAGNOSIS — I200 Unstable angina: Secondary | ICD-10-CM | POA: Insufficient documentation

## 2015-04-08 DIAGNOSIS — Z95810 Presence of automatic (implantable) cardiac defibrillator: Secondary | ICD-10-CM | POA: Diagnosis not present

## 2015-04-08 DIAGNOSIS — E782 Mixed hyperlipidemia: Secondary | ICD-10-CM | POA: Diagnosis present

## 2015-04-08 DIAGNOSIS — Z79899 Other long term (current) drug therapy: Secondary | ICD-10-CM | POA: Diagnosis not present

## 2015-04-08 DIAGNOSIS — Z8674 Personal history of sudden cardiac arrest: Secondary | ICD-10-CM | POA: Insufficient documentation

## 2015-04-08 DIAGNOSIS — Z6828 Body mass index (BMI) 28.0-28.9, adult: Secondary | ICD-10-CM | POA: Insufficient documentation

## 2015-04-08 DIAGNOSIS — I25110 Atherosclerotic heart disease of native coronary artery with unstable angina pectoris: Secondary | ICD-10-CM | POA: Diagnosis not present

## 2015-04-08 DIAGNOSIS — I2582 Chronic total occlusion of coronary artery: Secondary | ICD-10-CM | POA: Diagnosis not present

## 2015-04-08 DIAGNOSIS — E785 Hyperlipidemia, unspecified: Secondary | ICD-10-CM | POA: Insufficient documentation

## 2015-04-08 DIAGNOSIS — Z87891 Personal history of nicotine dependence: Secondary | ICD-10-CM | POA: Diagnosis not present

## 2015-04-08 DIAGNOSIS — J45909 Unspecified asthma, uncomplicated: Secondary | ICD-10-CM | POA: Insufficient documentation

## 2015-04-08 HISTORY — DX: Atherosclerotic heart disease of native coronary artery without angina pectoris: I25.10

## 2015-04-08 HISTORY — PX: CARDIAC CATHETERIZATION: SHX172

## 2015-04-08 HISTORY — DX: Personal history of other diseases of the digestive system: Z87.19

## 2015-04-08 HISTORY — DX: Presence of automatic (implantable) cardiac defibrillator: Z95.810

## 2015-04-08 HISTORY — DX: Unspecified asthma, uncomplicated: J45.909

## 2015-04-08 HISTORY — DX: Hypothyroidism, unspecified: E03.9

## 2015-04-08 HISTORY — DX: Gastro-esophageal reflux disease without esophagitis: K21.9

## 2015-04-08 LAB — POCT ACTIVATED CLOTTING TIME
ACTIVATED CLOTTING TIME: 332 s
Activated Clotting Time: 250 seconds

## 2015-04-08 SURGERY — LEFT HEART CATH AND CORONARY ANGIOGRAPHY

## 2015-04-08 MED ORDER — SODIUM CHLORIDE 0.9 % IJ SOLN: 3.0000 mL | Freq: Two times a day (BID) | INTRAMUSCULAR | Status: DC

## 2015-04-08 MED ORDER — NITROGLYCERIN 1 MG/10 ML FOR IR/CATH LAB
INTRA_ARTERIAL | Status: DC | PRN
  Administered 2015-04-08: 200 ug via INTRACORONARY

## 2015-04-08 MED ORDER — SODIUM CHLORIDE 0.9 % IV SOLN: 250.0000 mL | INTRAVENOUS | Status: DC | PRN

## 2015-04-08 MED ORDER — HEPARIN SODIUM (PORCINE) 1000 UNIT/ML IJ SOLN
INTRAMUSCULAR | Status: AC
  Filled 2015-04-08: qty 1

## 2015-04-08 MED ORDER — SODIUM CHLORIDE 0.9 % WEIGHT BASED INFUSION
3.0000 mL/kg/h | INTRAVENOUS | Status: DC
  Administered 2015-04-08: 06:00:00 via INTRAVENOUS

## 2015-04-08 MED ORDER — ACETAMINOPHEN 325 MG PO TABS: 650.0000 mg | ORAL_TABLET | ORAL | Status: DC | PRN

## 2015-04-08 MED ORDER — ALBUTEROL SULFATE HFA 108 (90 BASE) MCG/ACT IN AERS: 1.0000 | INHALATION_SPRAY | Freq: Four times a day (QID) | RESPIRATORY_TRACT | Status: DC | PRN

## 2015-04-08 MED ORDER — SODIUM CHLORIDE 0.9 % WEIGHT BASED INFUSION: 3.0000 mL/kg/h | INTRAVENOUS | Status: DC

## 2015-04-08 MED ORDER — ATENOLOL 50 MG PO TABS
50.0000 mg | ORAL_TABLET | Freq: Every day | ORAL | Status: DC
  Administered 2015-04-09: 50 mg via ORAL
  Filled 2015-04-08: qty 1

## 2015-04-08 MED ORDER — VERAPAMIL HCL 2.5 MG/ML IV SOLN
INTRAVENOUS | Status: AC
Start: 2015-04-08 — End: 2015-04-08
  Filled 2015-04-08: qty 2

## 2015-04-08 MED ORDER — CLOPIDOGREL BISULFATE 300 MG PO TABS
ORAL_TABLET | ORAL | Status: AC
Start: 2015-04-08 — End: 2015-04-08
  Filled 2015-04-08: qty 2

## 2015-04-08 MED ORDER — HEPARIN (PORCINE) IN NACL 2-0.9 UNIT/ML-% IJ SOLN
INTRAMUSCULAR | Status: AC
  Filled 2015-04-08: qty 1000

## 2015-04-08 MED ORDER — CLOPIDOGREL BISULFATE 75 MG PO TABS
75.0000 mg | ORAL_TABLET | Freq: Every day | ORAL | Status: DC
  Administered 2015-04-09: 75 mg via ORAL
  Filled 2015-04-08: qty 1

## 2015-04-08 MED ORDER — NITROGLYCERIN 1 MG/10 ML FOR IR/CATH LAB
INTRA_ARTERIAL | Status: DC | PRN
  Administered 2015-04-08: 09:00:00

## 2015-04-08 MED ORDER — FENTANYL CITRATE (PF) 100 MCG/2ML IJ SOLN
INTRAMUSCULAR | Status: DC | PRN
  Administered 2015-04-08 (×2): 25 ug via INTRAVENOUS

## 2015-04-08 MED ORDER — SODIUM CHLORIDE 0.9 % WEIGHT BASED INFUSION
1.0000 mL/kg/h | INTRAVENOUS | Status: DC
  Administered 2015-04-08: 07:00:00 via INTRAVENOUS

## 2015-04-08 MED ORDER — FENTANYL CITRATE (PF) 100 MCG/2ML IJ SOLN
INTRAMUSCULAR | Status: AC
  Filled 2015-04-08: qty 2

## 2015-04-08 MED ORDER — SIMVASTATIN 20 MG PO TABS
20.0000 mg | ORAL_TABLET | Freq: Every day | ORAL | Status: DC
  Administered 2015-04-08: 20 mg via ORAL
  Filled 2015-04-08: qty 1

## 2015-04-08 MED ORDER — CLOPIDOGREL BISULFATE 300 MG PO TABS
ORAL_TABLET | ORAL | Status: DC | PRN
  Administered 2015-04-08: 600 mg via ORAL

## 2015-04-08 MED ORDER — ANGIOPLASTY BOOK
Freq: Once | Status: AC
  Administered 2015-04-08: 22:00:00
  Filled 2015-04-08: qty 1

## 2015-04-08 MED ORDER — MIDAZOLAM HCL 2 MG/2ML IJ SOLN
INTRAMUSCULAR | Status: AC
  Filled 2015-04-08: qty 2

## 2015-04-08 MED ORDER — ASPIRIN 81 MG PO CHEW: 81.0000 mg | CHEWABLE_TABLET | ORAL | Status: DC

## 2015-04-08 MED ORDER — MIDAZOLAM HCL 2 MG/2ML IJ SOLN
INTRAMUSCULAR | Status: DC | PRN
  Administered 2015-04-08: 1 mg via INTRAVENOUS
  Administered 2015-04-08: 2 mg via INTRAVENOUS

## 2015-04-08 MED ORDER — HEPARIN SODIUM (PORCINE) 1000 UNIT/ML IJ SOLN
INTRAMUSCULAR | Status: DC | PRN
  Administered 2015-04-08: 2500 [IU] via INTRAVENOUS
  Administered 2015-04-08: 4500 [IU] via INTRAVENOUS
  Administered 2015-04-08: 6000 [IU] via INTRAVENOUS
  Administered 2015-04-08: 2500 [IU] via INTRAVENOUS

## 2015-04-08 MED ORDER — LIDOCAINE HCL (PF) 1 % IJ SOLN
INTRAMUSCULAR | Status: DC | PRN
  Administered 2015-04-08: 2 mL

## 2015-04-08 MED ORDER — SODIUM CHLORIDE 0.9 % IJ SOLN: 3.0000 mL | INTRAMUSCULAR | Status: DC | PRN

## 2015-04-08 MED ORDER — VERAPAMIL HCL 2.5 MG/ML IV SOLN
INTRAVENOUS | Status: DC | PRN
  Administered 2015-04-08: 08:00:00 via INTRA_ARTERIAL

## 2015-04-08 MED ORDER — IOHEXOL 350 MG/ML SOLN
INTRAVENOUS | Status: DC | PRN
  Administered 2015-04-08 (×2): 100 mL via INTRAVENOUS
  Administered 2015-04-08: 50 mL via INTRAVENOUS

## 2015-04-08 MED ORDER — SODIUM CHLORIDE 0.9 % IJ SOLN
3.0000 mL | INTRAMUSCULAR | Status: DC | PRN
Start: 2015-04-08 — End: 2015-04-08

## 2015-04-08 MED ORDER — LEVOTHYROXINE SODIUM 50 MCG PO TABS
50.0000 ug | ORAL_TABLET | Freq: Every day | ORAL | Status: DC
  Administered 2015-04-09: 50 ug via ORAL
  Filled 2015-04-08: qty 1

## 2015-04-08 MED ORDER — PANTOPRAZOLE SODIUM 40 MG PO TBEC
40.0000 mg | DELAYED_RELEASE_TABLET | Freq: Two times a day (BID) | ORAL | Status: DC
Start: 2015-04-08 — End: 2015-04-09
  Administered 2015-04-08 – 2015-04-09 (×2): 40 mg via ORAL
  Filled 2015-04-08 (×2): qty 1

## 2015-04-08 MED ORDER — ONDANSETRON HCL 4 MG/2ML IJ SOLN: 4.0000 mg | Freq: Four times a day (QID) | INTRAMUSCULAR | Status: DC | PRN

## 2015-04-08 MED ORDER — ASPIRIN EC 81 MG PO TBEC
81.0000 mg | DELAYED_RELEASE_TABLET | Freq: Every day | ORAL | Status: DC
  Administered 2015-04-09: 81 mg via ORAL
  Filled 2015-04-08: qty 1

## 2015-04-08 MED ORDER — SODIUM CHLORIDE 0.9 % IV SOLN
INTRAVENOUS | Status: AC
  Administered 2015-04-08: 10:00:00 via INTRAVENOUS

## 2015-04-08 MED ORDER — SODIUM CHLORIDE 0.9 % WEIGHT BASED INFUSION: 1.0000 mL/kg/h | INTRAVENOUS | Status: DC

## 2015-04-08 MED ORDER — LIDOCAINE HCL (PF) 1 % IJ SOLN
INTRAMUSCULAR | Status: AC
  Filled 2015-04-08: qty 30

## 2015-04-08 MED ORDER — NITROGLYCERIN 1 MG/10 ML FOR IR/CATH LAB
INTRA_ARTERIAL | Status: AC
  Filled 2015-04-08: qty 10

## 2015-04-08 SURGICAL SUPPLY — 27 items
BALLN MAVERICK OTW 2.0X15 (BALLOONS) ×3
BALLN ~~LOC~~ EUPHORA RX 2.5X15 (BALLOONS) ×3
BALLN ~~LOC~~ EUPHORA RX 2.75X20 (BALLOONS) ×3
BALLOON MAVERICK OTW 2.0X15 (BALLOONS) ×1 IMPLANT
BALLOON ~~LOC~~ EUPHORA RX 2.5X15 (BALLOONS) ×1 IMPLANT
BALLOON ~~LOC~~ EUPHORA RX 2.75X20 (BALLOONS) ×1 IMPLANT
CATH INFINITI 5 FR JL3.5 (CATHETERS) ×3 IMPLANT
CATH INFINITI 5FR ANG PIGTAIL (CATHETERS) ×3 IMPLANT
CATH INFINITI 5FR JL4 (CATHETERS) ×3 IMPLANT
CATH INFINITI JR4 5F (CATHETERS) ×3 IMPLANT
CATH VISTA GUIDE 6FR XBLAD3.5 (CATHETERS) ×3 IMPLANT
DEVICE RAD COMP TR BAND LRG (VASCULAR PRODUCTS) ×3 IMPLANT
GLIDESHEATH SLEND SS 6F .021 (SHEATH) ×3 IMPLANT
GUIDE CATH RUNWAY 6FR AL 1 (CATHETERS) ×3 IMPLANT
GUIDE CATH RUNWAY 6FR FR4 (CATHETERS) ×3 IMPLANT
KIT ENCORE 26 ADVANTAGE (KITS) ×2 IMPLANT
KIT HEART LEFT (KITS) ×3 IMPLANT
PACK CARDIAC CATHETERIZATION (CUSTOM PROCEDURE TRAY) ×3 IMPLANT
STENT PROMUS PREM MR 2.25X16 (Permanent Stent) ×3 IMPLANT
STENT PROMUS PREM MR 2.75X32 (Permanent Stent) ×3 IMPLANT
SYR MEDRAD MARK V 150ML (SYRINGE) ×3 IMPLANT
TRANSDUCER W/STOPCOCK (MISCELLANEOUS) ×3 IMPLANT
TUBING CIL FLEX 10 FLL-RA (TUBING) ×3 IMPLANT
WIRE ASAHI FIELDER XT 300CM (WIRE) ×6 IMPLANT
WIRE COUGAR XT STRL 190CM (WIRE) ×2 IMPLANT
WIRE HI TORQ WHISPER MS 190CM (WIRE) ×3 IMPLANT
WIRE SAFE-T 1.5MM-J .035X260CM (WIRE) ×3 IMPLANT

## 2015-04-08 NOTE — H&P (View-Only) (Signed)
Patient Care Team: Midge Minium, MD as PCP - General (Family Medicine) Deboraha Sprang, MD as Consulting Physician (Cardiology)   HPI  Jeremy Walker is a 64 y.o. male Followup for a reported cardiac arrest that happened recurrently many years ago. On both occasions he were associated with profound hypokalemia the cause of which was never elucidated. He is status post ICD implantation and prior generator replacement.he underwent generator replacement again June 2012    He has stopped smoking [facility]!!!!!!!   He has complaints of dyspnea on exertion. This is unassociated with chest pain. He has not had any edema. He is aware that he has put on weight since he stopped smoking.       Past Medical History      Past Medical History  Diagnosis Date  . Sudden cardiac death [facility])     aborted  . Automatic implantable cardiac defibrillator     Abdominal implant was 0064 Guidant lead 6/5 mm  . Hypokalemia     Recurrent associated with cardiac arrest  . Tobacco abuse     02-16-14 past hx. heavy smoker, quit < 65yr.  Marland Kitchen Anxiety   . Asthma   . Depression   . HLD (hyperlipidemia)   . Thyroid disease   . Atrial fibrillation [facility])     Past Surgical History  Procedure Laterality Date  . Cardiac defibrillator placement  1991    guidant  . Hand surgery Left 1965  . Eus N/A 02/26/2014    Procedure: ESOPHAGEAL ENDOSCOPIC ULTRASOUND (EUS) RADIAL;  Surgeon: Milus Banister, MD;  Location: [facility] ENDOSCOPY;  Service: Endoscopy;  Laterality: N/A;  radial linear    Current Outpatient Prescriptions  Medication Sig Dispense Refill  . albuterol (PROVENTIL HFA;VENTOLIN HFA) 108 (90 BASE) MCG/ACT inhaler Inhale 1-2 puffs into the lungs every 6 (six) hours as needed for wheezing or shortness of breath.    Marland Kitchen aluminum hydroxide-magnesium carbonate (GAVISCON) 95-358 MG/15ML SUSP Take 30 mLs by mouth 4 (four) times daily - after meals and at bedtime. (Patient taking differently: Take 30 mLs by  mouth as needed. ) 355 mL 3  . levothyroxine (SYNTHROID, LEVOTHROID) 50 MCG tablet Take 50 mcg by mouth daily.    . pantoprazole (PROTONIX) 40 MG tablet Take 1 tablet by mouth 2 (two) times daily.     . simvastatin (ZOCOR) 20 MG tablet Take 20 mg by mouth daily.    Marland Kitchen aspirin EC 81 MG tablet Take 1 tablet (81 mg total) by mouth daily. 90 tablet 3  . atenolol (TENORMIN) 50 MG tablet TAKE ONE TABLET BY MOUTH ONCE DAILY 90 tablet 0   No current facility-administered medications for this visit.    No Known Allergies  Review of Systems negative except from HPI and PMH  Physical Exam BP 110/78 mmHg  Pulse 56  Ht 6' (1.829 m)  Wt 201 lb 3.2 oz (91.264 kg)  BMI 27.28 kg/m2  SpO2 96% Well developed and well nourished in no acute distress HENT normal E scleral and icterus clear Neck Supple JVP flat; carotids brisk and full Clear to ausculation  Regular rate and rhythm, no murmurs gallops or rub Soft with active bowel sounds No clubbing cyanosis none Edema Alert and oriented, grossly normal motor and sensory function Skin Warm and Dry  ECG NSR  18/09/43  Assessment and  Plan  Aborted ardiac arrest  No intercurrent Ventricular tachycardia  Implantable defibrillator  The patient's device was interrogated.  The information was  reviewed. No changes were made in the programming.    Dyspnea on exertion  The patient has worsening dyspnea. This may be related to his weight gain. He is no longer smoking.  Chest x-ray from 2012 demonstrated hyperinflation. There has been no interval imaging  We will undertake a Myoview scan looking for both LV function and ischemia given his long-standing smoking and dyslipidemia

## 2015-04-08 NOTE — Progress Notes (Signed)
Echocardiogram 2D Echocardiogram has been performed.  Jeremy Walker 04/08/2015, 1:38 PM

## 2015-04-08 NOTE — Interval H&P Note (Signed)
History and Physical Interval Note:  04/08/2015 7:26 AM  Jeremy Walker  has presented today for cardiac cath with the diagnosis of abnormal stress test  The various methods of treatment have been discussed with the patient and family. After consideration of risks, benefits and other options for treatment, the patient has consented to  Procedure(s): Left Heart Cath and Coronary Angiography (N/A) as a surgical intervention .  The patient's history has been reviewed, patient examined, no change in status, stable for surgery.  I have reviewed the patient's chart and labs.  Questions were answered to the patient's satisfaction.   Cath Lab Visit (complete for each Cath Lab visit)  Clinical Evaluation Leading to the Procedure:   ACS: No.  Non-ACS:    Anginal Classification: CCS II  Anti-ischemic medical therapy: Minimal Therapy (1 class of medications)  Non-Invasive Test Results: Intermediate-risk stress test findings: cardiac mortality 1-3%/year  Prior CABG: No previous CABG          MCALHANY,CHRISTOPHER

## 2015-04-09 ENCOUNTER — Encounter (HOSPITAL_COMMUNITY): Payer: Self-pay | Admitting: Student

## 2015-04-09 DIAGNOSIS — Z955 Presence of coronary angioplasty implant and graft: Secondary | ICD-10-CM

## 2015-04-09 DIAGNOSIS — E785 Hyperlipidemia, unspecified: Secondary | ICD-10-CM | POA: Diagnosis not present

## 2015-04-09 DIAGNOSIS — Z95810 Presence of automatic (implantable) cardiac defibrillator: Secondary | ICD-10-CM

## 2015-04-09 DIAGNOSIS — I10 Essential (primary) hypertension: Secondary | ICD-10-CM | POA: Diagnosis not present

## 2015-04-09 DIAGNOSIS — I200 Unstable angina: Secondary | ICD-10-CM

## 2015-04-09 DIAGNOSIS — I2582 Chronic total occlusion of coronary artery: Secondary | ICD-10-CM | POA: Diagnosis not present

## 2015-04-09 DIAGNOSIS — I25110 Atherosclerotic heart disease of native coronary artery with unstable angina pectoris: Secondary | ICD-10-CM | POA: Diagnosis not present

## 2015-04-09 LAB — CBC
HEMATOCRIT: 41.9 % (ref 39.0–52.0)
Hemoglobin: 14 g/dL (ref 13.0–17.0)
MCH: 32 pg (ref 26.0–34.0)
MCHC: 33.4 g/dL (ref 30.0–36.0)
MCV: 95.9 fL (ref 78.0–100.0)
Platelets: 112 10*3/uL — ABNORMAL LOW (ref 150–400)
RBC: 4.37 MIL/uL (ref 4.22–5.81)
RDW: 12.5 % (ref 11.5–15.5)
WBC: 6.4 10*3/uL (ref 4.0–10.5)

## 2015-04-09 LAB — BASIC METABOLIC PANEL
Anion gap: 11 (ref 5–15)
BUN: 11 mg/dL (ref 6–20)
CHLORIDE: 104 mmol/L (ref 101–111)
CO2: 27 mmol/L (ref 22–32)
Calcium: 8.8 mg/dL — ABNORMAL LOW (ref 8.9–10.3)
Creatinine, Ser: 1.02 mg/dL (ref 0.61–1.24)
GFR calc Af Amer: >60 mL/min (ref >60)
GFR calc non Af Amer: >60 mL/min (ref >60)
GLUCOSE: 108 mg/dL — AB (ref 65–99)
POTASSIUM: 3.6 mmol/L (ref 3.5–5.1)
Sodium: 142 mmol/L (ref 135–145)

## 2015-04-09 MED ORDER — CLOPIDOGREL BISULFATE 75 MG PO TABS: 75.0000 mg | ORAL_TABLET | Freq: Every day | ORAL | Status: DC

## 2015-04-09 MED ORDER — LISINOPRIL 2.5 MG PO TABS: 2.5000 mg | ORAL_TABLET | Freq: Every day | ORAL | Status: DC

## 2015-04-09 NOTE — Progress Notes (Signed)
CARDIAC REHAB PHASE I   PRE:  Rate/Rhythm: 78 SR    BP: sitting 130/74    SaO2:   MODE:  Ambulation: 650 ft   POST:  Rate/Rhythm: 85 SR    BP: sitting 131/83     SaO2:   Tolerated well, no c/o. Sts he is not DOE but is anxious to walk the hill at his house to see if he is SOB now with stent. Ed completed with pt and wife. Good reception. Understand Plavix/ASA. Interested in [facility] and will send referral to [facility]   Josephina Shih [place] CES, ACSM 04/09/2015 8:59 AM

## 2015-04-09 NOTE — Discharge Summary (Signed)
CARDIOLOGY DISCHARGE SUMMARY   Patient ID: Jeremy Walker MRN: [id number] DOB/AGE: 1951/02/27 64 y.o.  Admit date: 04/08/2015 Discharge date: 04/09/2015  PCP: Garnet Koyanagi, DO Primary Cardiologist: Dr. Caryl Comes  Primary Discharge Diagnosis: Unstable Angina Secondary Discharge Diagnosis: Implantable defibrillator-[employer], Hyperlipidemia   Consults: None Procedures: Left Heart Catheterization, Coronary Angiography, Transthoracic Echocardiogram  Hospital Course: Jeremy Walker is a 64 y.o. male with past medical history of cardiac arrest (s/p AICD 1994, generator replacement 2012), chronic venous insufficiency, and HLD who had an abnormal stress test on 03/26/2015 in the setting or worsening dyspnea over the past few months. His stress test showed ischemia in the inferior/inferolateral wall and a cardiac catheterization was recommended. The risks and benefits of the procedure were explained in detail and he agreed to proceed.  He presented to [facility] on 04[facility]08/2015 for the procedure. His catheterization showed 100% stenosis in the mid to distal RCA. PCI was performed with placement of two Promus Premier DES. The full report is included below. There was concern for a possible thrombus in the LV Apex so an echocardiogram was obtained. His echo showed no evidence of thrombus but did show an EF of 40-45%. He has a normal EF noted on cath and normal EF noted on recent stress test, with no previous echo results on file to compare to. Could consider a repeat echo in several months to reassess LV function.  The following morning, he denied any chest pain or shortness of breath overnight. Vitals and lab results were reviewed and appeared stable. His right radial cath site was without ecchymosis or evidence of a hematoma. He ambulated over 650 ft with cardiac rehab without any anginal symptoms.   The patient was last examined by Dr. Irish Lack and deemed stable for discharge. He has Cardiology  follow-up on 04/23/2015. He will need to be on ASA and Plavix for 1 year. Was on a BB prior to admission and this was continued. Started on a low-dose ACE-I prior to discharge.  Labs:   Lab Results  Component Value Date   WBC 6.4 04/09/2015   HGB 14.0 04/09/2015   HCT 41.9 04/09/2015   MCV 95.9 04/09/2015   PLT 112* 04/09/2015     Recent Labs Lab 04/09/15 0530  NA 142  K 3.6  CL 104  CO2 27  BUN 11  CREATININE 1.02  CALCIUM 8.8*  GLUCOSE 108*   No results for input(s): CKTOTAL, CKMB, CKMBINDEX, TROPONINI in the last 72 hours. Lipid Panel     Component Value Date/Time   CHOL 136 10/13/2014 1006   TRIG 101.0 10/13/2014 1006   HDL 43.10 10/13/2014 1006   CHOLHDL 3 10/13/2014 1006   VLDL 20.2 10/13/2014 1006   LDLCALC 73 10/13/2014 1006    Cardiac Catheterization: 04/08/2015  Mid RCA to Dist RCA lesion, 100% stenosed. Post intervention, there is a 0% residual stenosis. The lesion was not previously treated.  The left ventricular systolic function is normal.  Left Ventricle The left ventricular size is normal. The left ventricular systolic function is normal. The left ventricular ejection fraction is greater tha 65% by visual estimate. There are no wall motion abnormalities in the left ventricle. Possible thrombus apex vs prominent trabeculation.  1. Severe single vessel CAD 2. CTO mid RCA with bridging collaterals.  3. Successful PCI with placement DES x 2 in the mid and distal RCA 4. Normal LV function with possible thrombus LV apex.   Recommendations: ASA and Plavix for one year. Echo  today with Definity contrast to exclude LV apical thrombus.    ECG: Sinus bradycardia. HR 56. No acute abnormalities.  ECHO: 04/08/2015 Study Conclusions - Left ventricle: The cavity size was normal. There was mild concentric hypertrophy. Systolic function was mildly to moderately reduced. The estimated ejection fraction was in the range of 40% to 45%. Wall motion was  normal; there were no regional wall motion abnormalities. Doppler parameters are consistent with abnormal left ventricular relaxation (grade 1 diastolic dysfunction). - Mitral valve: There was mild to moderate regurgitation. - Left atrium: The atrium was mildly to moderately dilated.  FOLLOW UP PLANS AND APPOINTMENTS No Known Allergies   Medication List    TAKE these medications        albuterol 108 (90 Base) MCG/ACT inhaler  Commonly known as:  PROVENTIL HFA;VENTOLIN HFA  Inhale 1-2 puffs into the lungs every 6 (six) hours as needed for wheezing or shortness of breath.     aluminum hydroxide-magnesium carbonate 95-358 MG/15ML Susp  Commonly known as:  GAVISCON  Take 30 mLs by mouth 4 (four) times daily - after meals and at bedtime.     aspirin EC 81 MG tablet  Take 1 tablet (81 mg total) by mouth daily.     atenolol 50 MG tablet  Commonly known as:  TENORMIN  TAKE ONE TABLET BY MOUTH ONCE DAILY     clopidogrel 75 MG tablet  Commonly known as:  PLAVIX  Take 1 tablet (75 mg total) by mouth daily with breakfast.     levothyroxine 50 MCG tablet  Commonly known as:  SYNTHROID, LEVOTHROID  Take 50 mcg by mouth daily.     lisinopril 2.5 MG tablet  Commonly known as:  PRINIVIL,ZESTRIL  Take 1 tablet (2.5 mg total) by mouth daily.     pantoprazole 40 MG tablet  Commonly known as:  PROTONIX  TAKE 1 TABLET BY MOUTH TWICE DAILY     simvastatin 20 MG tablet  Commonly known as:  ZOCOR  Take 20 mg by mouth daily.        Discharge Instructions    Amb Referral to Cardiac Rehabilitation    Complete by:  As directed   Diagnosis:  PCI          Follow-up Information    Follow up with Richardson Dopp, PA-C On 04/23/2015.   Specialties:  Physician Assistant, Radiology, Interventional Cardiology   Why:  Cardiology Hospital Follow-Up on 04/22/2014 at 9:50AM.   Contact information:   [id number] [place] [address] [phone]       BRING ALL  MEDICATIONS WITH YOU TO FOLLOW UP APPOINTMENTS  Time spent with patient to include physician time: 40 minutes  Signed: Erma Heritage, PA 04/09/2015, 9:44 AM Co-Sign MD   I have examined the patient and reviewed assessment and plan and discussed with patient. Agree with above as stated. He has been walking. No angina. I stressed the importance of dual antiplatelet therapy. He follows up for his defibrillator with Dr. Caryl Comes. He has stop smoking in the recent past. Continue to abstain. Plan to discharge.  AICD for aborted sudden cardiac death. Cardiac rehab. Right radial site with palpable pulse and no hematoma.  Jeremy Walker S.

## 2015-04-09 NOTE — Discharge Instructions (Signed)

## 2015-04-09 NOTE — Progress Notes (Signed)
Patient Name: Jeremy Walker Date of Encounter: 04/09/2015  Active Problems:   Unstable angina [facility])   Primary Cardiologist: Dr. Caryl Comes Patient Profile: 64 yo male w/ PMH of cardiac arrest (s/p AICD 1994, generator replacement 2012), chronic venous insufficiency, and HLD who had abnormal stress test on 03/26/2015 in the setting or worsening dyspnea over the past few months. LHC on 04/08/2015 showed 100% stenosis in the mid to distal RCA with successful DESx2 placement.  SUBJECTIVE: Denies any chest pain or shortness of breath overnight. No complications following his cath.  OBJECTIVE Filed Vitals:   04/08/15 1925 04/08/15 1941 04/08/15 2000 04/09/15 0402  BP: 143/77 165/86  145/94  Pulse: 77   62  Temp: 97.7 F (36.5 C)   97.1 F (36.2 C)  TempSrc: Oral   Oral  Resp: 20 22 18 14   Height:      Weight:    207 lb 0.2 oz (93.9 kg)  SpO2: 94%   95%    Intake/Output Summary (Last 24 hours) at 04/09/15 0735 Last data filed at 04/09/15 [id number]  Gross per 24 hour  Intake    810 ml  Output   1450 ml  Net   -640 ml   Filed Weights   04/08/15 0546 04/09/15 0402  Weight: 198 lb (89.812 kg) 207 lb 0.2 oz (93.9 kg)    PHYSICAL EXAM General: Well developed, well nourished, male in no acute distress. Head: Normocephalic, atraumatic.  Neck: Supple without bruits, JVD not elevated. Lungs:  Resp regular and unlabored, CTA without wheezing or rales. Heart: RRR, S1, S2, no S3, S4, or murmur; no rub. Abdomen: Soft, non-tender, non-distended with normoactive bowel sounds. No hepatomegaly. No rebound/guarding. No obvious abdominal masses. Extremities: No clubbing, cyanosis, or edema. Distal pedal pulses are 2+ bilaterally. Right radial cath site without ecchymosis or evidence of a hematoma. Neuro: Alert and oriented X 3. Moves all extremities spontaneously. Psych: Normal affect.   LABS: CBC: Recent Labs  04/06/15 0851 04/09/15 0530  WBC 4.9 6.4  NEUTROABS 2.5  --   HGB 15.1 14.0    HCT 44.6 41.9  MCV 95.5 95.9  PLT 119* 112*   INR: Recent Labs  04/06/15 0851  INR [id number]   Basic Metabolic Panel: Recent Labs  04/06/15 0851 04/09/15 0530  NA 141 142  K 4.5 3.6  CL 104 104  CO2 28 27  GLUCOSE 90 108*  BUN 15 11  CREATININE 1.14 1.02  CALCIUM 9.5 8.8*    TELE:  NSR with rate in mid-50's - 70's. Frequent PVC's.      ECG: Sinus bradycardia. HR 56. No acute abnormalities.  ECHO: 04/08/2015 Study Conclusions - Left ventricle: The cavity size was normal. There was mild concentric hypertrophy. Systolic function was mildly to moderately reduced. The estimated ejection fraction was in the range of 40% to 45%. Wall motion was normal; there were no regional wall motion abnormalities. Doppler parameters are consistent with abnormal left ventricular relaxation (grade 1 diastolic dysfunction). - Mitral valve: There was mild to moderate regurgitation. - Left atrium: The atrium was mildly to moderately dilated.  Cardiac Catheterization:  Mid RCA to Dist RCA lesion, 100% stenosed. Post intervention, there is a 0% residual stenosis. The lesion was not previously treated.  The left ventricular systolic function is normal.  Left Ventricle The left ventricular size is normal. The left ventricular systolic function is normal. The left ventricular ejection fraction is greater tha 65% by visual estimate. There are no wall motion  abnormalities in the left ventricle. Possible thrombus apex vs prominent trabeculation.  1. Severe single vessel CAD 2. CTO mid RCA with bridging collaterals.  3. Successful PCI with placement DES x 2 in the mid and distal RCA 4. Normal LV function with possible thrombus LV apex.   Recommendations: ASA and Plavix for one year. Echo today with Definity contrast to exclude LV apical thrombus.   Current Medications:  . aspirin EC  81 mg Oral Daily  . atenolol  50 mg Oral Daily  . clopidogrel  75 mg Oral Q breakfast  .  levothyroxine  50 mcg Oral QAC breakfast  . pantoprazole  40 mg Oral BID  . simvastatin  20 mg Oral q1800  . sodium chloride  3 mL Intravenous Q12H      ASSESSMENT AND PLAN:  1. Unstable Angina - presented to the office on 03/16/2015 with worsening dyspnea over the past few months - had abnormal stress test on 03/26/2015 which showed ischemia in the inferior/inferolateral wall. - LHC on 04/08/2015 showed 100% stenosis in the mid to distal RCA with successful DESx2 placement. - concern for possible thrombus in the LV apex during cath. Echo showed no evidence of thrombus. Echo did show reduced EF of 40-45% with normal EF noted on cath and normal EF noted on recent stress test. Has no previous echo results on file. Could consider repeat echo in several months. - continue ASA, statin, BB, and Plavix.  - BP has been elevated into the 160's this admission. Consider starting low-dose ACE-I.  2. HLD - continue statin  3. AICD placement - recently interrogated on 03/16/2015 and functioning normally  Signed, Erma Heritage , PA-C 7:35 AM 04/09/2015 Pager: [phone]   I have examined the patient and reviewed assessment and plan and discussed with patient.  Agree with above as stated.  He has been walking. No angina. I stressed the importance of dual antiplatelet therapy.  He follows up for his defibrillator with Dr. Caryl Comes. He has stop smoking in the recent past. Continue to abstain. Plan to discharge later today. Right radial site with palpable pulse and no hematoma.  Jadien Lehigh S.

## 2015-04-11 ENCOUNTER — Other Ambulatory Visit: Payer: Self-pay | Admitting: Family Medicine

## 2015-04-13 NOTE — Telephone Encounter (Signed)
Medication filled to pharmacy as requested.   

## 2015-04-19 ENCOUNTER — Encounter: Payer: 59 | Admitting: Family Medicine

## 2015-04-22 NOTE — Progress Notes (Signed)
Cardiology Office Note    Date:  04/23/2015   ID:  [name], DOB Dec 02, 1950, MRN [id number]  PCP:  Garnet Koyanagi, DO  Cardiologist:  Dr. Virl Axe   Electrophysiologist:  Dr. Virl Axe   Chief Complaint  Patient presents with  . Hospitalization Follow-up    s/p PCI  . Coronary Artery Disease    History of Present Illness:  Jeremy Walker is a 65 y.o. male with a hx of remote cardiac arrest in the setting of profound hypokalemia status post AICD, HL, prior tobacco abuse.  He recently complained of dyspnea. Nuclear stress test demonstrated inferior and inferolateral ischemia, normal LV function. Cardiac catheterization was arranged. This was performed 04/08/15 and demonstrated mid RCA occlusion which was treated with Promus DES 2. There was concern for possible thrombus in the LV apex on left ventriculogram. Echocardiogram was obtained and demonstrated EF 40-45%. There is no evidence of thrombus.  He returns for follow-up.  He is doing well. Breathing is better. Still gets out of breath if he leans over to tie his shoes. Denies chest pain, syncope, orthopnea, PND, edema.    Past Medical History  Diagnosis Date  . Sudden cardiac death ([place]) 1994    aborted  . AICD (automatic cardioverter/defibrillator) present     Abdominal implant was 0064 Guidant lead 6/5 mm  . Hypokalemia     Recurrent associated with cardiac arrest  . Tobacco abuse     02-16-14 past hx. heavy smoker, quit < 2yr.  Marland Kitchen Anxiety   . Depression   . HLD (hyperlipidemia)   . Thyroid disease   . Atrial fibrillation ([place])   . History of hiatal hernia   . Hypothyroidism   . Childhood asthma   . GERD (gastroesophageal reflux disease)   . CAD (coronary artery disease) 03/2015    a. DESx2 to mid and distal RCA    Past Surgical History  Procedure Laterality Date  . Cardiac defibrillator placement  1991    guidant  . Hand surgery Left 1965    "got it caught in a conveyor belt"  . Eus N/A 02/26/2014   Procedure: ESOPHAGEAL ENDOSCOPIC ULTRASOUND (EUS) RADIAL;  Surgeon: Milus Banister, MD;  Location: [facility];  Service: Endoscopy;  Laterality: N/A;  radial linear  . Cardiac catheterization N/A 04/08/2015    Procedure: Left Heart Cath and Coronary Angiography;  Surgeon: Burnell Blanks, MD;  Location: [facility];  Service: Cardiovascular;  Laterality: N/A;  . Cardiac catheterization N/A 04/08/2015    Procedure: Coronary Stent Intervention;  Surgeon: Burnell Blanks, MD;  Location: [facility];  Service: Cardiovascular;  Laterality: N/A;    Current Medications:  Outpatient Prescriptions Prior to Visit  Medication Sig Dispense Refill  . albuterol (PROVENTIL HFA;VENTOLIN HFA) 108 (90 BASE) MCG/ACT inhaler Inhale 1-2 puffs into the lungs every 6 (six) hours as needed for wheezing or shortness of breath.    Marland Kitchen aspirin EC 81 MG tablet Take 1 tablet (81 mg total) by mouth daily. 90 tablet 3  . atenolol (TENORMIN) 50 MG tablet TAKE ONE TABLET BY MOUTH ONCE DAILY 90 tablet 0  . clopidogrel (PLAVIX) 75 MG tablet Take 1 tablet (75 mg total) by mouth daily with breakfast. 30 tablet 6  . levothyroxine (SYNTHROID, LEVOTHROID) 50 MCG tablet Take 50 mcg by mouth daily.    Marland Kitchen lisinopril (PRINIVIL,ZESTRIL) 2.5 MG tablet Take 1 tablet (2.5 mg total) by mouth daily. 30 tablet 6  . pantoprazole (PROTONIX) 40  MG tablet TAKE 1 TABLET BY MOUTH TWICE DAILY 60 tablet 2  . simvastatin (ZOCOR) 20 MG tablet TAKE 1 TABLET(20 MG) BY MOUTH AT BEDTIME 90 tablet 0  . aluminum hydroxide-magnesium carbonate (GAVISCON) 95-358 MG/15ML SUSP Take 30 mLs by mouth 4 (four) times daily - after meals and at bedtime. (Patient taking differently: Take 30 mLs by mouth as needed for indigestion. ) 355 mL 3   No facility-administered medications prior to visit.      Allergies:   Review of patient's allergies indicates no known allergies.   Social History   Social History  . Marital Status: Married     Spouse Name: N/A  . Number of Children: 1  . Years of Education: N/A   Occupational History  . retired    Social History Main Topics  . Smoking status: Former Smoker -- 1.50 packs/day for 42 years    Types: Cigarettes    Quit date: 02/04/2013  . Smokeless tobacco: Never Used  . Alcohol Use: 10.5 oz/week    21 Standard drinks or equivalent per week     Comment: "stopped drinking in 01/2013"  . Drug Use: Yes    Special: Marijuana     Comment: "none since 1971"  . Sexual Activity: Not Currently   Other Topics Concern  . None   Social History Narrative     Family History:  The patient's family history includes Cancer in his father; Diabetes in his brother, maternal aunt, and mother; Heart failure in his mother; Lung cancer in his brother; Ovarian cancer in his mother; Parkinson's disease in his mother; Scoliosis in his mother.   ROS:   Please see the history of present illness.    Review of Systems  Hematologic/Lymphatic: Bruises/bleeds easily.  All other systems reviewed and are negative.   PHYSICAL EXAM:   VS:  BP 130/88 mmHg  Pulse 50  Ht 6' (1.829 m)  Wt 196 lb 12.8 oz (89.268 kg)  BMI 26.69 kg/m2   GEN: Well nourished, well developed, in no acute distress HEENT: normal Neck: no JVD, no masses Cardiac: Normal S1/S2, RRR; no murmurs;  right wrist without hematoma or mass   Respiratory:  clear to auscultation bilaterally; no wheezing, rhonchi or rales GI: soft, nontender, nondistended, + BS MS: no deformity or atrophy Skin: warm and dry, no rash Neuro:  Bilateral strength equal, no focal deficits  Psych: Alert and oriented x 3, normal affect  Wt Readings from Last 3 Encounters:  04/23/15 196 lb 12.8 oz (89.268 kg)  04/09/15 207 lb 0.2 oz (93.9 kg)  03/16/15 201 lb 3.2 oz (91.264 kg)      Studies/Labs Reviewed:   EKG:  EKG is ordered today.  The ekg ordered today demonstrates sinus bradycardia, HR 51, leftward axis, no ST changes, QTc 409 ms, no significant  change when compared to prior tracing  Recent Labs: 10/13/2014: ALT 23; Magnesium 2.0 11/20/2014: TSH 2.772 04/09/2015: BUN 11; Creatinine, Ser 1.02; Hemoglobin 14.0; Platelets 112*; Potassium 3.6; Sodium 142   Recent Lipid Panel    Component Value Date/Time   CHOL 136 10/13/2014 1006   TRIG 101.0 10/13/2014 1006   HDL 43.10 10/13/2014 1006   CHOLHDL 3 10/13/2014 1006   VLDL 20.2 10/13/2014 1006   LDLCALC 73 10/13/2014 1006    Additional studies/ records that were reviewed today include:   Echo 04/08/15 Mild LVH, EF 40-45%, no RWMA, Gr 1 DD, mild to mod MR, mild to mod LAE  LHC 04/08/15 LAD ok  LCx ok RCA mid 100% EF 65% PCI: 2.75 x 32 mm and 2.25 x 16 mm Promus Premier DES to RCA  Myoview 03/26/15 Myocardial perfusion is abnormal. Findings consistent with ischemia. Ischemia in the inferior/inferolateral wall (base, mid) This is an intermediate risk study. Overall left ventricular systolic function was normal. LV cavity size is normal. Nuclear stress EF: 57%.   ASSESSMENT:    1. Coronary artery disease involving native coronary artery of native heart without angina pectoris   2. Ischemic cardiomyopathy   3. Hyperlipidemia   4. ICD (implantable cardioverter-defibrillator), single, in situ     PLAN:  In order of problems listed above:  1. CAD - s/p recent PCI with DES times to the RCA.  Doing well. No angina. Continue ASA, Plavix, statin, beta-blocker, ACE inhibitor.  We discussed the importance of dual antiplatelet therapy. He is not interested in cardiac rehab. He will continue to walk on his own.   2. Ischemic cardiomyopathy - EF 40-45% by echocardiogram. Continue beta blocker, ACE inhibitor. Consider follow-up echo 90 days post PCI.   3. HL - Recent LDL 73.  With recent PCI, he should be on moderate intensity statin.  Will increase Simvastatin to 40 mg QHS.  Check Lipids and LFTs in 6 weeks.     4. S/p ICD - Hx of remote cardiac arrest in setting of profound  hypokalemia.  FU with EP as planned.     Medication Adjustments/Labs and Tests Ordered: Current medicines are reviewed at length with the patient today.  Concerns regarding medicines are outlined above.  Medication changes, Labs and Tests ordered today are outlined in the Patient Instructions noted below.   Signed, Richardson Dopp, PA-C  04/23/2015 2:52 PM    [facility] [address], [place], [place]  [address] Phone: [phone]; Fax: [phone]    Patient Instructions  Medication Instructions:  1. INCREASE SIMVASTATIN TO 40 MG AT BEDTIME  Labwork: 1. TODAY BMET  2. 6-8 WEEKS FASTING LIPID AND LIVER PANEL  Testing/Procedures: NONE  Follow-Up: 3 MONTHS WITH DR. Caryl Comes  Any Other Special Instructions Will Be Listed Below (If Applicable).   If you need a refill on your cardiac medications before your next appointment, please call your pharmacy.

## 2015-04-23 ENCOUNTER — Encounter: Payer: Self-pay | Admitting: Physician Assistant

## 2015-04-23 ENCOUNTER — Ambulatory Visit (INDEPENDENT_AMBULATORY_CARE_PROVIDER_SITE_OTHER): Payer: BLUE CROSS/BLUE SHIELD | Admitting: Physician Assistant

## 2015-04-23 VITALS — BP 130/88 | HR 50 | Ht 72.0 in | Wt 196.8 lb

## 2015-04-23 DIAGNOSIS — E785 Hyperlipidemia, unspecified: Secondary | ICD-10-CM

## 2015-04-23 DIAGNOSIS — Z95810 Presence of automatic (implantable) cardiac defibrillator: Secondary | ICD-10-CM

## 2015-04-23 DIAGNOSIS — I2510 Atherosclerotic heart disease of native coronary artery without angina pectoris: Secondary | ICD-10-CM

## 2015-04-23 DIAGNOSIS — I255 Ischemic cardiomyopathy: Secondary | ICD-10-CM

## 2015-04-23 LAB — BASIC METABOLIC PANEL
BUN: 21 mg/dL (ref 7–25)
CALCIUM: 9.2 mg/dL (ref 8.6–10.3)
CO2: 26 mmol/L (ref 20–31)
CREATININE: 1.58 mg/dL — AB (ref 0.70–1.25)
Chloride: 100 mmol/L (ref 98–110)
GLUCOSE: 97 mg/dL (ref 65–99)
Potassium: 4.8 mmol/L (ref 3.5–5.3)
SODIUM: 134 mmol/L — AB (ref 135–146)

## 2015-04-23 MED ORDER — SIMVASTATIN 40 MG PO TABS: 40.0000 mg | ORAL_TABLET | Freq: Every day | ORAL | Status: DC

## 2015-04-23 NOTE — Patient Instructions (Signed)
Medication Instructions:  1. INCREASE SIMVASTATIN TO 40 MG AT BEDTIME  Labwork: 1. TODAY BMET  2. 6-8 WEEKS FASTING LIPID AND LIVER PANEL  Testing/Procedures: NONE  Follow-Up: 3 MONTHS WITH DR. Caryl Comes  Any Other Special Instructions Will Be Listed Below (If Applicable).   If you need a refill on your cardiac medications before your next appointment, please call your pharmacy.

## 2015-04-26 ENCOUNTER — Telehealth (HOSPITAL_COMMUNITY): Payer: Self-pay

## 2015-04-26 DIAGNOSIS — Z79899 Other long term (current) drug therapy: Secondary | ICD-10-CM

## 2015-04-26 NOTE — Telephone Encounter (Signed)
Patient given lab results and instructions to stop lisinopril. Patient will come in next week for a repeat BMET. Patient verbalized understanding.  Notes Recorded by Liliane Shi, PA-C on 04/26/2015 at 1:46 PM Creatinine increased since starting on Lisinopril. Stop Lisinopril. Repeat BMET 1 week Richardson Dopp, PA-C

## 2015-05-04 ENCOUNTER — Other Ambulatory Visit (INDEPENDENT_AMBULATORY_CARE_PROVIDER_SITE_OTHER): Payer: BLUE CROSS/BLUE SHIELD | Admitting: *Deleted

## 2015-05-04 DIAGNOSIS — Z79899 Other long term (current) drug therapy: Secondary | ICD-10-CM

## 2015-05-04 LAB — BASIC METABOLIC PANEL
BUN: 16 mg/dL (ref 7–25)
CALCIUM: 9 mg/dL (ref 8.6–10.3)
CHLORIDE: 105 mmol/L (ref 98–110)
CO2: 20 mmol/L (ref 20–31)
CREATININE: 1 mg/dL (ref 0.70–1.25)
Glucose, Bld: 121 mg/dL — ABNORMAL HIGH (ref 65–99)
Potassium: 4.2 mmol/L (ref 3.5–5.3)
Sodium: 140 mmol/L (ref 135–146)

## 2015-05-05 ENCOUNTER — Telehealth: Payer: Self-pay | Admitting: *Deleted

## 2015-05-05 ENCOUNTER — Encounter: Payer: Self-pay | Admitting: Behavioral Health

## 2015-05-05 ENCOUNTER — Telehealth: Payer: Self-pay | Admitting: Behavioral Health

## 2015-05-05 DIAGNOSIS — I255 Ischemic cardiomyopathy: Secondary | ICD-10-CM

## 2015-05-05 MED ORDER — LISINOPRIL 2.5 MG PO TABS: 2.5000 mg | ORAL_TABLET | Freq: Every day | ORAL | Status: DC

## 2015-05-05 NOTE — Telephone Encounter (Signed)
Pt has been notified of lab results and is agreeable to restart Lisinopril 2.5 mg daily which he will restart 05/06/15. BMET 05/10/15. Pt asked for [place] to please let Dr. Etter Sjogren know that we put him back on the Lisinopril 2.5 mg.

## 2015-05-05 NOTE — Telephone Encounter (Signed)
Pre-Visit Call completed with patient and chart updated.   Pre-Visit Info documented in Specialty Comments under [employer].    

## 2015-05-06 ENCOUNTER — Ambulatory Visit (INDEPENDENT_AMBULATORY_CARE_PROVIDER_SITE_OTHER): Payer: BLUE CROSS/BLUE SHIELD | Admitting: Family Medicine

## 2015-05-06 ENCOUNTER — Encounter: Payer: Self-pay | Admitting: Family Medicine

## 2015-05-06 VITALS — BP 122/72 | HR 54 | Temp 98.0°F | Ht 72.0 in | Wt 196.6 lb

## 2015-05-06 DIAGNOSIS — E039 Hypothyroidism, unspecified: Secondary | ICD-10-CM

## 2015-05-06 DIAGNOSIS — Z0000 Encounter for general adult medical examination without abnormal findings: Secondary | ICD-10-CM

## 2015-05-06 DIAGNOSIS — Z1159 Encounter for screening for other viral diseases: Secondary | ICD-10-CM

## 2015-05-06 DIAGNOSIS — K219 Gastro-esophageal reflux disease without esophagitis: Secondary | ICD-10-CM

## 2015-05-06 DIAGNOSIS — Z23 Encounter for immunization: Secondary | ICD-10-CM

## 2015-05-06 DIAGNOSIS — Z114 Encounter for screening for human immunodeficiency virus [HIV]: Secondary | ICD-10-CM

## 2015-05-06 DIAGNOSIS — E785 Hyperlipidemia, unspecified: Secondary | ICD-10-CM | POA: Diagnosis not present

## 2015-05-06 LAB — POCT URINALYSIS DIPSTICK
Blood, UA: NEGATIVE
GLUCOSE UA: NEGATIVE
Leukocytes, UA: NEGATIVE
Nitrite, UA: NEGATIVE
UROBILINOGEN UA: 0.2
pH, UA: 6

## 2015-05-06 LAB — COMPREHENSIVE METABOLIC PANEL
ALT: 27 U/L (ref 0–53)
AST: 22 U/L (ref 0–37)
Albumin: 4.4 g/dL (ref 3.5–5.2)
Alkaline Phosphatase: 94 U/L (ref 39–117)
BUN: 15 mg/dL (ref 6–23)
CALCIUM: 9.2 mg/dL (ref 8.4–10.5)
CHLORIDE: 106 meq/L (ref 96–112)
CO2: 28 meq/L (ref 19–32)
Creatinine, Ser: 1.04 mg/dL (ref 0.40–1.50)
GFR: 76.45 mL/min (ref >60.00)
Glucose, Bld: 109 mg/dL — ABNORMAL HIGH (ref 70–99)
Potassium: 4.3 mEq/L (ref 3.5–5.1)
Sodium: 142 mEq/L (ref 135–145)
Total Bilirubin: 0.5 mg/dL (ref 0.2–1.2)
Total Protein: 6.9 g/dL (ref 6.0–8.3)

## 2015-05-06 LAB — CBC WITH DIFFERENTIAL/PLATELET
BASOS PCT: 0.5 % (ref 0.0–3.0)
Basophils Absolute: 0 10*3/uL (ref 0.0–0.1)
Eosinophils Absolute: 0.1 10*3/uL (ref 0.0–0.7)
Eosinophils Relative: 2.2 % (ref 0.0–5.0)
HEMATOCRIT: 45.4 % (ref 39.0–52.0)
Hemoglobin: 15.1 g/dL (ref 13.0–17.0)
LYMPHS PCT: 33.6 % (ref 12.0–46.0)
Lymphs Abs: 1.7 10*3/uL (ref 0.7–4.0)
MCHC: 33.3 g/dL (ref 30.0–36.0)
MCV: 95.4 fl (ref 78.0–100.0)
MONOS PCT: 6.6 % (ref 3.0–12.0)
Monocytes Absolute: 0.3 10*3/uL (ref 0.1–1.0)
NEUTROS ABS: 3 10*3/uL (ref 1.4–7.7)
Neutrophils Relative %: 57.1 % (ref 43.0–77.0)
PLATELETS: 129 10*3/uL — AB (ref 150.0–400.0)
RBC: 4.76 Mil/uL (ref 4.22–5.81)
RDW: 12.9 % (ref 11.5–15.5)
WBC: 5.2 10*3/uL (ref 4.0–10.5)

## 2015-05-06 LAB — PSA: PSA: 0.98 ng/mL (ref 0.10–4.00)

## 2015-05-06 LAB — LIPID PANEL
CHOL/HDL RATIO: 4
Cholesterol: 162 mg/dL (ref 0–200)
HDL: 44.3 mg/dL (ref >39.00)
LDL Cholesterol: 79 mg/dL (ref 0–99)
NONHDL: 118.13
Triglycerides: 196 mg/dL — ABNORMAL HIGH (ref 0.0–149.0)
VLDL: 39.2 mg/dL (ref 0.0–40.0)

## 2015-05-06 LAB — TSH: TSH: 2.8 u[IU]/mL (ref 0.35–4.50)

## 2015-05-06 NOTE — Patient Instructions (Signed)

## 2015-05-06 NOTE — Progress Notes (Signed)
Patient ID: Jeremy Walker, male    DOB: 15-Mar-1951  Age: 65 y.o. MRN: [id number]    Subjective:  Subjective HPI Jeremy Walker presents for cpe.  No complaints.  Pt has been seeing cardiology and recently had a stent, hx defibrilator. No new complaints.   Review of Systems  Constitutional: Negative.   HENT: Negative for congestion, ear pain, hearing loss, nosebleeds, postnasal drip, rhinorrhea, sinus pressure, sneezing and tinnitus.   Eyes: Negative for photophobia, discharge, itching and visual disturbance.  Respiratory: Negative.   Cardiovascular: Negative.   Gastrointestinal: Negative for abdominal pain, constipation, blood in stool, abdominal distention and anal bleeding.  Endocrine: Negative.   Genitourinary: Negative.   Musculoskeletal: Negative.   Skin: Negative.   Allergic/Immunologic: Negative.   Neurological: Negative for dizziness, weakness, light-headedness, numbness and headaches.  Psychiatric/Behavioral: Negative for suicidal ideas, confusion, sleep disturbance, dysphoric mood, decreased concentration and agitation. The patient is not nervous/anxious.     History Past Medical History  Diagnosis Date  . Sudden cardiac death ([place]) 1994    aborted  . AICD (automatic cardioverter/defibrillator) present     Abdominal implant was 0064 Guidant lead 6/5 mm  . Hypokalemia     Recurrent associated with cardiac arrest  . Tobacco abuse     02-16-14 past hx. heavy smoker, quit < 47yr  .Marland KitchenAnxiety   . Depression   . HLD (hyperlipidemia)   . Thyroid disease   . [place]   . History of hiatal hernia   . Hypothyroidism   . Childhood asthma   . GERD (gastroesophageal reflux disease)   . CAD (coronary artery disease) 03/2015    a. DESx2 to mid and distal RCA    He has past surgical history that includes Cardiac defibrillator placement (1991); Hand surgery (Left, 1965); EUS (N/A, 02/26/2014); Cardiac catheterization (N/A, 04/08/2015); and Cardiac catheterization  (N/A, 04/08/2015).   His family history includes Cancer in his father; Diabetes in his brother, maternal aunt, and mother; Heart failure in his mother; Lung cancer in his brother; Ovarian cancer in his mother; Parkinson's disease in his mother; Scoliosis in his mother.He reports that he quit smoking about 2 years ago. His smoking use included Cigarettes. He has a 63 pack-year smoking history. He has never used smokeless tobacco. He reports that he drinks about 10.5 oz of alcohol per week. He reports that he uses illicit drugs (Marijuana).  Current Outpatient Prescriptions on File Prior to Visit  Medication Sig Dispense Refill  . aluminum hydroxide-magnesium carbonate (GAVISCON) 95-358 MG/15ML SUSP Take 30 mLs by mouth 4 (four) times daily - after meals and at bedtime.    .Marland Kitchenaspirin EC 81 MG tablet Take 1 tablet (81 mg total) by mouth daily. 90 tablet 3  . atenolol (TENORMIN) 50 MG tablet TAKE ONE TABLET BY MOUTH ONCE DAILY 90 tablet 0  . clopidogrel (PLAVIX) 75 MG tablet Take 1 tablet (75 mg total) by mouth daily with breakfast. 30 tablet 6  . levothyroxine (SYNTHROID, LEVOTHROID) 50 MCG tablet Take 50 mcg by mouth daily.    .Marland Kitchenlisinopril (PRINIVIL,ZESTRIL) 2.5 MG tablet Take 1 tablet (2.5 mg total) by mouth daily.    . pantoprazole (PROTONIX) 40 MG tablet TAKE 1 TABLET BY MOUTH TWICE DAILY 60 tablet 2  . simvastatin (ZOCOR) 40 MG tablet Take 1 tablet (40 mg total) by mouth daily at 6 PM. 90 tablet 3   No current facility-administered medications on file prior to visit.     Objective:  Objective Physical Exam  Constitutional: He is oriented to person, place, and time. He appears well-developed and well-nourished. No distress.  HENT:  Head: Normocephalic and atraumatic.  Right Ear: External ear normal.  Left Ear: External ear normal.  Nose: Nose normal.  Mouth/Throat: Oropharynx is clear and moist. No oropharyngeal exudate.  Eyes: Conjunctivae and EOM are normal. Pupils are equal, round, and  reactive to light. Right eye exhibits no discharge. Left eye exhibits no discharge.  Neck: Normal range of motion. Neck supple. No JVD present. No thyromegaly present.  Cardiovascular: Normal rate, regular rhythm and intact distal pulses.  Exam reveals no gallop and no friction rub.   No murmur heard. Pulmonary/Chest: Effort normal and breath sounds normal. No respiratory distress. He has no wheezes. He has no rales. He exhibits no tenderness.  Abdominal: Soft. Bowel sounds are normal. He exhibits no distension and no mass. There is no tenderness. There is no rebound and no guarding.  Genitourinary: Rectum normal, prostate normal and penis normal. Guaiac negative stool.  Musculoskeletal: Normal range of motion. He exhibits no edema or tenderness.  Lymphadenopathy:    He has no cervical adenopathy.  Neurological: He is alert and oriented to person, place, and time. He displays normal reflexes. He exhibits normal muscle tone.  Skin: Skin is warm and dry. No rash noted. He is not diaphoretic. No erythema. No pallor.  Psychiatric: He has a normal mood and affect. His behavior is normal. Judgment and thought content normal.  Nursing note and vitals reviewed.  BP 122/72 mmHg  Pulse 54  Temp(Src) 98 F (36.7 C) (Oral)  Ht 6' (1.829 m)  Wt 196 lb 9.6 oz (89.177 kg)  BMI 26.66 kg/m2  SpO2 97% Wt Readings from Last 3 Encounters:  05/06/15 196 lb 9.6 oz (89.177 kg)  04/23/15 196 lb 12.8 oz (89.268 kg)  04/09/15 207 lb 0.2 oz (93.9 kg)     Lab Results  Component Value Date   WBC 5.2 05/06/2015   HGB 15.1 05/06/2015   HCT 45.4 05/06/2015   PLT 129.0* 05/06/2015   GLUCOSE 109* 05/06/2015   CHOL 162 05/06/2015   TRIG 196.0* 05/06/2015   HDL 44.30 05/06/2015   LDLCALC 79 05/06/2015   ALT 27 05/06/2015   AST 22 05/06/2015   NA 142 05/06/2015   K 4.3 05/06/2015   CL 106 05/06/2015   CREATININE 1.04 05/06/2015   BUN 15 05/06/2015   CO2 28 05/06/2015   TSH 2.80 05/06/2015   PSA 0.98  05/06/2015   INR 0.93 04/06/2015    No results found.   Assessment & Plan:  Plan I have discontinued Mr. Tarter's albuterol. I am also having him maintain his aspirin EC, atenolol, levothyroxine, pantoprazole, clopidogrel, aluminum hydroxide-magnesium carbonate, simvastatin, and lisinopril.  No orders of the defined types were placed in this encounter.    Problem List Items Addressed This Visit      Unprioritized   Hypothyroidism    Check labs con't synthroid      Relevant Orders   TSH (Completed)   POCT urinalysis dipstick (Completed)   Hyperlipidemia - Primary    con't simvastatin Check labs      Relevant Orders   Comp Met (CMET) (Completed)   Lipid panel (Completed)   GERD (gastroesophageal reflux disease)    Pantoprazole daily stable       Other Visit Diagnoses    Preventative health care        Relevant Orders    Comp Met (CMET) (Completed)  CBC with Differential/Platelet (Completed)    Lipid panel (Completed)    TSH (Completed)    POCT urinalysis dipstick (Completed)    PSA (Completed)    Need for hepatitis C screening test        Relevant Orders    Hepatitis C antibody (Completed)    Screening for HIV (human immunodeficiency virus)        Relevant Orders    HIV antibody (Completed)    Need for immunization against influenza        Relevant Orders    Flu Vaccine QUAD 36+ mos IM (Fluarix) (Completed)       Follow-up: Return in 6 months (on 11/03/2015).  Garnet Koyanagi, DO

## 2015-05-06 NOTE — Progress Notes (Signed)
Pre visit review using our clinic review tool, if applicable. No additional management support is needed unless otherwise documented below in the visit note. 

## 2015-05-07 LAB — HIV ANTIBODY (ROUTINE TESTING W REFLEX): HIV: NONREACTIVE

## 2015-05-07 LAB — HEPATITIS C ANTIBODY: HCV Ab: NEGATIVE

## 2015-05-07 NOTE — Assessment & Plan Note (Signed)
con't simvastatin Check labs  

## 2015-05-07 NOTE — Assessment & Plan Note (Signed)
Pantoprazole daily stable

## 2015-05-07 NOTE — Assessment & Plan Note (Signed)
Check labs con't synthroid 

## 2015-05-10 ENCOUNTER — Other Ambulatory Visit (INDEPENDENT_AMBULATORY_CARE_PROVIDER_SITE_OTHER): Payer: BLUE CROSS/BLUE SHIELD | Admitting: *Deleted

## 2015-05-10 DIAGNOSIS — I255 Ischemic cardiomyopathy: Secondary | ICD-10-CM

## 2015-05-10 LAB — BASIC METABOLIC PANEL
BUN: 18 mg/dL (ref 7–25)
CHLORIDE: 102 mmol/L (ref 98–110)
CO2: 28 mmol/L (ref 20–31)
CREATININE: 1.06 mg/dL (ref 0.70–1.25)
Calcium: 9.6 mg/dL (ref 8.6–10.3)
Glucose, Bld: 99 mg/dL (ref 65–99)
POTASSIUM: 4.8 mmol/L (ref 3.5–5.3)
Sodium: 139 mmol/L (ref 135–146)

## 2015-05-11 ENCOUNTER — Other Ambulatory Visit: Payer: Self-pay | Admitting: *Deleted

## 2015-05-11 DIAGNOSIS — I4901 Ventricular fibrillation: Secondary | ICD-10-CM

## 2015-05-13 ENCOUNTER — Other Ambulatory Visit: Payer: Self-pay | Admitting: Family Medicine

## 2015-05-18 ENCOUNTER — Other Ambulatory Visit (INDEPENDENT_AMBULATORY_CARE_PROVIDER_SITE_OTHER): Payer: BLUE CROSS/BLUE SHIELD | Admitting: *Deleted

## 2015-05-18 DIAGNOSIS — I4901 Ventricular fibrillation: Secondary | ICD-10-CM

## 2015-05-18 LAB — BASIC METABOLIC PANEL
BUN: 15 mg/dL (ref 7–25)
CALCIUM: 9.2 mg/dL (ref 8.6–10.3)
CO2: 27 mmol/L (ref 20–31)
Chloride: 104 mmol/L (ref 98–110)
Creat: 1.03 mg/dL (ref 0.70–1.25)
GLUCOSE: 91 mg/dL (ref 65–99)
POTASSIUM: 4.4 mmol/L (ref 3.5–5.3)
SODIUM: 139 mmol/L (ref 135–146)

## 2015-05-19 ENCOUNTER — Telehealth: Payer: Self-pay | Admitting: *Deleted

## 2015-05-19 NOTE — Telephone Encounter (Signed)
Ptcb is aware labs 2/20 cancelled in our office and will have PCP check Lipid and Liver in 3 months.

## 2015-05-19 NOTE — Telephone Encounter (Signed)
Lmptcb to advise cancel 2/20 FLP/LFT hre in our office and just have PCP check labs in 3 months for FLP/LFT.

## 2015-05-19 NOTE — Telephone Encounter (Signed)
05-19-15 pt rtn call at 147pm to confirm he got your message

## 2015-05-20 ENCOUNTER — Telehealth: Payer: Self-pay

## 2015-05-20 DIAGNOSIS — E785 Hyperlipidemia, unspecified: Secondary | ICD-10-CM

## 2015-05-20 DIAGNOSIS — D696 Thrombocytopenia, unspecified: Secondary | ICD-10-CM

## 2015-05-20 NOTE — Telephone Encounter (Signed)
-----   Message from Oneta Rack sent at 05/20/2015  9:03 AM EST ----- Regarding: lab orders  Contact: [phone] Patient scheduled lab only for cholesterol check for 08/05/15

## 2015-05-31 ENCOUNTER — Other Ambulatory Visit: Payer: BLUE CROSS/BLUE SHIELD

## 2015-06-09 ENCOUNTER — Other Ambulatory Visit: Payer: Self-pay | Admitting: Internal Medicine

## 2015-06-30 ENCOUNTER — Telehealth: Payer: Self-pay | Admitting: Family Medicine

## 2015-06-30 NOTE — Telephone Encounter (Signed)
Pharmacy: [id number] - [place], [place] - [address]   Reason for call: Pt needing refill on pantaprazole. He 20 on hand taking 2/day. Pt asking if he needs to go back to Dr. Hilarie Fredrickson. He would rather stay with PCP bc he can't afford the specialist fees. Please advise if we cannot fill for him.

## 2015-06-30 NOTE — Telephone Encounter (Signed)
Please advise if it ok to fill.     KP 

## 2015-06-30 NOTE — Telephone Encounter (Signed)
According to Dr Garth Schlatter note he should be taking protonix 1x a day 40 mg --- med list says bid  But after egd it was qd

## 2015-07-01 MED ORDER — PANTOPRAZOLE SODIUM 40 MG PO TBEC: 40.0000 mg | DELAYED_RELEASE_TABLET | Freq: Two times a day (BID) | ORAL | Status: DC

## 2015-07-01 NOTE — Telephone Encounter (Signed)
40 mg 1 daily sent.    KP

## 2015-07-04 ENCOUNTER — Other Ambulatory Visit: Payer: Self-pay | Admitting: Internal Medicine

## 2015-08-05 ENCOUNTER — Other Ambulatory Visit (INDEPENDENT_AMBULATORY_CARE_PROVIDER_SITE_OTHER): Payer: BLUE CROSS/BLUE SHIELD

## 2015-08-05 ENCOUNTER — Other Ambulatory Visit: Payer: BLUE CROSS/BLUE SHIELD

## 2015-08-05 DIAGNOSIS — E785 Hyperlipidemia, unspecified: Secondary | ICD-10-CM | POA: Diagnosis not present

## 2015-08-05 DIAGNOSIS — D696 Thrombocytopenia, unspecified: Secondary | ICD-10-CM

## 2015-08-05 LAB — COMPREHENSIVE METABOLIC PANEL
ALBUMIN: 4.3 g/dL (ref 3.5–5.2)
ALK PHOS: 83 U/L (ref 39–117)
ALT: 20 U/L (ref 0–53)
AST: 19 U/L (ref 0–37)
BILIRUBIN TOTAL: 0.7 mg/dL (ref 0.2–1.2)
BUN: 17 mg/dL (ref 6–23)
CO2: 32 mEq/L (ref 19–32)
Calcium: 9.6 mg/dL (ref 8.4–10.5)
Chloride: 104 mEq/L (ref 96–112)
Creatinine, Ser: 1.11 mg/dL (ref 0.40–1.50)
GFR: 70.86 mL/min (ref >60.00)
GLUCOSE: 107 mg/dL — AB (ref 70–99)
POTASSIUM: 4 meq/L (ref 3.5–5.1)
SODIUM: 142 meq/L (ref 135–145)
TOTAL PROTEIN: 7 g/dL (ref 6.0–8.3)

## 2015-08-05 LAB — CBC WITH DIFFERENTIAL/PLATELET
BASOS ABS: 0 10*3/uL (ref 0.0–0.1)
BASOS PCT: 0.4 % (ref 0.0–3.0)
EOS ABS: 0.1 10*3/uL (ref 0.0–0.7)
Eosinophils Relative: 1.2 % (ref 0.0–5.0)
HEMATOCRIT: 42.7 % (ref 39.0–52.0)
Hemoglobin: 14.2 g/dL (ref 13.0–17.0)
LYMPHS ABS: 1.5 10*3/uL (ref 0.7–4.0)
Lymphocytes Relative: 29 % (ref 12.0–46.0)
MCHC: 33.3 g/dL (ref 30.0–36.0)
MCV: 95.2 fl (ref 78.0–100.0)
MONO ABS: 0.4 10*3/uL (ref 0.1–1.0)
Monocytes Relative: 8.5 % (ref 3.0–12.0)
NEUTROS ABS: 3.2 10*3/uL (ref 1.4–7.7)
NEUTROS PCT: 60.9 % (ref 43.0–77.0)
PLATELETS: 134 10*3/uL — AB (ref 150.0–400.0)
RBC: 4.48 Mil/uL (ref 4.22–5.81)
RDW: 14 % (ref 11.5–15.5)
WBC: 5.2 10*3/uL (ref 4.0–10.5)

## 2015-08-05 LAB — LIPID PANEL
CHOLESTEROL: 127 mg/dL (ref 0–200)
HDL: 40.4 mg/dL (ref >39.00)
LDL Cholesterol: 73 mg/dL (ref 0–99)
NONHDL: 86.64
Total CHOL/HDL Ratio: 3
Triglycerides: 66 mg/dL (ref 0.0–149.0)
VLDL: 13.2 mg/dL (ref 0.0–40.0)

## 2015-09-07 ENCOUNTER — Emergency Department (HOSPITAL_COMMUNITY): Payer: BLUE CROSS/BLUE SHIELD

## 2015-09-07 ENCOUNTER — Emergency Department (HOSPITAL_COMMUNITY)
Admission: EM | Admit: 2015-09-07 | Discharge: 2015-09-07 | Disposition: A | Discharge status: Home / Self Care | Payer: BLUE CROSS/BLUE SHIELD | Attending: Emergency Medicine | Admitting: Emergency Medicine

## 2015-09-07 ENCOUNTER — Encounter (HOSPITAL_COMMUNITY): Payer: Self-pay | Admitting: Emergency Medicine

## 2015-09-07 DIAGNOSIS — Z95810 Presence of automatic (implantable) cardiac defibrillator: Secondary | ICD-10-CM | POA: Insufficient documentation

## 2015-09-07 DIAGNOSIS — I2510 Atherosclerotic heart disease of native coronary artery without angina pectoris: Secondary | ICD-10-CM | POA: Diagnosis not present

## 2015-09-07 DIAGNOSIS — Z79891 Long term (current) use of opiate analgesic: Secondary | ICD-10-CM | POA: Insufficient documentation

## 2015-09-07 DIAGNOSIS — Y929 Unspecified place or not applicable: Secondary | ICD-10-CM | POA: Insufficient documentation

## 2015-09-07 DIAGNOSIS — Z79899 Other long term (current) drug therapy: Secondary | ICD-10-CM | POA: Insufficient documentation

## 2015-09-07 DIAGNOSIS — R1012 Left upper quadrant pain: Secondary | ICD-10-CM | POA: Insufficient documentation

## 2015-09-07 DIAGNOSIS — S299XXA Unspecified injury of thorax, initial encounter: Secondary | ICD-10-CM | POA: Diagnosis present

## 2015-09-07 DIAGNOSIS — Y939 Activity, unspecified: Secondary | ICD-10-CM | POA: Diagnosis not present

## 2015-09-07 DIAGNOSIS — Z87891 Personal history of nicotine dependence: Secondary | ICD-10-CM | POA: Insufficient documentation

## 2015-09-07 DIAGNOSIS — W010XXA Fall on same level from slipping, tripping and stumbling without subsequent striking against object, initial encounter: Secondary | ICD-10-CM | POA: Diagnosis not present

## 2015-09-07 DIAGNOSIS — S2232XA Fracture of one rib, left side, initial encounter for closed fracture: Secondary | ICD-10-CM | POA: Diagnosis not present

## 2015-09-07 DIAGNOSIS — Y999 Unspecified external cause status: Secondary | ICD-10-CM | POA: Insufficient documentation

## 2015-09-07 DIAGNOSIS — Z7982 Long term (current) use of aspirin: Secondary | ICD-10-CM | POA: Insufficient documentation

## 2015-09-07 LAB — CBC WITH DIFFERENTIAL/PLATELET
BASOS PCT: 0 %
Basophils Absolute: 0 10*3/uL (ref 0.0–0.1)
Eosinophils Absolute: 0 10*3/uL (ref 0.0–0.7)
Eosinophils Relative: 0 %
HEMATOCRIT: 41.3 % (ref 39.0–52.0)
Hemoglobin: 13.9 g/dL (ref 13.0–17.0)
LYMPHS ABS: 1.5 10*3/uL (ref 0.7–4.0)
LYMPHS PCT: 26 %
MCH: 32.1 pg (ref 26.0–34.0)
MCHC: 33.7 g/dL (ref 30.0–36.0)
MCV: 95.4 fL (ref 78.0–100.0)
MONO ABS: 0.4 10*3/uL (ref 0.1–1.0)
MONOS PCT: 7 %
NEUTROS ABS: 3.9 10*3/uL (ref 1.7–7.7)
Neutrophils Relative %: 67 %
Platelets: 132 10*3/uL — ABNORMAL LOW (ref 150–400)
RBC: 4.33 MIL/uL (ref 4.22–5.81)
RDW: 13.2 % (ref 11.5–15.5)
WBC: 5.9 10*3/uL (ref 4.0–10.5)

## 2015-09-07 LAB — COMPREHENSIVE METABOLIC PANEL
ALK PHOS: 83 U/L (ref 38–126)
ALT: 22 U/L (ref 17–63)
ANION GAP: 6 (ref 5–15)
AST: 23 U/L (ref 15–41)
Albumin: 4.4 g/dL (ref 3.5–5.0)
BILIRUBIN TOTAL: 0.9 mg/dL (ref 0.3–1.2)
BUN: 25 mg/dL — ABNORMAL HIGH (ref 6–20)
CALCIUM: 9 mg/dL (ref 8.9–10.3)
CO2: 26 mmol/L (ref 22–32)
Chloride: 106 mmol/L (ref 101–111)
Creatinine, Ser: 0.95 mg/dL (ref 0.61–1.24)
Glucose, Bld: 115 mg/dL — ABNORMAL HIGH (ref 65–99)
POTASSIUM: 4.2 mmol/L (ref 3.5–5.1)
Sodium: 138 mmol/L (ref 135–145)
TOTAL PROTEIN: 7.1 g/dL (ref 6.5–8.1)

## 2015-09-07 LAB — PROTIME-INR
INR: 0.96 (ref 0.00–1.49)
PROTHROMBIN TIME: 13 s (ref 11.6–15.2)

## 2015-09-07 MED ORDER — IOPAMIDOL (ISOVUE-300) INJECTION 61%
100.0000 mL | Freq: Once | INTRAVENOUS | Status: AC | PRN
  Administered 2015-09-07: 100 mL via INTRAVENOUS

## 2015-09-07 MED ORDER — HYDROCODONE-ACETAMINOPHEN 5-325 MG PO TABS
1.0000 | ORAL_TABLET | Freq: Once | ORAL | Status: AC
  Administered 2015-09-07: 1 via ORAL
  Filled 2015-09-07: qty 1

## 2015-09-07 MED ORDER — HYDROCODONE-ACETAMINOPHEN 5-325 MG PO TABS: 1.0000 | ORAL_TABLET | ORAL | Status: DC | PRN

## 2015-09-07 MED ORDER — LIDOCAINE 5 % EX PTCH: 1.0000 | MEDICATED_PATCH | CUTANEOUS | Status: DC

## 2015-09-07 NOTE — ED Provider Notes (Signed)
CSN: [id number]     Arrival date & time 09/07/15  1529 History   First MD Initiated Contact with Patient 09/07/15 1604     Chief Complaint  Patient presents with  . Fall  . Back Pain    no contusion or deformity     (Consider location/radiation/quality/duration/timing/severity/associated sxs/prior Treatment) HPI Comments: 65 year old male with history AICD, atrial fibrillation, CAD presents following a fall. The patient reports that he was standing on a counter replacing a light pole today when he slipped and fell off the counter landing on his back. This occurred at about 9 AM this morning. He was still able to run a urine and take his wife to the dentist. He says intermittently he has had very severe left mid to lower back pain. Denies any change in sensation or strength. Denies any shortness of breath or chest pain. He denies hitting his head and denies any headache. She is on Plavix but on no anticoagulation.   Past Medical History  Diagnosis Date  . Sudden cardiac death ([place]) 1994    aborted  . AICD (automatic cardioverter/defibrillator) present     Abdominal implant was 0064 Guidant lead 6/5 mm  . Hypokalemia     Recurrent associated with cardiac arrest  . Tobacco abuse     02-16-14 past hx. heavy smoker, quit < 4yr.  Marland Kitchen Anxiety   . Depression   . HLD (hyperlipidemia)   . Thyroid disease   . Atrial fibrillation ([place])   . History of hiatal hernia   . Hypothyroidism   . Childhood asthma   . GERD (gastroesophageal reflux disease)   . CAD (coronary artery disease) 03/2015    a. DESx2 to mid and distal RCA   Past Surgical History  Procedure Laterality Date  . Cardiac defibrillator placement  1991    guidant  . Hand surgery Left 1965    "got it caught in a conveyor belt"  . Eus N/A 02/26/2014    Procedure: ESOPHAGEAL ENDOSCOPIC ULTRASOUND (EUS) RADIAL;  Surgeon: Milus Banister, MD;  Location: [facility];  Service: Endoscopy;  Laterality: N/A;  radial linear  . Cardiac  catheterization N/A 04/08/2015    Procedure: Left Heart Cath and Coronary Angiography;  Surgeon: Burnell Blanks, MD;  Location: [facility];  Service: Cardiovascular;  Laterality: N/A;  . Cardiac catheterization N/A 04/08/2015    Procedure: Coronary Stent Intervention;  Surgeon: Burnell Blanks, MD;  Location: [facility];  Service: Cardiovascular;  Laterality: N/A;   Family History  Problem Relation Age of Onset  . Lung cancer Brother     smoker  . Cancer Father     bladder or kidney, mets  . Parkinson's disease Mother   . Scoliosis Mother   . Ovarian cancer Mother   . Diabetes Mother   . Heart failure Mother     CHF  . Diabetes Brother   . Diabetes Maternal Aunt    Social History  Substance Use Topics  . Smoking status: Former Smoker -- 1.50 packs/day for 42 years    Types: Cigarettes    Quit date: 02/04/2013  . Smokeless tobacco: Never Used  . Alcohol Use: No     Comment: "stopped drinking in 01/2013"    Review of Systems  Constitutional: Negative for fever, chills and fatigue.  HENT: Negative for congestion, postnasal drip, rhinorrhea and sinus pressure.   Eyes: Negative for visual disturbance.  Respiratory: Negative for cough, chest tightness, shortness of breath and wheezing.  Cardiovascular: Negative for chest pain, palpitations and leg swelling.  Gastrointestinal: Negative for nausea, vomiting, abdominal pain and diarrhea.  Genitourinary: Negative for dysuria, urgency, frequency, hematuria and decreased urine volume.  Musculoskeletal: Positive for back pain (left mid to low back pain). Negative for myalgias, gait problem, neck pain and neck stiffness.  Skin: Negative for rash.  Neurological: Negative for dizziness, weakness and headaches.  Hematological: Bruises/bleeds easily (on plavix).      Allergies  Review of patient's allergies indicates no known allergies.  Home Medications   Prior to Admission medications   Medication  Sig Start Date End Date Taking? Authorizing Provider  aluminum hydroxide-magnesium carbonate (GAVISCON) 95-358 MG/15ML SUSP Take 30 mLs by mouth 3 (three) times daily as needed for indigestion or heartburn.    Yes Historical Provider, MD  aspirin EC 81 MG tablet Take 1 tablet (81 mg total) by mouth daily. 11/20/14  Yes Midge Minium, MD  atenolol (TENORMIN) 50 MG tablet TAKE ONE TABLET BY MOUTH ONCE DAILY 06/09/15  Yes Deboraha Sprang, MD  clopidogrel (PLAVIX) 75 MG tablet Take 1 tablet (75 mg total) by mouth daily with breakfast. 04/09/15  Yes Erma Heritage, PA  levothyroxine (SYNTHROID, LEVOTHROID) 50 MCG tablet TAKE 1 TABLET(50 MCG) BY MOUTH DAILY 05/14/15  Yes Yvonne R Lowne Chase, DO  lisinopril (PRINIVIL,ZESTRIL) 2.5 MG tablet Take 1 tablet (2.5 mg total) by mouth daily. 05/05/15  Yes Scott T Kathlen Mody, PA-C  pantoprazole (PROTONIX) 40 MG tablet Take 1 tablet (40 mg total) by mouth 2 (two) times daily. 07/01/15  Yes Yvonne R Lowne Chase, DO  simvastatin (ZOCOR) 40 MG tablet Take 1 tablet (40 mg total) by mouth daily at 6 PM. 04/23/15  Yes Liliane Shi, PA-C  HYDROcodone-acetaminophen (NORCO/VICODIN) 5-325 MG tablet Take 1-2 tablets by mouth every 4 (four) hours as needed for moderate pain or severe pain. 09/07/15   Harvel Quale, MD  lidocaine (LIDODERM) 5 % Place 1 patch onto the skin daily. Remove & Discard patch within 12 hours or as directed by MD 09/07/15   Harvel Quale, MD   BP 123/72 mmHg  Pulse 58  Temp(Src) 97.7 F (36.5 C) (Oral)  Resp 16  Wt 188 lb (85.276 kg)  SpO2 97% Physical Exam  Constitutional: He is oriented to person, place, and time. He appears well-developed and well-nourished. No distress.  HENT:  Head: Normocephalic and atraumatic.  Right Ear: External ear normal.  Left Ear: External ear normal.  Mouth/Throat: Oropharynx is clear and moist. No oropharyngeal exudate.  Eyes: EOM are normal. Pupils are equal, round, and reactive to light.  Neck: Normal range  of motion. Neck supple.  Cardiovascular: Normal rate, normal heart sounds and intact distal pulses.  An irregularly irregular rhythm present.  No murmur heard. Pulmonary/Chest: Effort normal. No respiratory distress. He has no wheezes. He has no rales.  Abdominal: Soft. He exhibits no distension. There is no tenderness.  Musculoskeletal: He exhibits no edema.       Thoracic back: Normal.       Lumbar back: He exhibits normal range of motion, no bony tenderness, no swelling, no edema and no deformity.       Back:  Neurological: He is alert and oriented to person, place, and time.  Skin: Skin is warm and dry. No rash noted. He is not diaphoretic.  Vitals reviewed.   ED Course  Procedures (including critical care time) Labs Review Labs Reviewed  CBC WITH DIFFERENTIAL/PLATELET - Abnormal; Notable for  the following:    Platelets 132 (*)    All other components within normal limits  COMPREHENSIVE METABOLIC PANEL - Abnormal; Notable for the following:    Glucose, Bld 115 (*)    BUN 25 (*)    All other components within normal limits  PROTIME-INR    Imaging Review Ct Abdomen Pelvis W Contrast  09/07/2015  CLINICAL DATA:  Fall from counter with left back pain. Initial encounter. EXAM: CT ABDOMEN AND PELVIS WITH CONTRAST TECHNIQUE: Multidetector CT imaging of the abdomen and pelvis was performed using the standard protocol following bolus administration of intravenous contrast. CONTRAST:  130mL ISOVUE-300 IOPAMIDOL (ISOVUE-300) INJECTION 61% COMPARISON:  None. FINDINGS: Lower chest and abdominal wall:  Subsegmental basilar atelectasis. Extensive atherosclerotic calcifications seen in the right coronary circulation. ICD/pacer lead into the right ventricle. Small sliding hiatal hernia. Hepatobiliary: No focal liver abnormality.No evidence of biliary obstruction or stone. Pancreas: Unremarkable. Spleen: Chronic granulomatous changes. Adrenals/Urinary Tract: Negative adrenals. No hydronephrosis or  stone. Unremarkable bladder. Reproductive:No pathologic findings. Stomach/Bowel:  No obstruction. No appendicitis. Vascular/Lymphatic: No acute vascular abnormality. Aortic and branch vessel atherosclerotic calcification. No mass or adenopathy. Peritoneal: No ascites or pneumoperitoneum. Musculoskeletal: Mildly displaced posterior left ninth rib fracture. Gluteal subcutaneous contusion on the left without pelvic ring fracture or diastasis. Chronic left L5 pars defect with trace anterolisthesis. Focal L1-2 degenerative disc narrowing. IMPRESSION: 1. Mildly displaced left ninth rib fracture. 2. Left gluteal contusion. 3. No evidence of intra-abdominal injury. Electronically Signed   By: Monte Fantasia M.D.   On: 09/07/2015 18:15   I have personally reviewed and evaluated these images and lab results as part of my medical decision-making.   EKG Interpretation None      MDM  Patient was seen and evaluated in stable condition. Imaging with left ninth rib fracture consistent with patient's pain. Patient was provided with an incentive spirometer. He was instructed on use. He was also provided with prescriptions for Norco and Lidoderm patches. He was instructed to follow up outpatient. He and his wife expressed understanding and agreement with plan of care. Final diagnoses:  Rib fracture, left, closed, initial encounter    1. Rib fracture    Harvel Quale, MD 09/07/15 [phone]

## 2015-09-07 NOTE — ED Notes (Signed)
Pt reports he fell at 0900 this am. Pain in L upper flank from where he fell. No bruising or obvious deformity noted. Pt denies hematuria or abd pain.

## 2015-09-07 NOTE — ED Notes (Signed)
Pt transported to CT ?

## 2015-09-07 NOTE — ED Notes (Addendum)
Per EMS- incident was 6 hours ago. Pt c/o l/flank pain. Pt is ambulatory, alert, and oriented.  Pt reported that he was standing on a counter and fell to floor. Denied striking head. Denied dizziness, denies LOC. Pt initially had no complaints, but flank pain increased while he was running errands. Pain became to severe to drive. R/forearm skin tear reported. Covered with bandage by pt. L/elbow skin tear noted. Bleeding controlled. Pt is on Plavix

## 2015-09-07 NOTE — ED Notes (Signed)
Pt stated that he was standing on a counter changing a lightbulb and lost his balance. This incident was at 9am. He stated that he felt alrigth until he was at the dentist with his wife and felt a a sharp pain in left lower back. The pain increased after he got home so he called 911. Pt has two bandaged areas on r.forearm and l/elbow. Bleeding controlled at present. Pt stated that he has changed the bandages four times since this am. Pt is currently AO x4. Ambulated to room. Wife at bedside

## 2015-09-07 NOTE — Discharge Instructions (Signed)
You were seen and evaluated today for a last sided pain after fall. This appears to be secondary to rib fracture. Pain medication prescribed. Also use the incentive spirometer to keep your lungs (to prevent pneumonia. Please follow-up with your primary care physician for reevaluation.  Rib Fracture A rib fracture is a break or crack in one of the bones of the ribs. The ribs are a group of long, curved bones that wrap around your chest and attach to your spine. They protect your lungs and other organs in the chest cavity. A broken or cracked rib is often painful, but most do not cause other problems. Most rib fractures heal on their own over time. However, rib fractures can be more serious if multiple ribs are broken or if broken ribs move out of place and push against other structures. CAUSES   A direct blow to the chest. For example, this could happen during contact sports, a car accident, or a fall against a hard object.  Repetitive movements with high force, such as pitching a baseball or having severe coughing spells. SYMPTOMS   Pain when you breathe in or cough.  Pain when someone presses on the injured area. DIAGNOSIS  Your caregiver will perform a physical exam. Various imaging tests may be ordered to confirm the diagnosis and to look for related injuries. These tests may include a chest X-ray, computed tomography (CT), magnetic resonance imaging (MRI), or a bone scan. TREATMENT  Rib fractures usually heal on their own in 1-3 months. The longer healing period is often associated with a continued cough or other aggravating activities. During the healing period, pain control is very important. Medication is usually given to control pain. Hospitalization or surgery may be needed for more severe injuries, such as those in which multiple ribs are broken or the ribs have moved out of place.  HOME CARE INSTRUCTIONS   Avoid strenuous activity and any activities or movements that cause pain. Be  careful during activities and avoid bumping the injured rib.  Gradually increase activity as directed by your caregiver.  Only take over-the-counter or prescription medications as directed by your caregiver. Do not take other medications without asking your caregiver first.  Apply ice to the injured area for the first 1-2 days after you have been treated or as directed by your caregiver. Applying ice helps to reduce inflammation and pain.  Put ice in a plastic bag.  Place a towel between your skin and the bag.   Leave the ice on for 15-20 minutes at a time, every 2 hours while you are awake.  Perform deep breathing as directed by your caregiver. This will help prevent pneumonia, which is a common complication of a broken rib. Your caregiver may instruct you to:  Take deep breaths several times a day.  Try to cough several times a day, holding a pillow against the injured area.  Use a device called an incentive spirometer to practice deep breathing several times a day.  Drink enough fluids to keep your urine clear or pale yellow. This will help you avoid constipation.   Do not wear a rib belt or binder. These restrict breathing, which can lead to pneumonia.  SEEK IMMEDIATE MEDICAL CARE IF:   You have a fever.   You have difficulty breathing or shortness of breath.   You develop a continual cough, or you cough up thick or bloody sputum.  You feel sick to your stomach (nausea), throw up (vomit), or have abdominal pain.  You have worsening pain not controlled with medications.  MAKE SURE YOU:  Understand these instructions.  Will watch your condition.  Will get help right away if you are not doing well or get worse.   This information is not intended to replace advice given to you by your health care provider. Make sure you discuss any questions you have with your health care provider.   Document Released: 03/27/2005 Document Revised: 11/27/2012 Document Reviewed:  05/29/2012 Elsevier Interactive Patient Education [employer].

## 2015-09-07 NOTE — ED Notes (Signed)
Bed: WA01 Expected date:  Expected time:  Means of arrival:  Comments: Pt from 60

## 2015-09-07 NOTE — ED Notes (Signed)
MD at bedside. 

## 2015-09-09 ENCOUNTER — Ambulatory Visit (INDEPENDENT_AMBULATORY_CARE_PROVIDER_SITE_OTHER): Payer: BLUE CROSS/BLUE SHIELD | Admitting: Family Medicine

## 2015-09-09 ENCOUNTER — Encounter: Payer: Self-pay | Admitting: Family Medicine

## 2015-09-09 VITALS — BP 114/70 | HR 53 | Temp 97.7°F | Ht 72.0 in | Wt 190.2 lb

## 2015-09-09 DIAGNOSIS — S40811A Abrasion of right upper arm, initial encounter: Secondary | ICD-10-CM

## 2015-09-09 DIAGNOSIS — S2231XA Fracture of one rib, right side, initial encounter for closed fracture: Secondary | ICD-10-CM | POA: Diagnosis not present

## 2015-09-09 NOTE — Patient Instructions (Signed)

## 2015-09-09 NOTE — Progress Notes (Signed)
Pre visit review using our clinic review tool, if applicable. No additional management support is needed unless otherwise documented below in the visit note. 

## 2015-09-09 NOTE — Progress Notes (Signed)
Patient ID: Jeremy Walker, male    DOB: 1950/11/27  Age: 65 y.o. MRN: [id number]    Subjective:  Subjective HPI Jeremy Walker presents for f/u er from fall off counter in kitchen.  Ct / pelvis--- fractured rib on Left Pt has an abrasion R arm --- that keeps bleeding -- his off his plavix   Review of Systems  Constitutional: Negative for diaphoresis, appetite change, fatigue and unexpected weight change.  Eyes: Negative for pain, redness and visual disturbance.  Respiratory: Negative for cough, chest tightness, shortness of breath and wheezing.   Cardiovascular: Negative for chest pain, palpitations and leg swelling.  Endocrine: Negative for cold intolerance, heat intolerance, polydipsia, polyphagia and polyuria.  Genitourinary: Negative for dysuria, frequency and difficulty urinating.  Musculoskeletal: Positive for back pain.  Skin: Positive for wound. Negative for rash.  Neurological: Negative for dizziness, light-headedness, numbness and headaches.    History Past Medical History  Diagnosis Date  . Sudden cardiac death ([place]) 1994    aborted  . AICD (automatic cardioverter/defibrillator) present     Abdominal implant was 0064 Guidant lead 6/5 mm  . Hypokalemia     Recurrent associated with cardiac arrest  . Tobacco abuse     02-16-14 past hx. heavy smoker, quit < 55yr.  Marland Kitchen Anxiety   . Depression   . HLD (hyperlipidemia)   . Thyroid disease   . Atrial fibrillation ([place])   . History of hiatal hernia   . Hypothyroidism   . Childhood asthma   . GERD (gastroesophageal reflux disease)   . CAD (coronary artery disease) 03/2015    a. DESx2 to mid and distal RCA    He has past surgical history that includes Cardiac defibrillator placement (1991); Hand surgery (Left, 1965); EUS (N/A, 02/26/2014); Cardiac catheterization (N/A, 04/08/2015); and Cardiac catheterization (N/A, 04/08/2015).   His family history includes Cancer in his father; Diabetes in his brother, maternal aunt, and  mother; Heart failure in his mother; Lung cancer in his brother; Ovarian cancer in his mother; Parkinson's disease in his mother; Scoliosis in his mother.He reports that he quit smoking about 2 years ago. His smoking use included Cigarettes. He has a 63 pack-year smoking history. He has never used smokeless tobacco. He reports that he does not drink alcohol or use illicit drugs.  Current Outpatient Prescriptions on File Prior to Visit  Medication Sig Dispense Refill  . aluminum hydroxide-magnesium carbonate (GAVISCON) 95-358 MG/15ML SUSP Take 30 mLs by mouth 3 (three) times daily as needed for indigestion or heartburn.     Marland Kitchen aspirin EC 81 MG tablet Take 1 tablet (81 mg total) by mouth daily. 90 tablet 3  . atenolol (TENORMIN) 50 MG tablet TAKE ONE TABLET BY MOUTH ONCE DAILY 90 tablet 3  . clopidogrel (PLAVIX) 75 MG tablet Take 1 tablet (75 mg total) by mouth daily with breakfast. 30 tablet 6  . HYDROcodone-acetaminophen (NORCO/VICODIN) 5-325 MG tablet Take 1-2 tablets by mouth every 4 (four) hours as needed for moderate pain or severe pain. 20 tablet 0  . levothyroxine (SYNTHROID, LEVOTHROID) 50 MCG tablet TAKE 1 TABLET(50 MCG) BY MOUTH DAILY 30 tablet 5  . lidocaine (LIDODERM) 5 % Place 1 patch onto the skin daily. Remove & Discard patch within 12 hours or as directed by MD 30 patch 0  . lisinopril (PRINIVIL,ZESTRIL) 2.5 MG tablet Take 1 tablet (2.5 mg total) by mouth daily.    . pantoprazole (PROTONIX) 40 MG tablet Take 1 tablet (40 mg total) by mouth 2 (two)  times daily. 30 tablet 5  . simvastatin (ZOCOR) 40 MG tablet Take 1 tablet (40 mg total) by mouth daily at 6 PM. 90 tablet 3   No current facility-administered medications on file prior to visit.     Objective:  Objective Physical Exam  Constitutional: He is oriented to person, place, and time. Vital signs are normal. He appears well-developed and well-nourished. He is sleeping.  HENT:  Head: Normocephalic and atraumatic.    Mouth/Throat: Oropharynx is clear and moist.  Eyes: EOM are normal. Pupils are equal, round, and reactive to light.  Neck: Normal range of motion. Neck supple. No thyromegaly present.  Cardiovascular: Normal rate and regular rhythm.   No murmur heard. Pulmonary/Chest: Effort normal and breath sounds normal. No respiratory distress. He has no wheezes. He has no rales. He exhibits no tenderness.  Musculoskeletal: He exhibits no edema or tenderness.  Neurological: He is alert and oriented to person, place, and time.  Skin: Skin is warm and dry.     Psychiatric: He has a normal mood and affect. His behavior is normal. Judgment and thought content normal.  Nursing note and vitals reviewed.  BP 114/70 mmHg  Pulse 53  Temp(Src) 97.7 F (36.5 C) (Oral)  Ht 6' (1.829 m)  Wt 190 lb 3.2 oz (86.274 kg)  BMI 25.79 kg/m2  SpO2 95% Wt Readings from Last 3 Encounters:  09/09/15 190 lb 3.2 oz (86.274 kg)  09/07/15 188 lb (85.276 kg)  05/06/15 196 lb 9.6 oz (89.177 kg)     Lab Results  Component Value Date   WBC 5.9 09/07/2015   HGB 13.9 09/07/2015   HCT 41.3 09/07/2015   PLT 132* 09/07/2015   GLUCOSE 115* 09/07/2015   CHOL 127 08/05/2015   TRIG 66.0 08/05/2015   HDL 40.40 08/05/2015   LDLCALC 73 08/05/2015   ALT 22 09/07/2015   AST 23 09/07/2015   NA 138 09/07/2015   K 4.2 09/07/2015   CL 106 09/07/2015   CREATININE 0.95 09/07/2015   BUN 25* 09/07/2015   CO2 26 09/07/2015   TSH 2.80 05/06/2015   PSA 0.98 05/06/2015   INR 0.96 09/07/2015    Ct Abdomen Pelvis W Contrast  09/07/2015  CLINICAL DATA:  Fall from counter with left back pain. Initial encounter. EXAM: CT ABDOMEN AND PELVIS WITH CONTRAST TECHNIQUE: Multidetector CT imaging of the abdomen and pelvis was performed using the standard protocol following bolus administration of intravenous contrast. CONTRAST:  162mL ISOVUE-300 IOPAMIDOL (ISOVUE-300) INJECTION 61% COMPARISON:  None. FINDINGS: Lower chest and abdominal wall:   Subsegmental basilar atelectasis. Extensive atherosclerotic calcifications seen in the right coronary circulation. ICD/pacer lead into the right ventricle. Small sliding hiatal hernia. Hepatobiliary: No focal liver abnormality.No evidence of biliary obstruction or stone. Pancreas: Unremarkable. Spleen: Chronic granulomatous changes. Adrenals/Urinary Tract: Negative adrenals. No hydronephrosis or stone. Unremarkable bladder. Reproductive:No pathologic findings. Stomach/Bowel:  No obstruction. No appendicitis. Vascular/Lymphatic: No acute vascular abnormality. Aortic and branch vessel atherosclerotic calcification. No mass or adenopathy. Peritoneal: No ascites or pneumoperitoneum. Musculoskeletal: Mildly displaced posterior left ninth rib fracture. Gluteal subcutaneous contusion on the left without pelvic ring fracture or diastasis. Chronic left L5 pars defect with trace anterolisthesis. Focal L1-2 degenerative disc narrowing. IMPRESSION: 1. Mildly displaced left ninth rib fracture. 2. Left gluteal contusion. 3. No evidence of intra-abdominal injury. Electronically Signed   By: Monte Fantasia M.D.   On: 09/07/2015 18:15     Assessment & Plan:  Plan I am having Mr. Fennel maintain his aspirin EC,  clopidogrel, aluminum hydroxide-magnesium carbonate, simvastatin, lisinopril, levothyroxine, atenolol, pantoprazole, HYDROcodone-acetaminophen, and lidocaine.  No orders of the defined types were placed in this encounter.    Problem List Items Addressed This Visit    None      Follow-up: Return if symptoms worsen or fail to improve.  Ann Held, DO

## 2015-09-10 ENCOUNTER — Telehealth: Payer: Self-pay | Admitting: Family Medicine

## 2015-09-10 MED ORDER — TRAMADOL HCL 50 MG PO TABS: 50.0000 mg | ORAL_TABLET | Freq: Three times a day (TID) | ORAL | Status: DC | PRN

## 2015-09-10 NOTE — Telephone Encounter (Signed)
Ok to dispense per er instructions

## 2015-09-10 NOTE — Telephone Encounter (Signed)
Patient aware Tramadol will be faxed to the pharmacy and he said he would pick it up tomorrow.     KP

## 2015-09-10 NOTE — Telephone Encounter (Signed)
Please advise      KP 

## 2015-09-10 NOTE — Telephone Encounter (Signed)
Relationship to patient: self Can be reached: [phone] Pharmacy: [id number] - [place], [place] [address] [place]  Reason for call: Pt went to ER for cracked rib and lidocaine patches were ordered by ER doc. He states pharmacy told him they need approval of PCP to dispense. Please call [facility] to allow dispensing.

## 2015-09-10 NOTE — Telephone Encounter (Signed)
Called and spoke with the pt and he stated that the pharmacy told him that he will need a referral for the Lidocaine patches.  Informed the pt that we don't do referrals on prescriptions, but what the pharmacy needs is a prior auth.  Pt said he did not know but he needs something because he is in a lot of pain.  Called the pharmacy and they are needing a prior auth on the medication.   Waiting for the prior auth form from the pharmacy to be faxed.  Pt is requesting something for pain until the Lidocaine patches are approved.  Please advise.//AB/CMA

## 2015-09-10 NOTE — Telephone Encounter (Signed)
Ultram 50 mg 1 po q6h prn #30

## 2015-09-12 DIAGNOSIS — S2239XA Fracture of one rib, unspecified side, initial encounter for closed fracture: Secondary | ICD-10-CM | POA: Insufficient documentation

## 2015-09-12 DIAGNOSIS — S40811A Abrasion of right upper arm, initial encounter: Secondary | ICD-10-CM | POA: Insufficient documentation

## 2015-09-12 NOTE — Assessment & Plan Note (Signed)
Improving Ed visit improved

## 2015-09-12 NOTE — Assessment & Plan Note (Signed)
Bandage applied F/u prn

## 2015-09-14 ENCOUNTER — Encounter: Payer: Self-pay | Admitting: Family Medicine

## 2015-09-14 ENCOUNTER — Ambulatory Visit (INDEPENDENT_AMBULATORY_CARE_PROVIDER_SITE_OTHER): Payer: BLUE CROSS/BLUE SHIELD | Admitting: Family Medicine

## 2015-09-14 VITALS — BP 116/72 | HR 57 | Temp 98.2°F | Ht 72.0 in | Wt 190.8 lb

## 2015-09-14 DIAGNOSIS — R079 Chest pain, unspecified: Secondary | ICD-10-CM

## 2015-09-14 DIAGNOSIS — E785 Hyperlipidemia, unspecified: Secondary | ICD-10-CM | POA: Diagnosis not present

## 2015-09-14 DIAGNOSIS — S2232XG Fracture of one rib, left side, subsequent encounter for fracture with delayed healing: Secondary | ICD-10-CM | POA: Diagnosis not present

## 2015-09-14 DIAGNOSIS — R0789 Other chest pain: Secondary | ICD-10-CM

## 2015-09-14 DIAGNOSIS — S40811D Abrasion of right upper arm, subsequent encounter: Secondary | ICD-10-CM

## 2015-09-14 DIAGNOSIS — R609 Edema, unspecified: Secondary | ICD-10-CM | POA: Diagnosis not present

## 2015-09-14 DIAGNOSIS — S2232XD Fracture of one rib, left side, subsequent encounter for fracture with routine healing: Secondary | ICD-10-CM

## 2015-09-14 DIAGNOSIS — I10 Essential (primary) hypertension: Secondary | ICD-10-CM

## 2015-09-14 MED ORDER — HYDROCHLOROTHIAZIDE 12.5 MG PO CAPS: 12.5000 mg | ORAL_CAPSULE | Freq: Every day | ORAL | Status: DC

## 2015-09-14 NOTE — Progress Notes (Signed)
Patient ID: Jeremy Walker, male    DOB: 11/02/1950  Age: 66 y.o. MRN: [id number]    Subjective:  Subjective HPI Jeremy Walker presents for c/o low ext edema and he is having cp --- hes not sure if it is the rib fracture or his heart.  No sob.    Review of Systems  Constitutional: Negative for diaphoresis, appetite change, fatigue and unexpected weight change.  Eyes: Negative for pain, redness and visual disturbance.  Respiratory: Negative for cough, chest tightness, shortness of breath and wheezing.   Cardiovascular: Positive for chest pain and leg swelling. Negative for palpitations.  Endocrine: Negative for cold intolerance, heat intolerance, polydipsia, polyphagia and polyuria.  Genitourinary: Negative for dysuria, frequency and difficulty urinating.  Skin: Positive for wound. Negative for rash.  Neurological: Negative for dizziness, light-headedness, numbness and headaches.    History Past Medical History  Diagnosis Date  . Sudden cardiac death ([employer]) 1994    aborted  . AICD (automatic cardioverter/defibrillator) present     Abdominal implant was 0064 Guidant lead 6/5 mm  . Hypokalemia     Recurrent associated with cardiac arrest  . Tobacco abuse     02-16-14 past hx. heavy smoker, quit < 40yr.  Marland Kitchen Anxiety   . Depression   . HLD (hyperlipidemia)   . Thyroid disease   . Atrial fibrillation ([place])   . History of hiatal hernia   . Hypothyroidism   . Childhood asthma   . GERD (gastroesophageal reflux disease)   . CAD (coronary artery disease) 03/2015    a. DESx2 to mid and distal RCA    He has past surgical history that includes Cardiac defibrillator placement (1991); Hand surgery (Left, 1965); EUS (N/A, 02/26/2014); Cardiac catheterization (N/A, 04/08/2015); and Cardiac catheterization (N/A, 04/08/2015).   His family history includes Cancer in his father; Diabetes in his brother, maternal aunt, and mother; Heart failure in his mother; Lung cancer in his brother; Ovarian cancer  in his mother; Parkinson's disease in his mother; Scoliosis in his mother.He reports that he quit smoking about 2 years ago. His smoking use included Cigarettes. He has a 63 pack-year smoking history. He has never used smokeless tobacco. He reports that he does not drink alcohol or use illicit drugs.  Current Outpatient Prescriptions on File Prior to Visit  Medication Sig Dispense Refill  . aluminum hydroxide-magnesium carbonate (GAVISCON) 95-358 MG/15ML SUSP Take 30 mLs by mouth 3 (three) times daily as needed for indigestion or heartburn.     Marland Kitchen aspirin EC 81 MG tablet Take 1 tablet (81 mg total) by mouth daily. 90 tablet 3  . atenolol (TENORMIN) 50 MG tablet TAKE ONE TABLET BY MOUTH ONCE DAILY 90 tablet 3  . clopidogrel (PLAVIX) 75 MG tablet Take 1 tablet (75 mg total) by mouth daily with breakfast. 30 tablet 6  . levothyroxine (SYNTHROID, LEVOTHROID) 50 MCG tablet TAKE 1 TABLET(50 MCG) BY MOUTH DAILY 30 tablet 5  . lidocaine (LIDODERM) 5 % Place 1 patch onto the skin daily. Remove & Discard patch within 12 hours or as directed by MD 30 patch 0  . lisinopril (PRINIVIL,ZESTRIL) 2.5 MG tablet Take 1 tablet (2.5 mg total) by mouth daily.    . pantoprazole (PROTONIX) 40 MG tablet Take 1 tablet (40 mg total) by mouth 2 (two) times daily. 30 tablet 5  . simvastatin (ZOCOR) 40 MG tablet Take 1 tablet (40 mg total) by mouth daily at 6 PM. 90 tablet 3  . HYDROcodone-acetaminophen (NORCO/VICODIN) 5-325 MG tablet Take 1-2  tablets by mouth every 4 (four) hours as needed for moderate pain or severe pain. (Patient not taking: Reported on 09/14/2015) 20 tablet 0  . traMADol (ULTRAM) 50 MG tablet Take 1 tablet (50 mg total) by mouth every 8 (eight) hours as needed. (Patient not taking: Reported on 09/14/2015) 30 tablet 0   No current facility-administered medications on file prior to visit.     Objective:  Objective Physical Exam  Constitutional: He is oriented to person, place, and time. Vital signs are normal.  He appears well-developed and well-nourished. He is sleeping.  HENT:  Head: Normocephalic and atraumatic.  Mouth/Throat: Oropharynx is clear and moist.  Eyes: EOM are normal. Pupils are equal, round, and reactive to light.  Neck: Normal range of motion. Neck supple. No thyromegaly present.  Cardiovascular: Normal rate and regular rhythm.   No murmur heard. Pulmonary/Chest: Effort normal and breath sounds normal. No respiratory distress. He has no wheezes. He has no rales. He exhibits no tenderness.  Musculoskeletal: He exhibits no edema or tenderness.       Right ankle: He exhibits swelling.       Left ankle: He exhibits swelling.  Neurological: He is alert and oriented to person, place, and time.  Skin: Skin is warm and dry.     Psychiatric: He has a normal mood and affect. His behavior is normal. Judgment and thought content normal.  Nursing note and vitals reviewed.  BP 116/72 mmHg  Pulse 57  Temp(Src) 98.2 F (36.8 C) (Oral)  Ht 6' (1.829 m)  Wt 190 lb 12.8 oz (86.546 kg)  BMI 25.87 kg/m2  SpO2 95% Wt Readings from Last 3 Encounters:  09/14/15 190 lb 12.8 oz (86.546 kg)  09/09/15 190 lb 3.2 oz (86.274 kg)  09/07/15 188 lb (85.276 kg)     Lab Results  Component Value Date   WBC 5.9 09/07/2015   HGB 13.9 09/07/2015   HCT 41.3 09/07/2015   PLT 132* 09/07/2015   GLUCOSE 115* 09/07/2015   CHOL 127 08/05/2015   TRIG 66.0 08/05/2015   HDL 40.40 08/05/2015   LDLCALC 73 08/05/2015   ALT 22 09/07/2015   AST 23 09/07/2015   NA 138 09/07/2015   K 4.2 09/07/2015   CL 106 09/07/2015   CREATININE 0.95 09/07/2015   BUN 25* 09/07/2015   CO2 26 09/07/2015   TSH 2.80 05/06/2015   PSA 0.98 05/06/2015   INR 0.96 09/07/2015  ekg--- no acute changes  Ct Abdomen Pelvis W Contrast  09/07/2015  CLINICAL DATA:  Fall from counter with left back pain. Initial encounter. EXAM: CT ABDOMEN AND PELVIS WITH CONTRAST TECHNIQUE: Multidetector CT imaging of the abdomen and pelvis was  performed using the standard protocol following bolus administration of intravenous contrast. CONTRAST:  193mL ISOVUE-300 IOPAMIDOL (ISOVUE-300) INJECTION 61% COMPARISON:  None. FINDINGS: Lower chest and abdominal wall:  Subsegmental basilar atelectasis. Extensive atherosclerotic calcifications seen in the right coronary circulation. ICD/pacer lead into the right ventricle. Small sliding hiatal hernia. Hepatobiliary: No focal liver abnormality.No evidence of biliary obstruction or stone. Pancreas: Unremarkable. Spleen: Chronic granulomatous changes. Adrenals/Urinary Tract: Negative adrenals. No hydronephrosis or stone. Unremarkable bladder. Reproductive:No pathologic findings. Stomach/Bowel:  No obstruction. No appendicitis. Vascular/Lymphatic: No acute vascular abnormality. Aortic and branch vessel atherosclerotic calcification. No mass or adenopathy. Peritoneal: No ascites or pneumoperitoneum. Musculoskeletal: Mildly displaced posterior left ninth rib fracture. Gluteal subcutaneous contusion on the left without pelvic ring fracture or diastasis. Chronic left L5 pars defect with trace anterolisthesis. Focal L1-2 degenerative disc  narrowing. IMPRESSION: 1. Mildly displaced left ninth rib fracture. 2. Left gluteal contusion. 3. No evidence of intra-abdominal injury. Electronically Signed   By: Monte Fantasia M.D.   On: 09/07/2015 18:15     Assessment & Plan:  Plan I am having Mr. Dibella start on hydrochlorothiazide. I am also having him maintain his aspirin EC, clopidogrel, aluminum hydroxide-magnesium carbonate, simvastatin, lisinopril, levothyroxine, atenolol, pantoprazole, HYDROcodone-acetaminophen, lidocaine, and traMADol.  Meds ordered this encounter  Medications  . hydrochlorothiazide (MICROZIDE) 12.5 MG capsule    Sig: Take 1 capsule (12.5 mg total) by mouth daily.    Dispense:  30 capsule    Refill:  2    Problem List Items Addressed This Visit    Abrasion of right arm    Dressing changed in  office today      Chest pain    ekg no changes Keep f/u with cardiology      Relevant Orders   EKG 12-Lead (Completed)   Hyperlipidemia - Primary   Relevant Medications   hydrochlorothiazide (MICROZIDE) 12.5 MG capsule   Other Relevant Orders   Hemoglobin A1c   Comprehensive metabolic panel   Rib fracture    Slowly improving F/u prn         Other Visit Diagnoses    Closed rib fracture, left, with delayed healing, subsequent encounter        Relevant Orders    DG Ribs Unilateral W/Chest Left    Edema, unspecified type        Relevant Medications    hydrochlorothiazide (MICROZIDE) 12.5 MG capsule    Essential hypertension        Relevant Medications    hydrochlorothiazide (MICROZIDE) 12.5 MG capsule       Follow-up: Return in about 3 weeks (around 10/05/2015), or if symptoms worsen or fail to improve, for hypertension.  Ann Held, DO

## 2015-09-14 NOTE — Progress Notes (Signed)
Pre visit review using our clinic review tool, if applicable. No additional management support is needed unless otherwise documented below in the visit note. 

## 2015-09-14 NOTE — Assessment & Plan Note (Signed)
Slowly improving F/u prn

## 2015-09-14 NOTE — Assessment & Plan Note (Signed)
Dressing changed in office today

## 2015-09-14 NOTE — Assessment & Plan Note (Signed)
ekg no changes Keep f/u with cardiology

## 2015-09-14 NOTE — Patient Instructions (Signed)
Edema °Edema is an abnormal buildup of fluids in your body tissues. Edema is somewhat dependent on gravity to pull the fluid to the lowest place in your body. That makes the condition more common in the legs and thighs (lower extremities). Painless swelling of the feet and ankles is common and becomes more likely as you get older. It is also common in looser tissues, like around your eyes.  °When the affected area is squeezed, the fluid may move out of that spot and leave a dent for a few moments. This dent is called pitting.  °CAUSES  °There are many possible causes of edema. Eating too much salt and being on your feet or sitting for a long time can cause edema in your legs and ankles. Hot weather may make edema worse. Common medical causes of edema include: °· Heart failure. °· Liver disease. °· Kidney disease. °· Weak blood vessels in your legs. °· Cancer. °· An injury. °· Pregnancy. °· Some medications. °· Obesity.  °SYMPTOMS  °Edema is usually painless. Your skin may look swollen or shiny.  °DIAGNOSIS  °Your health care provider may be able to diagnose edema by asking about your medical history and doing a physical exam. You may need to have tests such as X-rays, an electrocardiogram, or blood tests to check for medical conditions that may cause edema.  °TREATMENT  °Edema treatment depends on the cause. If you have heart, liver, or kidney disease, you need the treatment appropriate for these conditions. General treatment may include: °· Elevation of the affected body part above the level of your heart. °· Compression of the affected body part. Pressure from elastic bandages or support stockings squeezes the tissues and forces fluid back into the blood vessels. This keeps fluid from entering the tissues. °· Restriction of fluid and salt intake. °· Use of a water pill (diuretic). These medications are appropriate only for some types of edema. They pull fluid out of your body and make you urinate more often. This  gets rid of fluid and reduces swelling, but diuretics can have side effects. Only use diuretics as directed by your health care provider. °HOME CARE INSTRUCTIONS  °· Keep the affected body part above the level of your heart when you are lying down.   °· Do not sit still or stand for prolonged periods.   °· Do not put anything directly under your knees when lying down. °· Do not wear constricting clothing or garters on your upper legs.   °· Exercise your legs to work the fluid back into your blood vessels. This may help the swelling go down.   °· Wear elastic bandages or support stockings to reduce ankle swelling as directed by your health care provider.   °· Eat a low-salt diet to reduce fluid if your health care provider recommends it.   °· Only take medicines as directed by your health care provider.  °SEEK MEDICAL CARE IF:  °· Your edema is not responding to treatment. °· You have heart, liver, or kidney disease and notice symptoms of edema. °· You have edema in your legs that does not improve after elevating them.   °· You have sudden and unexplained weight gain. °SEEK IMMEDIATE MEDICAL CARE IF:  °· You develop shortness of breath or chest pain.   °· You cannot breathe when you lie down. °· You develop pain, redness, or warmth in the swollen areas.   °· You have heart, liver, or kidney disease and suddenly get edema. °· You have a fever and your symptoms suddenly get worse. °MAKE SURE YOU:  °·   Understand these instructions. °· Will watch your condition. °· Will get help right away if you are not doing well or get worse. °  °This information is not intended to replace advice given to you by your health care provider. Make sure you discuss any questions you have with your health care provider. °  °Document Released: 03/27/2005 Document Revised: 04/17/2014 Document Reviewed: 01/17/2013 °Elsevier Interactive Patient Education ©2016 [employer] ° °

## 2015-09-17 NOTE — Telephone Encounter (Signed)
PA submitted to [place]. Awaiting determination. JG//CMA

## 2015-09-20 NOTE — Telephone Encounter (Signed)
Please see below for approval of Lidocaine patches.

## 2015-09-20 NOTE — Telephone Encounter (Signed)
Informed pt.  Faxed [facility] ([place]) printout from [email or web address] confirming approval.   No further questions/concerns. JG//CMA

## 2015-09-20 NOTE — Telephone Encounter (Signed)
PA approved effective from 09/17/2015 through 12/18/2015.  [email or web address] key: [id number]

## 2015-09-20 NOTE — Telephone Encounter (Signed)
Inform pt

## 2015-09-29 ENCOUNTER — Encounter: Payer: Self-pay | Admitting: Internal Medicine

## 2015-09-29 ENCOUNTER — Ambulatory Visit (INDEPENDENT_AMBULATORY_CARE_PROVIDER_SITE_OTHER): Payer: BLUE CROSS/BLUE SHIELD | Admitting: Internal Medicine

## 2015-09-29 VITALS — BP 106/60 | HR 60 | Ht 72.0 in | Wt 187.0 lb

## 2015-09-29 DIAGNOSIS — I255 Ischemic cardiomyopathy: Secondary | ICD-10-CM | POA: Diagnosis not present

## 2015-09-29 DIAGNOSIS — I469 Cardiac arrest, cause unspecified: Secondary | ICD-10-CM

## 2015-09-29 DIAGNOSIS — Z95810 Presence of automatic (implantable) cardiac defibrillator: Secondary | ICD-10-CM

## 2015-09-29 MED ORDER — SPIRONOLACTONE 25 MG PO TABS: 12.5000 mg | ORAL_TABLET | Freq: Every day | ORAL | Status: DC

## 2015-09-29 NOTE — Progress Notes (Signed)
Patient Care Team: Ann Held, DO as PCP - General (Family Medicine) Deboraha Sprang, MD as Consulting Physician (Cardiology) Jerene Bears, MD as Consulting Physician (Gastroenterology) Burnell Blanks, MD as Consulting Physician (Cardiology) Angelia Mould, MD as Consulting Physician (Vascular Surgery)   HPI  Jeremy Walker is a 65 y.o. male Followup for a reported cardiac arrest that happened recurrently many years ago. On both occasions he were associated with profound hypokalemia the cause of which was never elucidated. He is status post ICD implantation and prior generator replacement.he underwent generator replacement again June 2012    He had complaints of shortness of breath last year underwent Myoview scanning >>>demonstrated inferior and inferolateral ischemia, normal LV function. Cardiac catheterization was arranged. This was performed 04/08/15 and demonstrated mid RCA occlusion which was treated with Promus DES 2.   Dual antiplatelet therapy was recommended for one year.   He has had bleeding with superficial trauma.  He fell off the kitchen counter while changing a light bulb.  No significant bleeding was noted. He was seen in the emergency room. His head was not imaged. He has had no headaches.   Intercurrently, he is also seen his PCP who noted that he had edema. He was treated with HCTZ. Edema resolved.    Past Medical History      Past Medical History  Diagnosis Date  . Sudden cardiac death ([place]) 1994    aborted  . AICD (automatic cardioverter/defibrillator) present     Abdominal implant was 0064 Guidant lead 6/5 mm  . Hypokalemia     Recurrent associated with cardiac arrest  . Tobacco abuse     02-16-14 past hx. heavy smoker, quit < 73yr.  Marland Kitchen Anxiety   . Depression   . HLD (hyperlipidemia)   . Thyroid disease   . Atrial fibrillation ([place])   . History of hiatal hernia   . Hypothyroidism   . Childhood asthma   . GERD  (gastroesophageal reflux disease)   . CAD (coronary artery disease) 03/2015    a. DESx2 to mid and distal RCA    Past Surgical History  Procedure Laterality Date  . Cardiac defibrillator placement  1991    guidant  . Hand surgery Left 1965    "got it caught in a conveyor belt"  . Eus N/A 02/26/2014    Procedure: ESOPHAGEAL ENDOSCOPIC ULTRASOUND (EUS) RADIAL;  Surgeon: Milus Banister, MD;  Location: [facility];  Service: Endoscopy;  Laterality: N/A;  radial linear  . Cardiac catheterization N/A 04/08/2015    Procedure: Left Heart Cath and Coronary Angiography;  Surgeon: Burnell Blanks, MD;  Location: [facility];  Service: Cardiovascular;  Laterality: N/A;  . Cardiac catheterization N/A 04/08/2015    Procedure: Coronary Stent Intervention;  Surgeon: Burnell Blanks, MD;  Location: [facility];  Service: Cardiovascular;  Laterality: N/A;    Current Outpatient Prescriptions  Medication Sig Dispense Refill  . aluminum hydroxide-magnesium carbonate (GAVISCON) 95-358 MG/15ML SUSP Take 30 mLs by mouth 3 (three) times daily as needed for indigestion or heartburn.     Marland Kitchen aspirin EC 81 MG tablet Take 1 tablet (81 mg total) by mouth daily. 90 tablet 3  . atenolol (TENORMIN) 50 MG tablet TAKE ONE TABLET BY MOUTH ONCE DAILY 90 tablet 3  . clopidogrel (PLAVIX) 75 MG tablet Take 1 tablet (75 mg total) by mouth daily with breakfast. 30 tablet 6  . hydrochlorothiazide (MICROZIDE) 12.5 MG  capsule Take 1 capsule (12.5 mg total) by mouth daily. 30 capsule 2  . HYDROcodone-acetaminophen (NORCO/VICODIN) 5-325 MG tablet Take 1-2 tablets by mouth every 4 (four) hours as needed for moderate pain or severe pain. 20 tablet 0  . levothyroxine (SYNTHROID, LEVOTHROID) 50 MCG tablet TAKE 1 TABLET(50 MCG) BY MOUTH DAILY 30 tablet 5  . lidocaine (LIDODERM) 5 % Place 1 patch onto the skin daily. Remove & Discard patch within 12 hours or as directed by MD 30 patch 0  . lisinopril  (PRINIVIL,ZESTRIL) 2.5 MG tablet Take 1 tablet (2.5 mg total) by mouth daily.    . pantoprazole (PROTONIX) 40 MG tablet Take 1 tablet (40 mg total) by mouth 2 (two) times daily. 30 tablet 5  . simvastatin (ZOCOR) 40 MG tablet Take 1 tablet (40 mg total) by mouth daily at 6 PM. 90 tablet 3  . traMADol (ULTRAM) 50 MG tablet Take by mouth every 6 (six) hours as needed (pain).     No current facility-administered medications for this visit.    No Known Allergies  Review of Systems negative except from HPI and PMH  Physical Exam BP 106/60 mmHg  Pulse 60  Ht 6' (1.829 m)  Wt 187 lb (84.823 kg)  BMI 25.36 kg/m2  SpO2 98% Well developed and well nourished in no acute distress HENT normal E scleral and icterus clear Neck Supple JVP flat; carotids brisk and full Clear to ausculation  Regular rate and rhythm, no murmurs gallops or rub Soft with active bowel sounds No clubbing cyanosis none Edema Alert and oriented, grossly normal motor and sensory function Skin Warm and Dry  ECG NSR  18/09/43  Assessment and  Plan  Aborted ardiac arrest  No intercurrent Ventricular tachycardia  Implantable defibrillator  The patient's device was interrogated.  The information was reviewed. No changes were made in the programming.    Chest pain following fall  Heart failure-chronic-improved-systolic/diastolic    His edema has resolved. He has a history of hypokalemia so will discontinue his hydromorphone thiazide use Aldactone. We will check his metabolic profile today and again in about 2 weeks time

## 2015-09-29 NOTE — Patient Instructions (Signed)
Medication Instructions: - Your physician has recommended you make the following change in your medication:  1) Stop HCTZ (hydrochlorothiazide) 2) Start aldactone (spironolactone) 25 mg 1/2 tablet (12.5 mg) once daily  Labwork: - Your physician recommends that you have lab work today: [place] physician recommends that you return for lab work in: 2 weeks- BMP  Procedures/Testing: - none  Follow-Up: - Your physician wants you to follow-up in: 6 months with Tommye Standard, PA & 1 year with Dr. Caryl Comes. You will receive a reminder letter in the mail two months in advance. If you don't receive a letter, please call our office to schedule the follow-up appointment.  Any Additional Special Instructions Will Be Listed Below (If Applicable).     If you need a refill on your cardiac medications before your next appointment, please call your pharmacy.

## 2015-09-29 NOTE — Progress Notes (Signed)
Patient Care Team: Ann Held, DO as PCP - General (Family Medicine) Deboraha Sprang, MD as Consulting Physician (Cardiology) Jerene Bears, MD as Consulting Physician (Gastroenterology) Burnell Blanks, MD as Consulting Physician (Cardiology) Angelia Mould, MD as Consulting Physician (Vascular Surgery)   HPI  Jeremy Walker is a 65 y.o. male Followup for a reported cardiac arrest that happened recurrently many years ago. On both occasions he were associated with profound hypokalemia the cause of which was never elucidated. He is status post ICD implantation and prior generator replacement.he underwent generator replacement again June 2012       He has complaints of dyspnea on exertion. This is unassociated with chest pain.  We undertook myoview scanning >>> demonstrated inferior and inferolateral ischemia, normal LV function. Cardiac catheterization was arranged. This was performed 04/08/15 and demonstrated mid RCA occlusion which was treated with Promus DES 2. There was concern for possible thrombus in the LV apex on left ventriculogram. Echocardiogram was obtained and demonstrated EF 40-45%. There is no evidence of thrombus.   Recently fell no head aches  Trouble sleeping since then  Seen in ER with abd chest CT but not head  Saw PCP with edema and started on HCTZ    Past Medical History      Past Medical History  Diagnosis Date  . Sudden cardiac death ([place]) 1994    aborted  . AICD (automatic cardioverter/defibrillator) present     Abdominal implant was 0064 Guidant lead 6/5 mm  . Hypokalemia     Recurrent associated with cardiac arrest  . Tobacco abuse     02-16-14 past hx. heavy smoker, quit < 80yr.  Marland Kitchen Anxiety   . Depression   . HLD (hyperlipidemia)   . Thyroid disease   . Atrial fibrillation ([place])   . History of hiatal hernia   . Hypothyroidism   . Childhood asthma   . GERD (gastroesophageal reflux disease)   . CAD (coronary artery  disease) 03/2015    a. DESx2 to mid and distal RCA    Past Surgical History  Procedure Laterality Date  . Cardiac defibrillator placement  1991    guidant  . Hand surgery Left 1965    "got it caught in a conveyor belt"  . Eus N/A 02/26/2014    Procedure: ESOPHAGEAL ENDOSCOPIC ULTRASOUND (EUS) RADIAL;  Surgeon: Milus Banister, MD;  Location: [facility];  Service: Endoscopy;  Laterality: N/A;  radial linear  . Cardiac catheterization N/A 04/08/2015    Procedure: Left Heart Cath and Coronary Angiography;  Surgeon: Burnell Blanks, MD;  Location: [facility];  Service: Cardiovascular;  Laterality: N/A;  . Cardiac catheterization N/A 04/08/2015    Procedure: Coronary Stent Intervention;  Surgeon: Burnell Blanks, MD;  Location: [facility];  Service: Cardiovascular;  Laterality: N/A;    Current Outpatient Prescriptions  Medication Sig Dispense Refill  . aluminum hydroxide-magnesium carbonate (GAVISCON) 95-358 MG/15ML SUSP Take 30 mLs by mouth 3 (three) times daily as needed for indigestion or heartburn.     Marland Kitchen aspirin EC 81 MG tablet Take 1 tablet (81 mg total) by mouth daily. 90 tablet 3  . atenolol (TENORMIN) 50 MG tablet TAKE ONE TABLET BY MOUTH ONCE DAILY 90 tablet 3  . clopidogrel (PLAVIX) 75 MG tablet Take 1 tablet (75 mg total) by mouth daily with breakfast. 30 tablet 6  . hydrochlorothiazide (MICROZIDE) 12.5 MG capsule Take 1 capsule (12.5 mg total) by  mouth daily. 30 capsule 2  . HYDROcodone-acetaminophen (NORCO/VICODIN) 5-325 MG tablet Take 1-2 tablets by mouth every 4 (four) hours as needed for moderate pain or severe pain. 20 tablet 0  . levothyroxine (SYNTHROID, LEVOTHROID) 50 MCG tablet TAKE 1 TABLET(50 MCG) BY MOUTH DAILY 30 tablet 5  . lidocaine (LIDODERM) 5 % Place 1 patch onto the skin daily. Remove & Discard patch within 12 hours or as directed by MD 30 patch 0  . lisinopril (PRINIVIL,ZESTRIL) 2.5 MG tablet Take 1 tablet (2.5 mg total) by mouth  daily.    . pantoprazole (PROTONIX) 40 MG tablet Take 1 tablet (40 mg total) by mouth 2 (two) times daily. 30 tablet 5  . simvastatin (ZOCOR) 40 MG tablet Take 1 tablet (40 mg total) by mouth daily at 6 PM. 90 tablet 3  . traMADol (ULTRAM) 50 MG tablet Take by mouth every 6 (six) hours as needed (pain).     No current facility-administered medications for this visit.    No Known Allergies  Review of Systems negative except from HPI and PMH  Physical Exam BP 106/60 mmHg  Pulse 60  Ht 6' (1.829 m)  Wt 187 lb (84.823 kg)  BMI 25.36 kg/m2  SpO2 98% Well developed and well nourished in no acute distress HENT normal E scleral and icterus clear Neck Supple JVP flat; carotids brisk and full Clear to ausculation  Regular rate and rhythm, no murmurs gallops or rub Soft with active bowel sounds No clubbing cyanosis none Edema Alert and oriented, grossly normal motor and sensory function Skin Warm and Dry  ECG NSR  18/09/43  Assessment and  Plan  Aborted ardiac arrest  No intercurrent Ventricular tachycardia  Implantable defibrillator  The patient's device was interrogated.  The information was reviewed. No changes were made in the programming.    Dyspnea on exertion  The patient has worsening dyspnea. This may be related to his weight gain. He is no longer smoking.  Chest x-ray from 2012 demonstrated hyperinflation. There has been no interval imaging  With hx of hypokalemia and VF will stop HCTZ and begin aldactone  Will check BMET today and repeat in 2 weeks

## 2015-09-30 ENCOUNTER — Other Ambulatory Visit: Payer: Self-pay | Admitting: *Deleted

## 2015-09-30 LAB — BASIC METABOLIC PANEL
BUN: 20 mg/dL (ref 7–25)
CO2: 26 mmol/L (ref 20–31)
Calcium: 9 mg/dL (ref 8.6–10.3)
Chloride: 102 mmol/L (ref 98–110)
Creat: 0.87 mg/dL (ref 0.70–1.25)
Glucose, Bld: 94 mg/dL (ref 65–99)
POTASSIUM: 3.9 mmol/L (ref 3.5–5.3)
Sodium: 139 mmol/L (ref 135–146)

## 2015-09-30 MED ORDER — SPIRONOLACTONE 25 MG PO TABS: 12.5000 mg | ORAL_TABLET | Freq: Every day | ORAL | Status: DC

## 2015-10-06 ENCOUNTER — Telehealth: Payer: Self-pay | Admitting: *Deleted

## 2015-10-06 NOTE — Telephone Encounter (Signed)
-----   Message from Deboraha Sprang, MD sent at 10/04/2015  5:09 PM EDT ----- Please Inform Patient that labs are normal

## 2015-10-13 ENCOUNTER — Other Ambulatory Visit (INDEPENDENT_AMBULATORY_CARE_PROVIDER_SITE_OTHER): Payer: BLUE CROSS/BLUE SHIELD | Admitting: *Deleted

## 2015-10-13 DIAGNOSIS — I255 Ischemic cardiomyopathy: Secondary | ICD-10-CM | POA: Diagnosis not present

## 2015-10-13 LAB — BASIC METABOLIC PANEL
BUN: 28 mg/dL — AB (ref 7–25)
CHLORIDE: 105 mmol/L (ref 98–110)
CO2: 26 mmol/L (ref 20–31)
CREATININE: 1.05 mg/dL (ref 0.70–1.25)
Calcium: 9.4 mg/dL (ref 8.6–10.3)
GLUCOSE: 117 mg/dL — AB (ref 65–99)
Potassium: 5 mmol/L (ref 3.5–5.3)
Sodium: 140 mmol/L (ref 135–146)

## 2015-10-13 NOTE — Addendum Note (Signed)
Addended by: Eulis Rohlman on: 10/13/2015 08:59 AM   Modules accepted: Orders

## 2015-10-19 ENCOUNTER — Other Ambulatory Visit: Payer: Self-pay | Admitting: *Deleted

## 2015-10-19 DIAGNOSIS — E875 Hyperkalemia: Secondary | ICD-10-CM

## 2015-10-20 ENCOUNTER — Telehealth: Payer: Self-pay | Admitting: *Deleted

## 2015-10-20 NOTE — Telephone Encounter (Signed)
[facility]ospice Dr called and and talked with Jeremy Walker yesterday----- he said they were talking to SW---- please tell him to call if he needs more help----- Dr Mena Pauls is very good and SW should be able to help ---
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
## 2015-11-04 ENCOUNTER — Ambulatory Visit (INDEPENDENT_AMBULATORY_CARE_PROVIDER_SITE_OTHER): Payer: BLUE CROSS/BLUE SHIELD | Admitting: Family Medicine

## 2015-11-04 ENCOUNTER — Encounter: Payer: Self-pay | Admitting: Family Medicine

## 2015-11-04 VITALS — BP 105/66 | HR 56 | Temp 97.6°F | Resp 18 | Ht 74.0 in | Wt 183.8 lb

## 2015-11-04 DIAGNOSIS — F329 Major depressive disorder, single episode, unspecified: Secondary | ICD-10-CM | POA: Diagnosis not present

## 2015-11-04 DIAGNOSIS — F32A Depression, unspecified: Secondary | ICD-10-CM

## 2015-11-04 MED ORDER — SERTRALINE HCL 50 MG PO TABS: 50.0000 mg | ORAL_TABLET | Freq: Every day | ORAL | 3 refills | Status: DC

## 2015-11-04 NOTE — Progress Notes (Signed)
Pre visit review using our clinic review tool, if applicable. No additional management support is needed unless otherwise documented below in the visit note. 

## 2015-11-04 NOTE — Patient Instructions (Signed)
Major Depressive Disorder Major depressive disorder is a mental illness. It also may be called clinical depression or unipolar depression. Major depressive disorder usually causes feelings of sadness, hopelessness, or helplessness. Some people with this disorder do not feel particularly sad but lose interest in doing things they used to enjoy (anhedonia). Major depressive disorder also can cause physical symptoms. It can interfere with work, school, relationships, and other normal everyday activities. The disorder varies in severity but is longer lasting and more serious than the sadness we all feel from time to time in our lives. Major depressive disorder often is triggered by stressful life events or major life changes. Examples of these triggers include divorce, loss of your job or home, a move, and the death of a family member or close friend. Sometimes this disorder occurs for no obvious reason at all. People who have family members with major depressive disorder or bipolar disorder are at higher risk for developing this disorder, with or without life stressors. Major depressive disorder can occur at any age. It may occur just once in your life (single episode major depressive disorder). It may occur multiple times (recurrent major depressive disorder). SYMPTOMS People with major depressive disorder have either anhedonia or depressed mood on nearly a daily basis for at least 2 weeks or longer. Symptoms of depressed mood include:  Feelings of sadness (blue or down in the dumps) or emptiness.  Feelings of hopelessness or helplessness.  Tearfulness or episodes of crying (may be observed by others).  Irritability (children and adolescents). In addition to depressed mood or anhedonia or both, people with this disorder have at least four of the following symptoms:  Difficulty sleeping or sleeping too much.   Significant change (increase or decrease) in appetite or weight.   Lack of energy or  motivation.  Feelings of guilt and worthlessness.   Difficulty concentrating, remembering, or making decisions.  Unusually slow movement (psychomotor retardation) or restlessness (as observed by others).   Recurrent wishes for death, recurrent thoughts of self-harm (suicide), or a suicide attempt. People with major depressive disorder commonly have persistent negative thoughts about themselves, other people, and the world. People with severe major depressive disorder may experiencedistorted beliefs or perceptions about the world (psychotic delusions). They also may see or hear things that are not real (psychotic hallucinations). DIAGNOSIS Major depressive disorder is diagnosed through an assessment by your health care provider. Your health care provider will ask aboutaspects of your daily life, such as mood,sleep, and appetite, to see if you have the diagnostic symptoms of major depressive disorder. Your health care provider may ask about your medical history and use of alcohol or drugs, including prescription medicines. Your health care provider also may do a physical exam and blood work. This is because certain medical conditions and the use of certain substances can cause major depressive disorder-like symptoms (secondary depression). Your health care provider also may refer you to a mental health specialist for further evaluation and treatment. TREATMENT It is important to recognize the symptoms of major depressive disorder and seek treatment. The following treatments can be prescribed for this disorder:   Medicine. Antidepressant medicines usually are prescribed. Antidepressant medicines are thought to correct chemical imbalances in the brain that are commonly associated with major depressive disorder. Other types of medicine may be added if the symptoms do not respond to antidepressant medicines alone or if psychotic delusions or hallucinations occur.  Talk therapy. Talk therapy can be  helpful in treating major depressive disorder by providing   support, education, and guidance. Certain types of talk therapy also can help with negative thinking (cognitive behavioral therapy) and with relationship issues that trigger this disorder (interpersonal therapy). A mental health specialist can help determine which treatment is best for you. Most people with major depressive disorder do well with a combination of medicine and talk therapy. Treatments involving electrical stimulation of the brain can be used in situations with extremely severe symptoms or when medicine and talk therapy do not work over time. These treatments include electroconvulsive therapy, transcranial magnetic stimulation, and vagal nerve stimulation.   This information is not intended to replace advice given to you by your health care provider. Make sure you discuss any questions you have with your health care provider.   Document Released: 07/22/2012 Document Revised: 04/17/2014 Document Reviewed: 07/22/2012 Elsevier Interactive Patient Education 2016 [employer] Inc[employer]  

## 2015-11-04 NOTE — Progress Notes (Signed)
Patient ID: Jeremy Walker, male    DOB: Oct 29, 1950  Age: 65 y.o. MRN: [id number]    Subjective:  Subjective  HPI Jasyn Marra presents for c/o depression--- he has been very depressed esp about issues with his wife's med issues.   Review of Systems  Constitutional: Negative for appetite change, diaphoresis, fatigue and unexpected weight change.  Eyes: Negative for pain, redness and visual disturbance.  Respiratory: Negative for cough, chest tightness, shortness of breath and wheezing.   Cardiovascular: Negative for chest pain, palpitations and leg swelling.  Endocrine: Negative for cold intolerance, heat intolerance, polydipsia, polyphagia and polyuria.  Genitourinary: Negative for difficulty urinating, dysuria and frequency.  Neurological: Negative for dizziness, light-headedness, numbness and headaches.  Psychiatric/Behavioral: Positive for decreased concentration and dysphoric mood. Negative for agitation, behavioral problems, confusion, hallucinations, self-injury, sleep disturbance and suicidal ideas. The patient is nervous/anxious. The patient is not hyperactive.     History Past Medical History:  Diagnosis Date  . AICD (automatic cardioverter/defibrillator) present    Abdominal implant was 0064 Guidant lead 6/5 mm  . Anxiety   . Atrial fibrillation ([place])   . CAD (coronary artery disease) 03/2015   a. DESx2 to mid and distal RCA  . Childhood asthma   . Depression   . GERD (gastroesophageal reflux disease)   . History of hiatal hernia   . HLD (hyperlipidemia)   . Hypokalemia    Recurrent associated with cardiac arrest  . Hypothyroidism   . Sudden cardiac death ([place]) 1994   aborted  . Thyroid disease   . Tobacco abuse    02-16-14 past hx. heavy smoker, quit < 6yr.    He has a past surgical history that includes Cardiac defibrillator placement (1991); Hand surgery (Left, 1965); EUS (N/A, 02/26/2014); Cardiac catheterization (N/A, 04/08/2015); and Cardiac catheterization  (N/A, 04/08/2015).   His family history includes Cancer in his father; Diabetes in his brother, maternal aunt, and mother; Heart failure in his mother; Lung cancer in his brother; Ovarian cancer in his mother; Parkinson's disease in his mother; Scoliosis in his mother.He reports that he quit smoking about 2 years ago. His smoking use included Cigarettes. He has a 63.00 pack-year smoking history. He has never used smokeless tobacco. He reports that he does not drink alcohol or use drugs.  Current Outpatient Prescriptions on File Prior to Visit  Medication Sig Dispense Refill  . aluminum hydroxide-magnesium carbonate (GAVISCON) 95-358 MG/15ML SUSP Take 30 mLs by mouth 3 (three) times daily as needed for indigestion or heartburn.     Marland Kitchen aspirin EC 81 MG tablet Take 1 tablet (81 mg total) by mouth daily. 90 tablet 3  . atenolol (TENORMIN) 50 MG tablet TAKE ONE TABLET BY MOUTH ONCE DAILY 90 tablet 3  . clopidogrel (PLAVIX) 75 MG tablet Take 1 tablet (75 mg total) by mouth daily with breakfast. 30 tablet 6  . HYDROcodone-acetaminophen (NORCO/VICODIN) 5-325 MG tablet Take 1-2 tablets by mouth every 4 (four) hours as needed for moderate pain or severe pain. 20 tablet 0  . levothyroxine (SYNTHROID, LEVOTHROID) 50 MCG tablet TAKE 1 TABLET(50 MCG) BY MOUTH DAILY 30 tablet 5  . lidocaine (LIDODERM) 5 % Place 1 patch onto the skin daily. Remove & Discard patch within 12 hours or as directed by MD 30 patch 0  . lisinopril (PRINIVIL,ZESTRIL) 2.5 MG tablet Take 1 tablet (2.5 mg total) by mouth daily.    . pantoprazole (PROTONIX) 40 MG tablet Take 1 tablet (40 mg total) by mouth 2 (two) times daily.  30 tablet 5  . simvastatin (ZOCOR) 40 MG tablet Take 1 tablet (40 mg total) by mouth daily at 6 PM. 90 tablet 3  . spironolactone (ALDACTONE) 25 MG tablet Take 0.5 tablets (12.5 mg total) by mouth daily. 45 tablet 3  . traMADol (ULTRAM) 50 MG tablet Take by mouth every 6 (six) hours as needed (pain).     No current  facility-administered medications on file prior to visit.      Objective:  Objective  Physical Exam  Constitutional: He is oriented to person, place, and time. Vital signs are normal. He appears well-developed and well-nourished. He is sleeping.  HENT:  Head: Normocephalic and atraumatic.  Mouth/Throat: Oropharynx is clear and moist.  Eyes: EOM are normal. Pupils are equal, round, and reactive to light.  Neck: Normal range of motion. Neck supple. No thyromegaly present.  Cardiovascular: Normal rate and regular rhythm.   No murmur heard. Pulmonary/Chest: Effort normal and breath sounds normal. No respiratory distress. He has no wheezes. He has no rales. He exhibits no tenderness.  Musculoskeletal: He exhibits no edema or tenderness.  Neurological: He is alert and oriented to person, place, and time.  Skin: Skin is warm and dry.  Psychiatric: His speech is normal and behavior is normal. Judgment normal. His mood appears not anxious. His affect is not angry, not blunt, not labile and not inappropriate. Cognition and memory are normal. He exhibits a depressed mood. He expresses homicidal and suicidal ideation. He expresses no homicidal plans.  Pt is needing respite care.  He is afraid he will hurt her or himself.  He does not have a plan of what he would do to her or himself   Nursing note and vitals reviewed.  BP 105/66 (BP Location: Right Arm, Patient Position: Sitting)   Pulse (!) 56   Temp 97.6 F (36.4 C)   Resp 18   Ht 6\' 2"  (1.88 m)   Wt 183 lb 12.8 oz (83.4 kg)   SpO2 95%   BMI 23.60 kg/m  Wt Readings from Last 3 Encounters:  11/04/15 183 lb 12.8 oz (83.4 kg)  09/29/15 187 lb (84.8 kg)  09/14/15 190 lb 12.8 oz (86.5 kg)     Lab Results  Component Value Date   WBC 5.9 09/07/2015   HGB 13.9 09/07/2015   HCT 41.3 09/07/2015   PLT 132 (L) 09/07/2015   GLUCOSE 117 (H) 10/13/2015   CHOL 127 08/05/2015   TRIG 66.0 08/05/2015   HDL 40.40 08/05/2015   LDLCALC 73 08/05/2015    ALT 22 09/07/2015   AST 23 09/07/2015   NA 140 10/13/2015   K 5.0 10/13/2015   CL 105 10/13/2015   CREATININE 1.05 10/13/2015   BUN 28 (H) 10/13/2015   CO2 26 10/13/2015   TSH 2.80 05/06/2015   PSA 0.98 05/06/2015   INR 0.96 09/07/2015    Ct Abdomen Pelvis W Contrast  Result Date: 09/07/2015 CLINICAL DATA:  Fall from counter with left back pain. Initial encounter. EXAM: CT ABDOMEN AND PELVIS WITH CONTRAST TECHNIQUE: Multidetector CT imaging of the abdomen and pelvis was performed using the standard protocol following bolus administration of intravenous contrast. CONTRAST:  120mL ISOVUE-300 IOPAMIDOL (ISOVUE-300) INJECTION 61% COMPARISON:  None. FINDINGS: Lower chest and abdominal wall:  Subsegmental basilar atelectasis. Extensive atherosclerotic calcifications seen in the right coronary circulation. ICD/pacer lead into the right ventricle. Small sliding hiatal hernia. Hepatobiliary: No focal liver abnormality.No evidence of biliary obstruction or stone. Pancreas: Unremarkable. Spleen: Chronic granulomatous changes. Adrenals/Urinary  Tract: Negative adrenals. No hydronephrosis or stone. Unremarkable bladder. Reproductive:No pathologic findings. Stomach/Bowel:  No obstruction. No appendicitis. Vascular/Lymphatic: No acute vascular abnormality. Aortic and branch vessel atherosclerotic calcification. No mass or adenopathy. Peritoneal: No ascites or pneumoperitoneum. Musculoskeletal: Mildly displaced posterior left ninth rib fracture. Gluteal subcutaneous contusion on the left without pelvic ring fracture or diastasis. Chronic left L5 pars defect with trace anterolisthesis. Focal L1-2 degenerative disc narrowing. IMPRESSION: 1. Mildly displaced left ninth rib fracture. 2. Left gluteal contusion. 3. No evidence of intra-abdominal injury. Electronically Signed   By: Monte Fantasia M.D.   On: 09/07/2015 18:15     Assessment & Plan:  Plan  I am having Mr. Tigue start on sertraline. I am also having  him maintain his aspirin EC, clopidogrel, aluminum hydroxide-magnesium carbonate, simvastatin, lisinopril, levothyroxine, atenolol, pantoprazole, HYDROcodone-acetaminophen, lidocaine, traMADol, and spironolactone.  Meds ordered this encounter  Medications  . sertraline (ZOLOFT) 50 MG tablet    Sig: Take 1 tablet (50 mg total) by mouth daily.    Dispense:  30 tablet    Refill:  3    Problem List Items Addressed This Visit    None    Visit Diagnoses    Depression    -  Primary   Relevant Medications   sertraline (ZOLOFT) 50 MG tablet    pt was given numbers and names of counselors - [facility] is coming to the house next week    Follow-up: Return in about 4 weeks (around 12/02/2015), or depression.  Ann Held, DO

## 2015-11-16 ENCOUNTER — Other Ambulatory Visit: Payer: BLUE CROSS/BLUE SHIELD | Admitting: *Deleted

## 2015-11-16 DIAGNOSIS — E875 Hyperkalemia: Secondary | ICD-10-CM

## 2015-11-16 LAB — BASIC METABOLIC PANEL
BUN: 20 mg/dL (ref 7–25)
CALCIUM: 9.1 mg/dL (ref 8.6–10.3)
CHLORIDE: 105 mmol/L (ref 98–110)
CO2: 29 mmol/L (ref 20–31)
CREATININE: 1.01 mg/dL (ref 0.70–1.25)
GLUCOSE: 117 mg/dL — AB (ref 65–99)
POTASSIUM: 4.8 mmol/L (ref 3.5–5.3)
SODIUM: 139 mmol/L (ref 135–146)

## 2015-11-20 ENCOUNTER — Other Ambulatory Visit: Payer: Self-pay | Admitting: Family Medicine

## 2015-11-20 ENCOUNTER — Other Ambulatory Visit: Payer: Self-pay | Admitting: Student

## 2015-11-22 ENCOUNTER — Telehealth: Payer: Self-pay

## 2015-11-22 NOTE — Telephone Encounter (Signed)
Pt is aware of labs results. Pt is agreeable to current medication therapy.  Pt asked if he could come off of his plavix. I instructed the patient that Dr. Caryl Comes did not mention starting or stopping any medications and that he should take all of his current medication as prescribed per the last OV 6/21.  Pt is agreeable and understood. He stated he will continue to take his plavix once daily.

## 2015-11-25 ENCOUNTER — Other Ambulatory Visit: Payer: Self-pay | Admitting: Family Medicine

## 2015-11-25 ENCOUNTER — Other Ambulatory Visit: Payer: Self-pay | Admitting: Student

## 2015-11-25 NOTE — Telephone Encounter (Signed)
Relation to pt: self Call back number:[phone] Pharmacy: [facility] [place], [place] [phone] (Phone) [phone] (Fax)     Reason for call:  Patient requesting a refill levothyroxine (SYNTHROID, LEVOTHROID) 50 MCG tablet

## 2015-12-06 ENCOUNTER — Ambulatory Visit: Payer: BLUE CROSS/BLUE SHIELD | Admitting: Family Medicine

## 2015-12-21 ENCOUNTER — Encounter: Payer: Self-pay | Admitting: Family Medicine

## 2015-12-21 ENCOUNTER — Ambulatory Visit (INDEPENDENT_AMBULATORY_CARE_PROVIDER_SITE_OTHER): Payer: BLUE CROSS/BLUE SHIELD | Admitting: Family Medicine

## 2015-12-21 ENCOUNTER — Ambulatory Visit: Payer: BLUE CROSS/BLUE SHIELD

## 2015-12-21 VITALS — BP 102/58 | HR 53 | Temp 98.0°F | Resp 16 | Ht 76.0 in | Wt 183.6 lb

## 2015-12-21 DIAGNOSIS — Z23 Encounter for immunization: Secondary | ICD-10-CM | POA: Diagnosis not present

## 2015-12-21 DIAGNOSIS — E039 Hypothyroidism, unspecified: Secondary | ICD-10-CM | POA: Diagnosis not present

## 2015-12-21 DIAGNOSIS — F329 Major depressive disorder, single episode, unspecified: Secondary | ICD-10-CM

## 2015-12-21 DIAGNOSIS — I2510 Atherosclerotic heart disease of native coronary artery without angina pectoris: Secondary | ICD-10-CM

## 2015-12-21 DIAGNOSIS — E785 Hyperlipidemia, unspecified: Secondary | ICD-10-CM | POA: Diagnosis not present

## 2015-12-21 DIAGNOSIS — I10 Essential (primary) hypertension: Secondary | ICD-10-CM | POA: Diagnosis not present

## 2015-12-21 DIAGNOSIS — F32A Depression, unspecified: Secondary | ICD-10-CM

## 2015-12-21 DIAGNOSIS — K219 Gastro-esophageal reflux disease without esophagitis: Secondary | ICD-10-CM

## 2015-12-21 NOTE — Progress Notes (Signed)
Patient ID: Jeremy Walker, male    DOB: [birth year]  Age: 65 y.o. MRN: [id number]    Subjective:  Subjective  HPI Jeremy Walker presents for f/u bp and thyroid.    Review of Systems  Constitutional: Negative for appetite change, diaphoresis, fatigue and unexpected weight change.  Eyes: Negative for pain, redness and visual disturbance.  Respiratory: Negative for cough, chest tightness, shortness of breath and wheezing.   Cardiovascular: Negative for chest pain, palpitations and leg swelling.  Endocrine: Negative for cold intolerance, heat intolerance, polydipsia, polyphagia and polyuria.  Genitourinary: Negative for difficulty urinating, dysuria and frequency.  Neurological: Negative for dizziness, light-headedness, numbness and headaches.    History Past Medical History:  Diagnosis Date  . AICD (automatic cardioverter/defibrillator) present    Abdominal implant was 0064 Guidant lead 6/5 mm  . Anxiety   . Atrial fibrillation ([place])   . CAD (coronary artery disease) 03/2015   a. DESx2 to mid and distal RCA  . Childhood asthma   . Depression   . GERD (gastroesophageal reflux disease)   . History of hiatal hernia   . HLD (hyperlipidemia)   . Hypokalemia    Recurrent associated with cardiac arrest  . Hypothyroidism   . Sudden cardiac death ([place]) 1994   aborted  . Thyroid disease   . Tobacco abuse    02-16-14 past hx. heavy smoker, quit < 74yr.    He has a past surgical history that includes Cardiac defibrillator placement (1991); Hand surgery (Left, 1965); EUS (N/A, 02/26/2014); Cardiac catheterization (N/A, 04/08/2015); and Cardiac catheterization (N/A, 04/08/2015).   His family history includes Cancer in his father; Diabetes in his brother, maternal aunt, and mother; Heart failure in his mother; Lung cancer in his brother; Ovarian cancer in his mother; Parkinson's disease in his mother; Scoliosis in his mother.He reports that he quit smoking about 2 years ago. His smoking use  included Cigarettes. He has a 63.00 pack-year smoking history. He has never used smokeless tobacco. He reports that he does not drink alcohol or use drugs.  Current Outpatient Prescriptions on File Prior to Visit  Medication Sig Dispense Refill  . aspirin 81 MG EC tablet TAKE 1 TABLET(81 MG) BY MOUTH DAILY 90 tablet 3   No current facility-administered medications on file prior to visit.      Objective:  Objective  Physical Exam  Constitutional: He is oriented to person, place, and time. Vital signs are normal. He appears well-developed and well-nourished. He is sleeping.  HENT:  Head: Normocephalic and atraumatic.  Mouth/Throat: Oropharynx is clear and moist.  Eyes: EOM are normal. Pupils are equal, round, and reactive to light.  Neck: Normal range of motion. Neck supple. No thyromegaly present.  Cardiovascular: Normal rate and regular rhythm.   No murmur heard. Pulmonary/Chest: Effort normal and breath sounds normal. No respiratory distress. He has no wheezes. He has no rales. He exhibits no tenderness.  Musculoskeletal: He exhibits no edema or tenderness.  Neurological: He is alert and oriented to person, place, and time.  Skin: Skin is warm and dry.  Psychiatric: He has a normal mood and affect. His behavior is normal. Judgment and thought content normal.  Nursing note and vitals reviewed.  BP (!) 102/58 (BP Location: Left Arm, Patient Position: Sitting, Cuff Size: Normal)   Pulse (!) 53   Temp 98 F (36.7 C) (Oral)   Resp 16   Ht 6\' 4"  (1.93 m)   Wt 183 lb 9.6 oz (83.3 kg)   SpO2 97%  BMI 22.35 kg/m  Wt Readings from Last 3 Encounters:  12/21/15 183 lb 9.6 oz (83.3 kg)  11/04/15 183 lb 12.8 oz (83.4 kg)  09/29/15 187 lb (84.8 kg)     Lab Results  Component Value Date   WBC 5.9 09/07/2015   HGB 13.9 09/07/2015   HCT 41.3 09/07/2015   PLT 132 (L) 09/07/2015   GLUCOSE 117 (H) 11/16/2015   CHOL 127 08/05/2015   TRIG 66.0 08/05/2015   HDL 40.40 08/05/2015    LDLCALC 73 08/05/2015   ALT 22 09/07/2015   AST 23 09/07/2015   NA 139 11/16/2015   K 4.8 11/16/2015   CL 105 11/16/2015   CREATININE 1.01 11/16/2015   BUN 20 11/16/2015   CO2 29 11/16/2015   TSH 2.80 05/06/2015   PSA 0.98 05/06/2015   INR 0.96 09/07/2015    Ct Abdomen Pelvis W Contrast  Result Date: 09/07/2015 CLINICAL DATA:  Fall from counter with left back pain. Initial encounter. EXAM: CT ABDOMEN AND PELVIS WITH CONTRAST TECHNIQUE: Multidetector CT imaging of the abdomen and pelvis was performed using the standard protocol following bolus administration of intravenous contrast. CONTRAST:  14mL ISOVUE-300 IOPAMIDOL (ISOVUE-300) INJECTION 61% COMPARISON:  None. FINDINGS: Lower chest and abdominal wall:  Subsegmental basilar atelectasis. Extensive atherosclerotic calcifications seen in the right coronary circulation. ICD/pacer lead into the right ventricle. Small sliding hiatal hernia. Hepatobiliary: No focal liver abnormality.No evidence of biliary obstruction or stone. Pancreas: Unremarkable. Spleen: Chronic granulomatous changes. Adrenals/Urinary Tract: Negative adrenals. No hydronephrosis or stone. Unremarkable bladder. Reproductive:No pathologic findings. Stomach/Bowel:  No obstruction. No appendicitis. Vascular/Lymphatic: No acute vascular abnormality. Aortic and branch vessel atherosclerotic calcification. No mass or adenopathy. Peritoneal: No ascites or pneumoperitoneum. Musculoskeletal: Mildly displaced posterior left ninth rib fracture. Gluteal subcutaneous contusion on the left without pelvic ring fracture or diastasis. Chronic left L5 pars defect with trace anterolisthesis. Focal L1-2 degenerative disc narrowing. IMPRESSION: 1. Mildly displaced left ninth rib fracture. 2. Left gluteal contusion. 3. No evidence of intra-abdominal injury. Electronically Signed   By: Monte Fantasia M.D.   On: 09/07/2015 18:15     Assessment & Plan:  Plan  I have discontinued Mr. Kunka's aluminum  hydroxide-magnesium carbonate, pantoprazole, HYDROcodone-acetaminophen, lidocaine, traMADol, and pantoprazole. I have also changed his atenolol. Additionally, I am having him start on pantoprazole. Lastly, I am having him maintain his aspirin, spironolactone, simvastatin, sertraline, lisinopril, levothyroxine, and clopidogrel.  Meds ordered this encounter  Medications  . spironolactone (ALDACTONE) 25 MG tablet    Sig: Take 0.5 tablets (12.5 mg total) by mouth daily.    Dispense:  45 tablet    Refill:  3  . simvastatin (ZOCOR) 40 MG tablet    Sig: Take 1 tablet (40 mg total) by mouth daily at 6 PM.    Dispense:  90 tablet    Refill:  3  . sertraline (ZOLOFT) 50 MG tablet    Sig: Take 1 tablet (50 mg total) by mouth daily.    Dispense:  30 tablet    Refill:  3  . DISCONTD: pantoprazole (PROTONIX) 40 MG tablet    Sig: Take 1 tablet (40 mg total) by mouth 2 (two) times daily.    Dispense:  30 tablet    Refill:  5  . lisinopril (PRINIVIL,ZESTRIL) 2.5 MG tablet    Sig: TAKE 1 TABLET(2.5 MG) BY MOUTH DAILY    Dispense:  30 tablet    Refill:  9  . levothyroxine (SYNTHROID, LEVOTHROID) 50 MCG tablet  Sig: TAKE 1 TABLET(50 MCG) BY MOUTH DAILY    Dispense:  30 tablet    Refill:  0  . clopidogrel (PLAVIX) 75 MG tablet    Sig: TAKE 1 TABLET(75 MG) BY MOUTH DAILY WITH BREAKFAST    Dispense:  30 tablet    Refill:  10  . atenolol (TENORMIN) 50 MG tablet    Sig: Take 1 tablet (50 mg total) by mouth daily.    Dispense:  90 tablet    Refill:  3  . pantoprazole (PROTONIX) 40 MG tablet    Sig: Take 1 tablet (40 mg total) by mouth daily.    Dispense:  30 tablet    Refill:  3    Problem List Items Addressed This Visit      Unprioritized   GERD (gastroesophageal reflux disease)   Relevant Medications   pantoprazole (PROTONIX) 40 MG tablet   Hyperlipidemia - Primary   Relevant Medications   spironolactone (ALDACTONE) 25 MG tablet   simvastatin (ZOCOR) 40 MG tablet   lisinopril  (PRINIVIL,ZESTRIL) 2.5 MG tablet   atenolol (TENORMIN) 50 MG tablet   Other Relevant Orders   Lipid panel   Hypothyroidism   Relevant Medications   levothyroxine (SYNTHROID, LEVOTHROID) 50 MCG tablet   atenolol (TENORMIN) 50 MG tablet    Other Visit Diagnoses    Essential hypertension       Relevant Medications   spironolactone (ALDACTONE) 25 MG tablet   simvastatin (ZOCOR) 40 MG tablet   lisinopril (PRINIVIL,ZESTRIL) 2.5 MG tablet   atenolol (TENORMIN) 50 MG tablet   Other Relevant Orders   CBC with Differential/Platelet   Comprehensive metabolic panel   Magnesium   Encounter for immunization       Relevant Medications   sertraline (ZOLOFT) 50 MG tablet   lisinopril (PRINIVIL,ZESTRIL) 2.5 MG tablet   levothyroxine (SYNTHROID, LEVOTHROID) 50 MCG tablet   clopidogrel (PLAVIX) 75 MG tablet   atenolol (TENORMIN) 50 MG tablet   Coronary artery disease involving native coronary artery of native heart without angina pectoris       Relevant Medications   spironolactone (ALDACTONE) 25 MG tablet   simvastatin (ZOCOR) 40 MG tablet   lisinopril (PRINIVIL,ZESTRIL) 2.5 MG tablet   atenolol (TENORMIN) 50 MG tablet   Depression       Relevant Medications   sertraline (ZOLOFT) 50 MG tablet   Thalamic infarction ([place])          Follow-up: Return in about 6 months (around 06/19/2016) for hypertension, hyperlipidemia.  Ann Held, DO

## 2015-12-21 NOTE — Progress Notes (Signed)
Pre visit review using our clinic review tool, if applicable. No additional management support is needed unless otherwise documented below in the visit note. 

## 2015-12-21 NOTE — Patient Instructions (Signed)
Hypertension Hypertension, commonly called high blood pressure, is when the force of blood pumping through your arteries is too strong. Your arteries are the blood vessels that carry blood from your heart throughout your body. A blood pressure reading consists of a higher number over a lower number, such as 110/72. The higher number (systolic) is the pressure inside your arteries when your heart pumps. The lower number (diastolic) is the pressure inside your arteries when your heart relaxes. Ideally you want your blood pressure below 120/80. Hypertension forces your heart to work harder to pump blood. Your arteries may become narrow or stiff. Having untreated or uncontrolled hypertension can cause heart attack, stroke, kidney disease, and other problems. RISK FACTORS Some risk factors for high blood pressure are controllable. Others are not.  Risk factors you cannot control include:   Race. You may be at higher risk if you are African American.  Age. Risk increases with age.  Gender. Men are at higher risk than women before age 45 years. After age 65, women are at higher risk than men. Risk factors you can control include:  Not getting enough exercise or physical activity.  Being overweight.  Getting too much fat, sugar, calories, or salt in your diet.  Drinking too much alcohol. SIGNS AND SYMPTOMS Hypertension does not usually cause signs or symptoms. Extremely high blood pressure (hypertensive crisis) may cause headache, anxiety, shortness of breath, and nosebleed. DIAGNOSIS To check if you have hypertension, your health care provider will measure your blood pressure while you are seated, with your arm held at the level of your heart. It should be measured at least twice using the same arm. Certain conditions can cause a difference in blood pressure between your right and left arms. A blood pressure reading that is higher than normal on one occasion does not mean that you need treatment. If  it is not clear whether you have high blood pressure, you may be asked to return on a different day to have your blood pressure checked again. Or, you may be asked to monitor your blood pressure at home for 1 or more weeks. TREATMENT Treating high blood pressure includes making lifestyle changes and possibly taking medicine. Living a healthy lifestyle can help lower high blood pressure. You may need to change some of your habits. Lifestyle changes may include:  Following the DASH diet. This diet is high in fruits, vegetables, and whole grains. It is low in salt, red meat, and added sugars.  Keep your sodium intake below 2,300 mg per day.  Getting at least 30-45 minutes of aerobic exercise at least 4 times per week.  Losing weight if necessary.  Not smoking.  Limiting alcoholic beverages.  Learning ways to reduce stress. Your health care provider may prescribe medicine if lifestyle changes are not enough to get your blood pressure under control, and if one of the following is true:  You are 18-59 years of age and your systolic blood pressure is above 140.  You are 60 years of age or older, and your systolic blood pressure is above 150.  Your diastolic blood pressure is above 90.  You have diabetes, and your systolic blood pressure is over 140 or your diastolic blood pressure is over 90.  You have kidney disease and your blood pressure is above 140/90.  You have heart disease and your blood pressure is above 140/90. Your personal target blood pressure may vary depending on your medical conditions, your age, and other factors. HOME CARE INSTRUCTIONS    Have your blood pressure rechecked as directed by your health care provider.   Take medicines only as directed by your health care provider. Follow the directions carefully. Blood pressure medicines must be taken as prescribed. The medicine does not work as well when you skip doses. Skipping doses also puts you at risk for  problems.  Do not smoke.   Monitor your blood pressure at home as directed by your health care provider. SEEK MEDICAL CARE IF:   You think you are having a reaction to medicines taken.  You have recurrent headaches or feel dizzy.  You have swelling in your ankles.  You have trouble with your vision. SEEK IMMEDIATE MEDICAL CARE IF:  You develop a severe headache or confusion.  You have unusual weakness, numbness, or feel faint.  You have severe chest or abdominal pain.  You vomit repeatedly.  You have trouble breathing. MAKE SURE YOU:   Understand these instructions.  Will watch your condition.  Will get help right away if you are not doing well or get worse.   This information is not intended to replace advice given to you by your health care provider. Make sure you discuss any questions you have with your health care provider.   Document Released: 03/27/2005 Document Revised: 08/11/2014 Document Reviewed: 01/17/2013 Elsevier Interactive Patient Education 2016 [employer]  

## 2015-12-23 MED ORDER — ATENOLOL 50 MG PO TABS: 50.0000 mg | ORAL_TABLET | Freq: Every day | ORAL | 3 refills | Status: DC

## 2015-12-23 MED ORDER — LISINOPRIL 2.5 MG PO TABS: ORAL_TABLET | ORAL | 9 refills | Status: DC

## 2015-12-23 MED ORDER — SPIRONOLACTONE 25 MG PO TABS: 12.5000 mg | ORAL_TABLET | Freq: Every day | ORAL | 3 refills | Status: DC

## 2015-12-23 MED ORDER — PANTOPRAZOLE SODIUM 40 MG PO TBEC: 40.0000 mg | DELAYED_RELEASE_TABLET | Freq: Two times a day (BID) | ORAL | 5 refills | Status: DC

## 2015-12-23 MED ORDER — SERTRALINE HCL 50 MG PO TABS
50.0000 mg | ORAL_TABLET | Freq: Every day | ORAL | 3 refills | Status: DC
Start: 2015-12-23 — End: 2016-07-25

## 2015-12-23 MED ORDER — PANTOPRAZOLE SODIUM 40 MG PO TBEC: 40.0000 mg | DELAYED_RELEASE_TABLET | Freq: Every day | ORAL | 3 refills | Status: DC

## 2015-12-23 MED ORDER — SIMVASTATIN 40 MG PO TABS: 40.0000 mg | ORAL_TABLET | Freq: Every day | ORAL | 3 refills | Status: DC

## 2015-12-23 MED ORDER — CLOPIDOGREL BISULFATE 75 MG PO TABS: ORAL_TABLET | ORAL | 10 refills | Status: DC

## 2015-12-23 MED ORDER — LEVOTHYROXINE SODIUM 50 MCG PO TABS: ORAL_TABLET | ORAL | 0 refills | Status: DC

## 2015-12-24 ENCOUNTER — Other Ambulatory Visit: Payer: Self-pay | Admitting: Family Medicine

## 2016-01-22 ENCOUNTER — Other Ambulatory Visit: Payer: Self-pay | Admitting: Family Medicine

## 2016-01-26 ENCOUNTER — Telehealth: Payer: Self-pay | Admitting: Family Medicine

## 2016-01-26 MED ORDER — LEVOTHYROXINE SODIUM 50 MCG PO TABS: 50.0000 ug | ORAL_TABLET | Freq: Every day | ORAL | 0 refills | Status: DC

## 2016-01-26 NOTE — Telephone Encounter (Signed)
Levothyroxine Sodium 50 MCG  patient called requesting refill be sent to [facility] let patient know provider is out of the office for the remainder of the week but I will send to covering provider.   Call back # [phone]

## 2016-01-26 NOTE — Telephone Encounter (Signed)
I sent in one month of med to his pharmacy. Some time since last thyroid test level. After this refill will defer further refills and testing to pcp.

## 2016-01-27 ENCOUNTER — Other Ambulatory Visit: Payer: Self-pay

## 2016-01-27 MED ORDER — LEVOTHYROXINE SODIUM 50 MCG PO TABS: 50.0000 ug | ORAL_TABLET | Freq: Every day | ORAL | 4 refills | Status: DC

## 2016-02-02 ENCOUNTER — Encounter: Payer: Self-pay | Admitting: Physician Assistant

## 2016-03-06 ENCOUNTER — Encounter: Payer: Self-pay | Admitting: Nurse Practitioner

## 2016-03-07 ENCOUNTER — Telehealth: Payer: Self-pay | Admitting: *Deleted

## 2016-03-07 ENCOUNTER — Encounter (INDEPENDENT_AMBULATORY_CARE_PROVIDER_SITE_OTHER): Payer: Self-pay

## 2016-03-07 ENCOUNTER — Encounter: Payer: Self-pay | Admitting: Physician Assistant

## 2016-03-07 ENCOUNTER — Ambulatory Visit (INDEPENDENT_AMBULATORY_CARE_PROVIDER_SITE_OTHER): Payer: BLUE CROSS/BLUE SHIELD | Admitting: Physician Assistant

## 2016-03-07 VITALS — BP 138/70 | HR 50 | Ht 72.0 in | Wt 186.8 lb

## 2016-03-07 DIAGNOSIS — R42 Dizziness and giddiness: Secondary | ICD-10-CM

## 2016-03-07 DIAGNOSIS — E7800 Pure hypercholesterolemia, unspecified: Secondary | ICD-10-CM | POA: Diagnosis not present

## 2016-03-07 DIAGNOSIS — M79605 Pain in left leg: Secondary | ICD-10-CM

## 2016-03-07 DIAGNOSIS — Z95810 Presence of automatic (implantable) cardiac defibrillator: Secondary | ICD-10-CM

## 2016-03-07 DIAGNOSIS — I5042 Chronic combined systolic (congestive) and diastolic (congestive) heart failure: Secondary | ICD-10-CM | POA: Insufficient documentation

## 2016-03-07 DIAGNOSIS — M79604 Pain in right leg: Secondary | ICD-10-CM

## 2016-03-07 DIAGNOSIS — I2510 Atherosclerotic heart disease of native coronary artery without angina pectoris: Secondary | ICD-10-CM | POA: Diagnosis not present

## 2016-03-07 LAB — BASIC METABOLIC PANEL
BUN: 20 mg/dL (ref 7–25)
CHLORIDE: 104 mmol/L (ref 98–110)
CO2: 28 mmol/L (ref 20–31)
CREATININE: 0.86 mg/dL (ref 0.70–1.25)
Calcium: 9.3 mg/dL (ref 8.6–10.3)
Glucose, Bld: 112 mg/dL — ABNORMAL HIGH (ref 65–99)
Potassium: 4.7 mmol/L (ref 3.5–5.3)
Sodium: 140 mmol/L (ref 135–146)

## 2016-03-07 LAB — CBC
HEMATOCRIT: 42.7 % (ref 38.5–50.0)
Hemoglobin: 14 g/dL (ref 13.2–17.1)
MCH: 31.8 pg (ref 27.0–33.0)
MCHC: 32.8 g/dL (ref 32.0–36.0)
MCV: 97 fL (ref 80.0–100.0)
MPV: 11.6 fL (ref 7.5–12.5)
PLATELETS: 150 10*3/uL (ref 140–400)
RBC: 4.4 MIL/uL (ref 4.20–5.80)
RDW: 13.2 % (ref 11.0–15.0)
WBC: 5 10*3/uL (ref 3.8–10.8)

## 2016-03-07 LAB — MAGNESIUM: MAGNESIUM: 2.1 mg/dL (ref 1.5–2.5)

## 2016-03-07 MED ORDER — ATENOLOL 25 MG PO TABS: 25.0000 mg | ORAL_TABLET | Freq: Every day | ORAL | 3 refills | Status: DC

## 2016-03-07 MED ORDER — CLOPIDOGREL BISULFATE 75 MG PO TABS: 75.0000 mg | ORAL_TABLET | Freq: Every day | ORAL | 3 refills | Status: DC

## 2016-03-07 MED ORDER — LISINOPRIL 2.5 MG PO TABS: 2.5000 mg | ORAL_TABLET | Freq: Every evening | ORAL | 3 refills | Status: DC

## 2016-03-07 NOTE — Patient Instructions (Addendum)
Medication Instructions:  1. DECREASE ATENOLOL TO 25 MG ONCE A DAY 2. CHANGE TAKING LISINOPRIL 2.5 MG IN THE EVENING 3. RESUME YOUR PLAVIX 75 MG DAILY  Labwork: 1. TODAY BMET, MAGNESIUM LEVEL, CBC  Testing/Procedures: 1. Your physician has requested that you have an echocardiogram. Echocardiography is a painless test that uses sound waves to create images of your heart. It provides your doctor with information about the size and shape of your heart and how well your heart's chambers and valves are working. This procedure takes approximately one hour. There are no restrictions for this procedure. 2. Your physician has requested that you have a lower extremity arterial duplex. THIS IS TO BE DONE WITH ABI'S   Follow-Up: DR. Caryl Comes IN 3-4 WEEKS   Any Other Special Instructions Will Be Listed Below (If Applicable).  If you need a refill on your cardiac medications before your next appointment, please call your pharmacy.Marland Kitchen

## 2016-03-07 NOTE — Progress Notes (Signed)
Cardiology Office Note:    Date:  03/07/2016   ID:  Jeremy Walker, DOB 1951/01/24, MRN [id number]  PCP:  Ann Held, DO  Cardiologist/Electrophysiologist:  Dr. Virl Axe   Referring MD: Carollee Herter, Alferd Apa, *   Chief Complaint  Patient presents with  . Dizziness   History of Present Illness:    Jeremy Walker is a 65 y.o. male with a hx of remote cardiac arrest in the setting of profound hypokalemia status post AICD, CAD, HL, prior tobacco abuse.  In 12/16, he complained of dyspnea and a nuclear stress test demonstrated inferior and inferolateral ischemia, normal LV function. Cardiac catheterization demonstrated mid RCA occlusion which was treated with Promus DES 2. There was concern for possible thrombus in the LV apex on left ventriculogram. Echocardiogram was obtained and demonstrated EF 40-45%. There was no evidence of thrombus.  He was last seen by Dr. Virl Axe in 6/17.    He presents today with complaints of dizziness. This has been for the last several months. He has leg cramps at times. Left leg is worse than the right. He denies exertional limb pain. He is convinced that he has peripheral arterial disease. He does note dyspnea with more moderate to extreme activities. However, he denies shortness of breath reminiscent of his previous angina. He denies chest discomfort. He denies orthopnea, PND or edema. He denies syncope. He denies any bleeding issues.  Prior CV studies that were reviewed today include:    Echo 04/08/15 Mild LVH, EF 40-45%, no RWMA, Gr 1 DD, mild to mod MR, mild to mod LAE   LHC 04/08/15 LAD ok LCx ok RCA mid 100% EF 65% PCI: 2.75 x 32 mm and 2.25 x 16 mm Promus Premier DES to RCA   Myoview 03/26/15 Myocardial perfusion is abnormal. Findings consistent with ischemia. Ischemia in the inferior/inferolateral wall (base, mid) This is an intermediate risk study. Overall left ventricular systolic function was normal. LV cavity size is normal.  Nuclear stress EF: 57%.   Past Medical History:  Diagnosis Date  . AICD (automatic cardioverter/defibrillator) present    Abdominal implant was 0064 Guidant lead 6/5 mm  . Anxiety   . Atrial fibrillation ([place])   . CAD (coronary artery disease) 03/2015   a. DESx2 to mid and distal RCA  . Childhood asthma   . Depression   . GERD (gastroesophageal reflux disease)   . History of hiatal hernia   . HLD (hyperlipidemia)   . Hypokalemia    Recurrent associated with cardiac arrest  . Hypothyroidism   . Sudden cardiac death ([place]) 1994   aborted  . Thyroid disease   . Tobacco abuse    02-16-14 past hx. heavy smoker, quit < 46yr.    Past Surgical History:  Procedure Laterality Date  . CARDIAC CATHETERIZATION N/A 04/08/2015   Procedure: Left Heart Cath and Coronary Angiography;  Surgeon: Burnell Blanks, MD;  Location: [facility];  Service: Cardiovascular;  Laterality: N/A;  . CARDIAC CATHETERIZATION N/A 04/08/2015   Procedure: Coronary Stent Intervention;  Surgeon: Burnell Blanks, MD;  Location: [facility];  Service: Cardiovascular;  Laterality: N/A;  . CARDIAC DEFIBRILLATOR PLACEMENT  1991   guidant  . EUS N/A 02/26/2014   Procedure: ESOPHAGEAL ENDOSCOPIC ULTRASOUND (EUS) RADIAL;  Surgeon: Milus Banister, MD;  Location: [facility];  Service: Endoscopy;  Laterality: N/A;  radial linear  . HAND SURGERY Left 1965   "got it caught in a conveyor belt"  Current Medications: Current Meds  Medication Sig  . aspirin 81 MG EC tablet TAKE 1 TABLET(81 MG) BY MOUTH DAILY  . levothyroxine (SYNTHROID, LEVOTHROID) 50 MCG tablet Take 1 tablet (50 mcg total) by mouth daily.  . pantoprazole (PROTONIX) 40 MG tablet Take 40 mg by mouth daily.  . sertraline (ZOLOFT) 50 MG tablet Take 1 tablet (50 mg total) by mouth daily.  . simvastatin (ZOCOR) 40 MG tablet Take 1 tablet (40 mg total) by mouth daily at 6 PM.  . spironolactone (ALDACTONE) 25 MG tablet Take 0.5 tablets  (12.5 mg total) by mouth daily.  . [DISCONTINUED] atenolol (TENORMIN) 50 MG tablet Take 1 tablet (50 mg total) by mouth daily.  . [DISCONTINUED] clopidogrel (PLAVIX) 75 MG tablet TAKE 1/2 TABLET BY MOUTH DAILY WITH BREAKFAST  . [DISCONTINUED] lisinopril (PRINIVIL,ZESTRIL) 2.5 MG tablet TAKE 1 TABLET(2.5 MG) BY MOUTH DAILY     Allergies:   Patient has no known allergies.   Social History   Social History  . Marital status: Married    Spouse name: N/A  . Number of children: 1  . Years of education: N/A   Occupational History  . retired    Social History Main Topics  . Smoking status: Former Smoker    Packs/day: 1.50    Years: 42.00    Types: Cigarettes    Quit date: 02/04/2013  . Smokeless tobacco: Never Used  . Alcohol use No     Comment: "stopped drinking in 01/2013"  . Drug use: No     Comment: "none since 1971"  . Sexual activity: Not Currently   Other Topics Concern  . None   Social History Narrative  . None     Family History:  The patient's family history includes Cancer in his father; Diabetes in his brother, maternal aunt, and mother; Heart failure in his mother; Lung cancer in his brother; Ovarian cancer in his mother; Parkinson's disease in his mother; Scoliosis in his mother.   ROS:   Please see the history of present illness.    Review of Systems  Eyes: Positive for visual disturbance.  Cardiovascular: Positive for dyspnea on exertion.  Hematologic/Lymphatic: Positive for bleeding problem. Bruises/bleeds easily.  Musculoskeletal: Positive for myalgias.  Neurological: Positive for dizziness and loss of balance.  Psychiatric/Behavioral: Positive for depression.   All other systems reviewed and are negative.   EKGs/Labs/Other Test Reviewed:    EKG:  EKG is  ordered today.  The ekg ordered today demonstrates sinus bradycardia, HR 50, LAD, borderline LVH, j point elevation, QTc 412 ms, no change from prior tracing  Recent Labs: 05/06/2015: TSH  2.80 09/07/2015: ALT 22 03/07/2016: BUN 20; Creat 0.86; Hemoglobin 14.0; Magnesium 2.1; Platelets 150; Potassium 4.7; Sodium 140   Recent Lipid Panel    Component Value Date/Time   CHOL 127 08/05/2015 0820   TRIG 66.0 08/05/2015 0820   HDL 40.40 08/05/2015 0820   CHOLHDL 3 08/05/2015 0820   VLDL 13.2 08/05/2015 0820   LDLCALC 73 08/05/2015 0820     Physical Exam:    VS:  BP 138/70   Pulse (!) 50   Ht 6' (1.829 m)   Wt 186 lb 12.8 oz (84.7 kg)   BMI 25.33 kg/m     Wt Readings from Last 3 Encounters:  03/07/16 186 lb 12.8 oz (84.7 kg)  12/21/15 183 lb 9.6 oz (83.3 kg)  11/04/15 183 lb 12.8 oz (83.4 kg)     Physical Exam  Constitutional: He is oriented to  person, place, and time. He appears well-developed and well-nourished. No distress.  HENT:  Head: Normocephalic and atraumatic.  Eyes: No scleral icterus.  Neck: No JVD present. Carotid bruit is not present.  Cardiovascular: Normal rate, regular rhythm and normal heart sounds.   No murmur heard. Pulses:      Dorsalis pedis pulses are 2+ on the right side, and 1+ on the left side.       Posterior tibial pulses are 2+ on the right side, and 1+ on the left side.  Pulmonary/Chest: Effort normal. He has no wheezes. He has no rales.  Abdominal: Soft. There is no tenderness. There is no rebound.  Musculoskeletal: He exhibits no edema.  Neurological: He is alert and oriented to person, place, and time.  Skin: Skin is warm and dry.  Psychiatric: He has a normal mood and affect.    ASSESSMENT:    1. Dizziness   2. Coronary artery disease involving native coronary artery of native heart without angina pectoris   3. Chronic combined systolic and diastolic CHF (congestive heart failure) ([place])   4. Pure hypercholesterolemia   5. Implantable defibrillator-[employer]   6. Pain in both lower extremities    PLAN:    In order of problems listed above:  1. Dizziness - Etiology not clear.  His HR is in the 50s.  Question  if symptoms related to bradycardia.  I did get device interrogated today.  He is convinced dizziness is related to his Lisinopril.  But, his BP is not low.  -  ICD interr today:  He is pacing <1%; HRs mainly in the 50s (lowest 40s); no therapies delivered  -  Decrease Atenolol to 25 QD  -  Check CBC, BMET, Mg2+  -  Change Lisinopril to QPM  -  Obtain follow up echocardiogram   2. CAD - s/p PCI with DES x 2 to RCA in 12/16.  No symptoms of recurrent angina.  He decreased Plavix on his own to 1/2 tab QD.  I have asked him to resume Plavix 75 QD.  Can consider DC Plavix 1 year after PCI.  -  Continue ASA, Plavix, beta-blocker, statin.  3. Chronic combined systolic and diastolic CHF - 2/2 to Ischemic CM with EF 40-45 at time of PCI in 12/16. [id number].  Continue beta-blocker, ACE inhibitor. Check echocardiogram.  4. HL - LDL in 4/17 was 73. Continue statin.   5. S/p ICD - Hx of VF in setting of hypokalemia. Device interrogated.  No therapies delivered. Check BMET today.   6. Leg pain - He really does not have claudication. But, he is concerned about PAD.  His DP/PT is diminished on the L.  Get ABIs.    Medication Adjustments/Labs and Tests Ordered: Current medicines are reviewed at length with the patient today.  Concerns regarding medicines are outlined above.  Medication changes, Labs and Tests ordered today are outlined in the Patient Instructions noted below. Patient Instructions  Medication Instructions:  1. DECREASE ATENOLOL TO 25 MG ONCE A DAY 2. CHANGE TAKING LISINOPRIL 2.5 MG IN THE EVENING 3. RESUME YOUR PLAVIX 75 MG DAILY  Labwork: 1. TODAY BMET, MAGNESIUM LEVEL, CBC  Testing/Procedures: 1. Your physician has requested that you have an echocardiogram. Echocardiography is a painless test that uses sound waves to create images of your heart. It provides your doctor with information about the size and shape of your heart and how well your heart's chambers and valves are working. This  procedure takes approximately  one hour. There are no restrictions for this procedure. 2. Your physician has requested that you have a lower extremity arterial duplex. THIS IS TO BE DONE WITH ABI'S   Follow-Up: DR. Caryl Comes IN 3-4 WEEKS   Any Other Special Instructions Will Be Listed Below (If Applicable).  If you need a refill on your cardiac medications before your next appointment, please call your pharmacy.Weston Brass, Richardson Dopp, PA-C  03/07/2016 4:27 PM    [facility] [address], [place], [place]  [address] Phone: [phone]; Fax: [phone]

## 2016-03-07 NOTE — Telephone Encounter (Signed)
Lmtcb to go over lab results 

## 2016-03-08 NOTE — Telephone Encounter (Signed)
F/u message ° °Pt returning RN call. Please call back to discuss  °

## 2016-03-08 NOTE — Telephone Encounter (Signed)
Pt notified of lab results by phone with verbal understanding. Pt confirmed where he had to go for the LEA ultrasound, pt said he thought he had to go to [facility] since it said [facility], even though it had [employer] in the address, pt said he was confused. I explained to him where he will go for the test; that he will go to [facility] where [employer] is and take the elevator to the 2nd floor, come off the elevator and look for [place] on the left. Pt thanked me for the help.

## 2016-03-14 ENCOUNTER — Telehealth: Payer: Self-pay | Admitting: Family Medicine

## 2016-03-14 DIAGNOSIS — R2681 Unsteadiness on feet: Secondary | ICD-10-CM

## 2016-03-14 NOTE — Telephone Encounter (Signed)
Patient called stating that he would like to see a neurologist. He states that his mother had Parkinson's disease and he thinks he may be getting the disease too. He states that he is starting to become unsteady on his feet. Patient claims that Dr. Etter Sjogren- Cheri Rous evaluated him once and thought he was fine but asked if he wanted to see a specialist and at the time he did not. He does now. Please advise.   Patient phone: [phone]

## 2016-03-15 NOTE — Telephone Encounter (Signed)
Patient checking on the status of message below, informed patient PCP is out of the office and nurse will follow up.

## 2016-03-15 NOTE — Telephone Encounter (Signed)
Neurology referral entered, per pt's request. Pt notified. LB

## 2016-03-17 ENCOUNTER — Emergency Department (HOSPITAL_COMMUNITY)
Admission: EM | Admit: 2016-03-17 | Discharge: 2016-03-17 | Disposition: A | Discharge status: Home / Self Care | Payer: BLUE CROSS/BLUE SHIELD | Attending: Emergency Medicine | Admitting: Emergency Medicine

## 2016-03-17 ENCOUNTER — Encounter (HOSPITAL_COMMUNITY): Payer: Self-pay | Admitting: *Deleted

## 2016-03-17 DIAGNOSIS — Z87891 Personal history of nicotine dependence: Secondary | ICD-10-CM | POA: Diagnosis not present

## 2016-03-17 DIAGNOSIS — E039 Hypothyroidism, unspecified: Secondary | ICD-10-CM | POA: Insufficient documentation

## 2016-03-17 DIAGNOSIS — Z7982 Long term (current) use of aspirin: Secondary | ICD-10-CM | POA: Insufficient documentation

## 2016-03-17 DIAGNOSIS — R42 Dizziness and giddiness: Secondary | ICD-10-CM | POA: Diagnosis not present

## 2016-03-17 DIAGNOSIS — I5042 Chronic combined systolic (congestive) and diastolic (congestive) heart failure: Secondary | ICD-10-CM | POA: Insufficient documentation

## 2016-03-17 DIAGNOSIS — I2510 Atherosclerotic heart disease of native coronary artery without angina pectoris: Secondary | ICD-10-CM | POA: Insufficient documentation

## 2016-03-17 DIAGNOSIS — Z95810 Presence of automatic (implantable) cardiac defibrillator: Secondary | ICD-10-CM | POA: Insufficient documentation

## 2016-03-17 NOTE — ED Triage Notes (Signed)
EMS reports pt has had light headedness for months, has been seen several times by PMD with med adjustments. Pt poor historian.

## 2016-03-17 NOTE — Discharge Instructions (Signed)
Follow-up with neurology as previously scheduled.

## 2016-03-17 NOTE — ED Provider Notes (Signed)
[facility] DEPT Provider Note    By signing my name below, I, Bea Graff, attest that this documentation has been prepared under the direction and in the presence of Virgel Manifold, MD. Electronically Signed: Bea Graff, ED Scribe. 03/17/16. 10:00 AM.   History   Chief Complaint Chief Complaint  Patient presents with  . Light headed   The history is provided by the patient and medical records. No language interpreter was used.    HPI Comments:  Jeremy Walker is a 65 y.o. male with PMHx of CAD, atrial fibrillation, HLD and anxiety brought in by EMS who presents to the Emergency Department complaining of light headedness that has been ongoing for the past two years. He reports associated worsening photophobia, dizziness and states he is forgetting things more frequently. He reports some tremors in his hands on occasion. Pt has not taken anything for his symptoms. He denies modifying factors. He denies any pain, tinnitus. He has an appt with a neurologist in one month, 04/14/16. Pt is currently on Plavix. He reports being under a tremendous amount of stress lately.    Past Medical History:  Diagnosis Date  . AICD (automatic cardioverter/defibrillator) present    Abdominal implant was 0064 Guidant lead 6/5 mm  . Anxiety   . Atrial fibrillation ([place])   . CAD (coronary artery disease) 03/2015   a. DESx2 to mid and distal RCA  . Childhood asthma   . Depression   . GERD (gastroesophageal reflux disease)   . History of hiatal hernia   . HLD (hyperlipidemia)   . Hypokalemia    Recurrent associated with cardiac arrest  . Hypothyroidism   . Sudden cardiac death ([place]) 1994   aborted  . Thyroid disease   . Tobacco abuse    02-16-14 past hx. heavy smoker, quit < 60yr.    Patient Active Problem List   Diagnosis Date Noted  . Coronary artery disease involving native heart without angina pectoris 03/07/2016  . Chronic combined systolic and diastolic CHF (congestive heart  failure) ([place]) 03/07/2016  . Chest pain 09/14/2015  . Rib fracture 09/12/2015  . Abrasion of right arm 09/12/2015  . Foot swelling, Left [place]  12/15/2014  . Discoloration of skin 12/15/2014  . PVD (peripheral vascular disease) with claudication ([place]) 11/20/2014  . Hyperlipidemia 10/13/2014  . Hypothyroidism 10/13/2014  . Leg cramps 10/13/2014  . Physical exam 04/13/2014  . Duodenal diverticulum 02/26/2014  . Bile duct abnormality 02/26/2014  . Special screening for malignant neoplasms, colon 01/30/2014  . Belching 01/30/2014  . Elevated LFTs 01/30/2014  . Flatulence/gas pain/belching 12/04/2013  . Abdominal distension 12/04/2013  . GERD (gastroesophageal reflux disease) 10/08/2013  . Ventricular fibrillation ([place]) 07/26/2010  . Implantable defibrillator-[employer] 07/26/2010  . TOBACCO ABUSE 06/08/2009    Past Surgical History:  Procedure Laterality Date  . CARDIAC CATHETERIZATION N/A 04/08/2015   Procedure: Left Heart Cath and Coronary Angiography;  Surgeon: Burnell Blanks, MD;  Location: [facility];  Service: Cardiovascular;  Laterality: N/A;  . CARDIAC CATHETERIZATION N/A 04/08/2015   Procedure: Coronary Stent Intervention;  Surgeon: Burnell Blanks, MD;  Location: [facility];  Service: Cardiovascular;  Laterality: N/A;  . CARDIAC DEFIBRILLATOR PLACEMENT  1991   guidant  . EUS N/A 02/26/2014   Procedure: ESOPHAGEAL ENDOSCOPIC ULTRASOUND (EUS) RADIAL;  Surgeon: Milus Banister, MD;  Location: [facility];  Service: Endoscopy;  Laterality: N/A;  radial linear  . HAND SURGERY Left 1965   "got it caught in a  conveyor belt"       Home Medications    Prior to Admission medications   Medication Sig Start Date End Date Taking? Authorizing Provider  aspirin 81 MG EC tablet TAKE 1 TABLET(81 MG) BY MOUTH DAILY 11/22/15   Alferd Apa Lowne Chase, DO  atenolol (TENORMIN) 25 MG tablet Take 1 tablet (25 mg total) by mouth daily. 03/07/16 06/05/16   Liliane Shi, PA-C  clopidogrel (PLAVIX) 75 MG tablet Take 1 tablet (75 mg total) by mouth daily. 03/07/16   Liliane Shi, PA-C  levothyroxine (SYNTHROID, LEVOTHROID) 50 MCG tablet Take 1 tablet (50 mcg total) by mouth daily. 01/27/16   Rosalita Chessman Chase, DO  lisinopril (PRINIVIL,ZESTRIL) 2.5 MG tablet Take 1 tablet (2.5 mg total) by mouth every evening. 03/07/16 06/05/16  Liliane Shi, PA-C  pantoprazole (PROTONIX) 40 MG tablet Take 40 mg by mouth daily.    Historical Provider, MD  sertraline (ZOLOFT) 50 MG tablet Take 1 tablet (50 mg total) by mouth daily. 12/23/15   Rosalita Chessman Chase, DO  simvastatin (ZOCOR) 40 MG tablet Take 1 tablet (40 mg total) by mouth daily at 6 PM. 12/23/15   Rosalita Chessman Chase, DO  spironolactone (ALDACTONE) 25 MG tablet Take 0.5 tablets (12.5 mg total) by mouth daily. 12/23/15   Ann Held, DO    Family History Family History  Problem Relation Age of Onset  . Lung cancer Brother     smoker  . Cancer Father     bladder or kidney, mets  . Parkinson's disease Mother   . Scoliosis Mother   . Ovarian cancer Mother   . Diabetes Mother   . Heart failure Mother     CHF  . Diabetes Brother   . Diabetes Maternal Aunt     Social History Social History  Substance Use Topics  . Smoking status: Former Smoker    Packs/day: 1.50    Years: 42.00    Types: Cigarettes    Quit date: 02/04/2013  . Smokeless tobacco: Never Used  . Alcohol use No     Comment: "stopped drinking in 01/2013"     Allergies   Patient has no known allergies.   Review of Systems Review of Systems A complete 10 system review of systems was obtained and all systems are negative except as noted in the HPI and PMH.    Physical Exam Updated Vital Signs BP 117/73   Pulse 63   Temp 98.9 F (37.2 C)   Resp 16   SpO2 93%   Physical Exam  Constitutional: He is oriented to person, place, and time. He appears well-developed and well-nourished.  HENT:  Head:  Normocephalic.  Eyes: EOM are normal.  Neck: Normal range of motion.  Cardiovascular: Normal rate, regular rhythm and normal heart sounds.  Exam reveals no gallop and no friction rub.   No murmur heard. Pulmonary/Chest: Effort normal and breath sounds normal. No respiratory distress. He has no wheezes. He has no rales.  Abdominal: He exhibits no distension.  Musculoskeletal: Normal range of motion.  Neurological: He is alert and oriented to person, place, and time.  No pronator drift. Finger nose finger normal bilaterally. Normal appearing tandem gait. Mild resting tremor of left hand. Intention tremor or BUE.  Psychiatric: He has a normal mood and affect.  Nursing note and vitals reviewed.    ED Treatments / Results  DIAGNOSTIC STUDIES: Oxygen Saturation is 93% on RA, low by my interpretation.  COORDINATION OF CARE: 9:49 AM- Will order labs and EKG. Pt verbalizes understanding and agrees to plan.  9:59 AM- Pt refused EKG. Will discharge with instructions to follow up with neurologist as already scheduled.  Medications - No data to display  Labs (all labs ordered are listed, but only abnormal results are displayed) Labs Reviewed - No data to display  EKG  EKG Interpretation None       Radiology No results found.  Procedures Procedures (including critical care time)  Medications Ordered in ED Medications - No data to display   Initial Impression / Assessment and Plan / ED Course  I have reviewed the triage vital signs and the nursing notes.  Pertinent labs & imaging results that were available during my care of the patient were reviewed by me and considered in my medical decision making (see chart for details).  Clinical Course      I personally preformed the services scribed in my presence. The recorded information has been reviewed is accurate. Virgel Manifold, MD.   Final Clinical Impressions(s) / ED Diagnoses   Final diagnoses:  Lightheadedness     New Prescriptions New Prescriptions   No medications on file     Virgel Manifold, MD 03/21/16 2106

## 2016-03-17 NOTE — ED Notes (Signed)
Pt does not want an EKG. Pt states that his heart is fine its his head that is the problem.

## 2016-03-17 NOTE — ED Notes (Signed)
Bed: [id number] Expected date:  Expected time:  Means of arrival:  Comments: EMS 65 yo LIGHTHEADED

## 2016-03-17 NOTE — ED Notes (Signed)
Delay on EKG doctor is in the room at this time talking with the pt.

## 2016-03-20 ENCOUNTER — Encounter: Payer: BLUE CROSS/BLUE SHIELD | Admitting: Physician Assistant

## 2016-03-22 ENCOUNTER — Other Ambulatory Visit: Payer: Self-pay | Admitting: Physician Assistant

## 2016-03-22 DIAGNOSIS — M79605 Pain in left leg: Principal | ICD-10-CM

## 2016-03-22 DIAGNOSIS — M79604 Pain in right leg: Secondary | ICD-10-CM

## 2016-03-27 ENCOUNTER — Ambulatory Visit (HOSPITAL_COMMUNITY)
Admission: RE | Admit: 2016-03-27 | Discharge: 2016-03-27 | Disposition: A | Discharge status: Home / Self Care | Payer: BLUE CROSS/BLUE SHIELD | Source: Ambulatory Visit | Attending: Physician Assistant | Admitting: Physician Assistant

## 2016-03-27 ENCOUNTER — Encounter (HOSPITAL_COMMUNITY): Payer: BLUE CROSS/BLUE SHIELD

## 2016-03-27 DIAGNOSIS — I2510 Atherosclerotic heart disease of native coronary artery without angina pectoris: Secondary | ICD-10-CM | POA: Insufficient documentation

## 2016-03-27 DIAGNOSIS — M79604 Pain in right leg: Secondary | ICD-10-CM | POA: Diagnosis not present

## 2016-03-27 DIAGNOSIS — M79605 Pain in left leg: Secondary | ICD-10-CM | POA: Insufficient documentation

## 2016-03-27 DIAGNOSIS — E785 Hyperlipidemia, unspecified: Secondary | ICD-10-CM | POA: Insufficient documentation

## 2016-03-27 DIAGNOSIS — Z720 Tobacco use: Secondary | ICD-10-CM | POA: Insufficient documentation

## 2016-03-27 DIAGNOSIS — I739 Peripheral vascular disease, unspecified: Secondary | ICD-10-CM | POA: Diagnosis not present

## 2016-03-28 ENCOUNTER — Other Ambulatory Visit: Payer: Self-pay

## 2016-03-28 ENCOUNTER — Encounter: Payer: Self-pay | Admitting: Internal Medicine

## 2016-03-28 ENCOUNTER — Telehealth: Payer: Self-pay | Admitting: *Deleted

## 2016-03-28 ENCOUNTER — Ambulatory Visit (HOSPITAL_COMMUNITY): Payer: BLUE CROSS/BLUE SHIELD | Attending: Cardiovascular Disease

## 2016-03-28 ENCOUNTER — Ambulatory Visit (INDEPENDENT_AMBULATORY_CARE_PROVIDER_SITE_OTHER): Payer: BLUE CROSS/BLUE SHIELD | Admitting: Internal Medicine

## 2016-03-28 VITALS — BP 122/70 | HR 65 | Ht 72.0 in | Wt 184.2 lb

## 2016-03-28 DIAGNOSIS — Z95810 Presence of automatic (implantable) cardiac defibrillator: Secondary | ICD-10-CM | POA: Diagnosis not present

## 2016-03-28 DIAGNOSIS — I469 Cardiac arrest, cause unspecified: Secondary | ICD-10-CM

## 2016-03-28 DIAGNOSIS — Z87891 Personal history of nicotine dependence: Secondary | ICD-10-CM | POA: Diagnosis not present

## 2016-03-28 DIAGNOSIS — I255 Ischemic cardiomyopathy: Secondary | ICD-10-CM | POA: Diagnosis not present

## 2016-03-28 DIAGNOSIS — J45909 Unspecified asthma, uncomplicated: Secondary | ICD-10-CM | POA: Insufficient documentation

## 2016-03-28 DIAGNOSIS — I5042 Chronic combined systolic (congestive) and diastolic (congestive) heart failure: Secondary | ICD-10-CM | POA: Diagnosis present

## 2016-03-28 DIAGNOSIS — I4891 Unspecified atrial fibrillation: Secondary | ICD-10-CM | POA: Diagnosis not present

## 2016-03-28 DIAGNOSIS — I2510 Atherosclerotic heart disease of native coronary artery without angina pectoris: Secondary | ICD-10-CM | POA: Diagnosis not present

## 2016-03-28 DIAGNOSIS — Z8249 Family history of ischemic heart disease and other diseases of the circulatory system: Secondary | ICD-10-CM | POA: Diagnosis not present

## 2016-03-28 LAB — ECHOCARDIOGRAM COMPLETE
HEIGHTINCHES: 72 in
WEIGHTICAEL: 2947.2 [oz_av]
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
## 2016-03-28 NOTE — Patient Instructions (Signed)
Medication Instructions: - Your physician has recommended you make the following change in your medication:  1) Stop lisinopril  Labwork: - none ordered  Procedures/Testing: - none ordered  Follow-Up: - Your physician wants you to follow-up in: 6 months with Tommye Standard, PA for Dr. Caryl Comes. You will receive a reminder letter in the mail two months in advance. If you don't receive a letter, please call our office to schedule the follow-up appointment.  Any Additional Special Instructions Will Be Listed Below (If Applicable).     If you need a refill on your cardiac medications before your next appointment, please call your pharmacy.

## 2016-03-28 NOTE — Telephone Encounter (Signed)
Pt notified of ABI results are normal. Pt verbalized understanding to the results.

## 2016-03-29 ENCOUNTER — Encounter: Payer: Self-pay | Admitting: Physician Assistant

## 2016-03-29 ENCOUNTER — Telehealth: Payer: Self-pay | Admitting: *Deleted

## 2016-03-29 LAB — CUP PACEART INCLINIC DEVICE CHECK
Date Time Interrogation Session: 20171219050000
HIGH POWER IMPEDANCE MEASURED VALUE: 48 Ohm
HighPow Impedance: 69 Ohm
Lead Channel Impedance Value: 349 Ohm
Lead Channel Setting Pacing Pulse Width: 1 ms
MDC IDC LEAD IMPLANT DT: 19940211
MDC IDC LEAD LOCATION: 753860
MDC IDC LEAD SERIAL: 5495
MDC IDC MSMT LEADCHNL RV PACING THRESHOLD AMPLITUDE: 1.7 V
MDC IDC MSMT LEADCHNL RV PACING THRESHOLD PULSEWIDTH: 1 ms
MDC IDC MSMT LEADCHNL RV SENSING INTR AMPL: 8.1 mV
MDC IDC PG IMPLANT DT: 20120705
MDC IDC PG SERIAL: 115351
MDC IDC SET LEADCHNL RV PACING AMPLITUDE: 3 V
MDC IDC SET LEADCHNL RV SENSING SENSITIVITY: 0.4 mV

## 2016-03-29 NOTE — Telephone Encounter (Signed)
Pt notified of echo results by phone with verbal understanding. I will send a copy of results to PCP.

## 2016-04-11 ENCOUNTER — Encounter: Payer: BLUE CROSS/BLUE SHIELD | Admitting: Physician Assistant
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
## 2016-04-14 ENCOUNTER — Ambulatory Visit (INDEPENDENT_AMBULATORY_CARE_PROVIDER_SITE_OTHER): Payer: BLUE CROSS/BLUE SHIELD | Admitting: Neurology

## 2016-04-14 ENCOUNTER — Encounter: Payer: Self-pay | Admitting: Neurology

## 2016-04-14 ENCOUNTER — Other Ambulatory Visit: Payer: BLUE CROSS/BLUE SHIELD

## 2016-04-14 VITALS — BP 112/72 | HR 63 | Ht 72.0 in | Wt 189.0 lb

## 2016-04-14 DIAGNOSIS — R251 Tremor, unspecified: Secondary | ICD-10-CM | POA: Diagnosis not present

## 2016-04-14 DIAGNOSIS — R739 Hyperglycemia, unspecified: Secondary | ICD-10-CM

## 2016-04-14 DIAGNOSIS — R292 Abnormal reflex: Secondary | ICD-10-CM

## 2016-04-14 DIAGNOSIS — G609 Hereditary and idiopathic neuropathy, unspecified: Secondary | ICD-10-CM

## 2016-04-14 DIAGNOSIS — G20 Parkinson's disease: Secondary | ICD-10-CM

## 2016-04-14 LAB — TSH: TSH: 2.39 m[IU]/L (ref 0.40–4.50)

## 2016-04-14 LAB — VITAMIN B12: Vitamin B-12: 409 pg/mL (ref 200–1100)

## 2016-04-14 LAB — FOLATE: FOLATE: 21.6 ng/mL (ref >5.4)

## 2016-04-14 LAB — HEMOGLOBIN A1C
HEMOGLOBIN A1C: 5.9 % — AB (ref <5.7)
Mean Plasma Glucose: 123 mg/dL

## 2016-04-14 MED ORDER — PRAMIPEXOLE DIHYDROCHLORIDE 0.5 MG PO TABS: 0.5000 mg | ORAL_TABLET | Freq: Three times a day (TID) | ORAL | 2 refills | Status: DC

## 2016-04-14 MED ORDER — PRAMIPEXOLE DIHYDROCHLORIDE 0.125 MG PO TABS: ORAL_TABLET | ORAL | 0 refills | Status: DC

## 2016-04-14 NOTE — Patient Instructions (Addendum)
1. Your provider has requested that you have labwork completed today. Please go to [facility] ([id number]) on the second floor of this building before leaving the office today. You do not need to check in. If you are not called within 15 minutes please check with the front desk.   2. You have been referred to [facility]. They will call you directly to schedule an appointment.  Please call [phone] if you do not hear from them.   3. We have scheduled you at [facility] for your MRI on 04/19/16 at 1:10 pm. Please arrive 30 minutes prior and go to [facility] - 1st floor. If you need to change this appt please call [phone].  4. Start mirapex (pramipexole) as follows:  0.125 mg - 1 tablet three times per day for a week, then 2 tablets three times per day for a week and then fill the 0.5 mg tablet and take that, 1 pill three times per day Both prescriptions have been sent to your pharmacy

## 2016-04-15 LAB — RPR

## 2016-04-18 LAB — PROTEIN ELECTROPHORESIS,RANDOM URN
CREATININE, URINE: 228 mg/dL (ref 20–370)
Protein Creatinine Ratio: 66 mg/g creat (ref 22–128)
TOTAL PROTEIN, URINE: 15 mg/dL (ref 5–25)

## 2016-04-18 LAB — IMMUNOFIXATION ELECTROPHORESIS
IgA: 156 mg/dL (ref 81–463)
IgG (Immunoglobin G), Serum: 846 mg/dL (ref 694–1618)
IgM, Serum: 65 mg/dL (ref 48–271)

## 2016-04-18 LAB — PROTEIN ELECTROPHORESIS, SERUM
ALBUMIN ELP: 4.4 g/dL (ref 3.8–4.8)
ALPHA-1-GLOBULIN: 0.3 g/dL (ref 0.2–0.3)
ALPHA-2-GLOBULIN: 0.8 g/dL (ref 0.5–0.9)
Beta 2: 0.3 g/dL (ref 0.2–0.5)
Beta Globulin: 0.5 g/dL (ref 0.4–0.6)
Gamma Globulin: 0.8 g/dL (ref 0.8–1.7)
TOTAL PROTEIN, SERUM ELECTROPHOR: 7.2 g/dL (ref 6.1–8.1)

## 2016-04-18 LAB — IMMUNOFIXATION INTE

## 2016-04-19 ENCOUNTER — Ambulatory Visit
Admission: RE | Admit: 2016-04-19 | Discharge: 2016-04-19 | Disposition: A | Discharge status: Home / Self Care | Payer: BLUE CROSS/BLUE SHIELD | Source: Ambulatory Visit | Attending: Neurology | Admitting: Neurology

## 2016-04-19 ENCOUNTER — Telehealth: Payer: Self-pay | Admitting: Neurology

## 2016-04-19 DIAGNOSIS — G20 Parkinson's disease: Secondary | ICD-10-CM

## 2016-04-19 DIAGNOSIS — R251 Tremor, unspecified: Secondary | ICD-10-CM

## 2016-04-19 DIAGNOSIS — R292 Abnormal reflex: Secondary | ICD-10-CM

## 2016-04-19 NOTE — Telephone Encounter (Signed)
Patient made aware CT okay.  

## 2016-04-19 NOTE — Telephone Encounter (Signed)
-----   Message from [place], DO sent at 04/19/2016  8:47 AM EST ----- Labs are good

## 2016-04-19 NOTE — Telephone Encounter (Signed)
Patient made aware that labs okay.

## 2016-04-19 NOTE — Telephone Encounter (Signed)
-----   Message from Cameron Sprang, MD sent at 04/19/2016  3:16 PM EST ----- Pls let patient know head CT is unremarkable, no evidence of tumor, stroke, or bleed. Thanks

## 2016-04-21 ENCOUNTER — Ambulatory Visit: Payer: BLUE CROSS/BLUE SHIELD

## 2016-04-21 ENCOUNTER — Ambulatory Visit: Payer: BLUE CROSS/BLUE SHIELD | Admitting: Physical Therapy

## 2016-05-01 ENCOUNTER — Ambulatory Visit: Payer: BLUE CROSS/BLUE SHIELD | Admitting: Physical Therapy

## 2016-05-01 ENCOUNTER — Ambulatory Visit: Payer: BLUE CROSS/BLUE SHIELD | Attending: Neurology | Admitting: Speech Pathology

## 2016-05-01 ENCOUNTER — Encounter: Payer: Self-pay | Admitting: Speech Pathology

## 2016-05-01 DIAGNOSIS — R2689 Other abnormalities of gait and mobility: Secondary | ICD-10-CM

## 2016-05-01 DIAGNOSIS — R293 Abnormal posture: Secondary | ICD-10-CM | POA: Diagnosis present

## 2016-05-01 DIAGNOSIS — R2681 Unsteadiness on feet: Secondary | ICD-10-CM

## 2016-05-01 DIAGNOSIS — R41841 Cognitive communication deficit: Secondary | ICD-10-CM

## 2016-05-01 DIAGNOSIS — R471 Dysarthria and anarthria: Secondary | ICD-10-CM | POA: Diagnosis not present

## 2016-05-01 NOTE — Therapy (Signed)
[facility] [address] [place], [place], [address] Phone: [phone]   Fax:  [phone]  Speech Language Pathology Evaluation  Jeremy Walker Details  Name: Jeremy Walker MRN: [id number] Date of Birth: 05-23-1950 Referring Provider: Ann Held, DO   Encounter Date: 05/01/2016      End of Session - 05/01/16 1332    Visit Number 1   Number of Visits 17   Date for SLP Re-Evaluation 07/03/16   SLP Start Time 1147   SLP Stop Time  1233   SLP Time Calculation (min) 46 min   Activity Tolerance Jeremy Walker tolerated treatment well      Past Medical History:  Diagnosis Date  . AICD (automatic cardioverter/defibrillator) present    Abdominal implant was 0064 Guidant lead 6/5 mm  . Anxiety   . Atrial fibrillation ([place])   . CAD (coronary artery disease) 03/2015   a. DESx2 to mid and distal RCA  . Childhood asthma   . Chronic combined systolic and diastolic CHF (congestive heart failure) ([place])    a. ischemic CM - EF 40-45 in 12/16 // b. Echo 12/17: Echo 12/17: EF 50-55, normal wall motion, grade 1 diastolic dysfunction  . Depression   . GERD (gastroesophageal reflux disease)   . History of hiatal hernia   . HLD (hyperlipidemia)   . Hypokalemia    Recurrent associated with cardiac arrest  . Hypothyroidism   . Leg pain    ABIs 12/17: normal   . Sudden cardiac death ([place]) 1994   aborted  . Thyroid disease   . Tobacco abuse    02-16-14 past hx. heavy smoker, quit < 32yr.    Past Surgical History:  Procedure Laterality Date  . CARDIAC CATHETERIZATION N/A 04/08/2015   Procedure: Left Heart Cath and Coronary Angiography;  Surgeon: Burnell Blanks, MD;  Location: [facility];  Service: Cardiovascular;  Laterality: N/A;  . CARDIAC CATHETERIZATION N/A 04/08/2015   Procedure: Coronary Stent Intervention;  Surgeon: Burnell Blanks, MD;  Location: [facility];  Service: Cardiovascular;   Laterality: N/A;  . CARDIAC DEFIBRILLATOR PLACEMENT  1991   guidant  . EUS N/A 02/26/2014   Procedure: ESOPHAGEAL ENDOSCOPIC ULTRASOUND (EUS) RADIAL;  Surgeon: Milus Banister, MD;  Location: [facility];  Service: Endoscopy;  Laterality: N/A;  radial linear  . HAND SURGERY Left 1965   "got it caught in a conveyor belt"    There were no vitals filed for this visit.      Subjective Assessment - 05/01/16 1254    Subjective "I've always been soft-spoken"   Currently in Pain? No/denies            SLP Evaluation [facility] - 05/01/16 1254      SLP Visit Information   SLP Received On 05/01/16   Referring Provider Ann Held, DO    Onset Date 04/14/2016   Medical Diagnosis Parkinson's Disease     Subjective   Jeremy Walker/Family Stated Goal "I guess I need to talk louder"     General Information   HPI Jeremy Walker is a 66 year old male with past medical history notable for  GERD, Barrett's esophagus, hiatal hernia, CAD, CHF, depression, anxiety, atrial fibrillation, thyroid, past tobacco abuse (quit 02-16-14), who was recently diagnosed with Parkinson's Disease. He complains of tremor in L hand and occasionally left leg. Referred for speech evaluation.   Behavioral/Cognition Alert, oriented x4, pleasant mood   Mobility Status ambulated independently to session  Prior Functional Status   Cognitive/Linguistic Baseline Within functional limits   Type of Home House    Lives With Alone   Education 2 years college   Vocation Retired     Associate Professor   Overall Cognitive Status Impaired/Different from baseline   Area of Impairment Memory   Memory Comments delayed recall 2/5     Auditory Comprehension   Overall Auditory Comprehension Appears within functional limits for tasks assessed     Visual Recognition/Discrimination   Discrimination Within Function Limits     Reading Comprehension   Reading Status Not tested  reports difficulty with reading comprehension     Expression    Primary Mode of Expression Verbal     Verbal Expression   Overall Verbal Expression Impaired   Initiation No impairment   Other Verbal Expression Comments generative naming, sentence repetition mild impairment     Written Expression   Dominant Hand Right   Written Expression Not tested     Oral Motor/Sensory Function   Overall Oral Motor/Sensory Function Appears within functional limits for tasks assessed   Overall Oral Motor/Sensory Function --  noted mild pooling of oral secretions     Motor Speech   Overall Motor Speech Impaired   Respiration Impaired   Level of Impairment Conversation   Phonation Normal   Resonance Within functional limits   Articulation Impaired   Level of Impairment Conversation   Intelligibility Intelligibility reduced   Conversation 75-100% accurate   Motor Planning Witnin functional limits   Interfering Components Inadequate dentition   Effective Techniques Increased vocal intensity;Over-articulate   Phonation WFL     Standardized Assessments   Standardized Assessments  Montreal Cognitive Assessment (MOCA)   Montreal Cognitive Assessment (MOCA)  24/30, indicating mild cognitive impairment (26 or above is WNL)                         SLP Education - 05/01/16 1331    Education provided Yes   Education Details vocal loudness/effort to improve speech intelligibility   Person(s) Educated Jeremy Walker   Methods Explanation;Other (comment);Verbal cues;Demonstration  audio feedback   Comprehension Verbalized understanding;Returned demonstration;Verbal cues required;Need further instruction          SLP Short Term Goals - 05/01/16 1338      SLP SHORT TERM GOAL #1   Title pt will produce loud /a/ with average 85db over 4 sessions   Time 4   Period Weeks   Status New     SLP SHORT TERM GOAL #2   Title Pt will demonstrate average of >70dB during structured sentence level speech tasks with rare min A over 2 sessions   Time 4    Period Weeks   Status New     SLP SHORT TERM GOAL #3   Title pt will produce speech >70 dB average at the paragraph level with rare min A over 2 sessions   Time 4   Period Weeks   Status New     SLP SHORT TERM GOAL #4   Title pt will generate 8 minutes simple to mod complex conversation at average >70dB with min nonverbal cues, over two sessions   Time 4   Period Weeks   Status New          SLP Long Term Goals - 05/01/16 1351      SLP LONG TERM GOAL #1   Title Pt will average 70dB over 15 minute mildly complex conversation with supervision cues  Time 8   Period Weeks     SLP LONG TERM GOAL #2   Title Pt will converse audibly in noisy environment/outside of therapy room over 15 minutes with supervision cues.   Time 8   Period Weeks          Plan - 04-May-2016 1335    Clinical Impression Statement Jeremy Walker presents with mild-moderate hypokinetic dysarthria and mild cognitive communication impairment secondary to Parkinson's Disease. Jeremy Walker denies difficulty swallowing, however he endorses mild esophageal symptoms including belching, intermittent foreign body sensation at thyroid notch with solids which resolves with liquid wash. Administered Reflux Symptom Index (RSI); Jeremy Walker scored 5 (above 5 is abnormal). With regard to cognitive communication, Jeremy Walker states he feels he is mostly at his cognitive baseline, however: "Sometimes I get very confused," complains of "misplacing stuff," and states "I almost ran some stop signs" recently. Administered MOCA; Jeremy Walker is observed to self-monitor and correct multiple mistakes during testing. Deficits were noted in clock drawing (2/3), delayed recall (2/5) and language (repetition and generative naming) (1/3). Overall score 24/30 indicates mild cognitive impairment. Will provide education regarding activities and compensatory strategies to improve functional cognitive communication. Recommend monitoring cognitive communication status at this  time; Jeremy Walker may benefit from skilled ST in the future should he present with functional decline.  8 minutes of conversational speech was reduced today, at average 59.1dB (WNL= >65 dB average) with range of 50-66dB, when a sound level meter was placed 50 cm away from pt's mouth. Overall intelligibility for this listener in a quiet environment was approx 90%. Production of loud /a/ averaged 63dB (range of 57 to 69). With moderate demonstration and verbal cues for increased loudness/vocal effort, Jeremy Walker stimulable for loud /a/ increasing to average of 83.8 dB (range of 78 to 88). With same level of cues, Jeremy Walker stimulable at paragraph level for average of 71.8 dB. Pt would benefit from skilled ST in order to improve speech intelligibility and pt's QOL.   Speech Therapy Frequency 2x / week   Duration Other (comment)  8 weeks   Treatment/Interventions Cueing hierarchy;SLP instruction and feedback;Compensatory strategies;Jeremy Walker/family education;Functional tasks   Potential to Achieve Goals Good   Potential Considerations Medical prognosis   SLP Home Exercise Plan tasks to improve speech intelligibility as assigned   Consulted and Agree with Plan of Care Jeremy Walker      Jeremy Walker will benefit from skilled therapeutic intervention in order to improve the following deficits and impairments:   Dysarthria and anarthria  Cognitive communication deficit      G-Codes - May 04, 2016 1354    Functional Assessment Tool Used NOMS 5   Functional Limitations Motor speech   Motor Speech Current Status [phone]) At least 20 percent but less than 40 percent impaired, limited or restricted   Motor Speech Goal Status [id number]) At least 1 percent but less than 20 percent impaired, limited or restricted   Motor Speech Goal Status [id number]) At least 1 percent but less than 20 percent impaired, limited or restricted      Problem List Jeremy Walker Active Problem List   Diagnosis Date Noted  . Coronary artery disease involving  native heart without angina pectoris 03/07/2016  . Chronic combined systolic and diastolic CHF (congestive heart failure) ([place]) 03/07/2016  . Chest pain 09/14/2015  . Rib fracture 09/12/2015  . Abrasion of right arm 09/12/2015  . Foot swelling, Left Ankel  12/15/2014  . Discoloration of skin 12/15/2014  . PVD (peripheral vascular disease) with claudication ([place]) 11/20/2014  . Hyperlipidemia 10/13/2014  .  Hypothyroidism 10/13/2014  . Leg cramps 10/13/2014  . Physical exam 04/13/2014  . Duodenal diverticulum 02/26/2014  . Bile duct abnormality 02/26/2014  . Special screening for malignant neoplasms, colon 01/30/2014  . Belching 01/30/2014  . Elevated LFTs 01/30/2014  . Flatulence/gas pain/belching 12/04/2013  . Abdominal distension 12/04/2013  . GERD (gastroesophageal reflux disease) 10/08/2013  . Ventricular fibrillation ([place]) 07/26/2010  . Implantable defibrillator-[employer] 07/26/2010  . TOBACCO ABUSE 06/08/2009   Deneise Lever, MS CF-SLP Speech-Language Pathologist   Aliene Altes 05/01/2016, 4:49 PM  [facility] [address] [place], [place], [address] Phone: [phone]   Fax:  [phone]  Name: Jeremy Walker MRN: [id number] Date of Birth: 1951/04/09

## 2016-05-02 NOTE — Therapy (Signed)
[facility] [address] [place], [place], [address] Phone: [phone]   Fax:  [phone]  Physical Therapy Evaluation  Patient Details  Name: Jeremy Walker MRN: [id number] Date of Birth: 1951/03/09 Referring Provider: Alonza Bogus  Encounter Date: 05/01/2016      PT End of Session - 05/02/16 1215    Visit Number 1   Number of Visits 9   Date for PT Re-Evaluation 06/29/16   Authorization Type [id number] visit limit for PT   Authorization - Visit Number 1   Authorization - Number of Visits 30   PT Start Time 1105   PT Stop Time 1146   PT Time Calculation (min) 41 min   Activity Tolerance Patient tolerated treatment well   Behavior During Therapy [facility] for tasks assessed/performed      Past Medical History:  Diagnosis Date  . AICD (automatic cardioverter/defibrillator) present    Abdominal implant was 0064 Guidant lead 6/5 mm  . Anxiety   . Atrial fibrillation ([place])   . CAD (coronary artery disease) 03/2015   a. DESx2 to mid and distal RCA  . Childhood asthma   . Chronic combined systolic and diastolic CHF (congestive heart failure) ([place])    a. ischemic CM - EF 40-45 in 12/16 // b. Echo 12/17: Echo 12/17: EF 50-55, normal wall motion, grade 1 diastolic dysfunction  . Depression   . GERD (gastroesophageal reflux disease)   . History of hiatal hernia   . HLD (hyperlipidemia)   . Hypokalemia    Recurrent associated with cardiac arrest  . Hypothyroidism   . Leg pain    ABIs 12/17: normal   . Sudden cardiac death ([place]) 1994   aborted  . Thyroid disease   . Tobacco abuse    02-16-14 past hx. heavy smoker, quit < 56yr.    Past Surgical History:  Procedure Laterality Date  . CARDIAC CATHETERIZATION N/A 04/08/2015   Procedure: Left Heart Cath and Coronary Angiography;  Surgeon: Burnell Blanks, MD;  Location: [facility];  Service: Cardiovascular;  Laterality: N/A;  . CARDIAC CATHETERIZATION N/A  04/08/2015   Procedure: Coronary Stent Intervention;  Surgeon: Burnell Blanks, MD;  Location: [facility];  Service: Cardiovascular;  Laterality: N/A;  . CARDIAC DEFIBRILLATOR PLACEMENT  1991   guidant  . EUS N/A 02/26/2014   Procedure: ESOPHAGEAL ENDOSCOPIC ULTRASOUND (EUS) RADIAL;  Surgeon: Milus Banister, MD;  [facility];  Service: Endoscopy;  Laterality: N/A;  radial linear  . HAND SURGERY Left 1965   "got it caught in a conveyor belt"    There were no vitals filed for this visit.       Subjective Assessment - 05/01/16 1111    Subjective Pt reports recent diagnosis of Parkinson's disease, with some lightheadedness, L hand tremors.  Pt reports having one fall in the past 6 months, on a hillside dealing with a limb.  He reports being primary caregiver for his wife, but that she may be needing additional care.   Patient Stated Goals Pt's goal for therapy is to get balance back and be able to walk better.    Currently in Pain? No/denies            [facility] PT Assessment - 05/01/16 1114      Assessment   Medical Diagnosis Parkinson's disease   Referring Provider Tat, Wells Guiles   Onset Date/Surgical Date 04/14/16     Precautions   Precautions Fall   Precaution Comments  cardiac arrest in past with debrillator placement     Balance Screen   Has the patient fallen in the past 6 months Yes   How many times? 1   Has the patient had a decrease in activity level because of a fear of falling?  Yes   Is the patient reluctant to leave their home because of a fear of falling?  No     Home Ecologist residence   Living Arrangements Spouse/significant other  not in good health   Type of [place] to enter   Entrance Stairs-Number of Steps 3   [place] or work area in basement;One level   [place] - single point;Walker - 2 wheels     Prior Function    Level of Independence Independent with household mobility without device;Independent with basic ADLs;Independent with community mobility without device   Vocation Retired   Leisure Enjoys walking in neighborhood     Posture/Postural Control   Posture/Postural Control Postural limitations   Postural Limitations Rounded Shoulders;Forward head  R shoulder lower than L     Tone   Assessment Location Left Lower Extremity     ROM / Strength   AROM / PROM / Strength Strength     Strength   Overall Strength Within functional limits for tasks performed     Transfers   Transfers Sit to Stand;Stand to Sit   Sit to Stand 6: Modified independent (Device/Increase time);Without upper extremity assist;From chair/3-in-1   Five time sit to stand comments  12  does not fully extend knees upon standing   Stand to Sit 6: Modified independent (Device/Increase time);Without upper extremity assist;To chair/3-in-1   Comments Reports needing UE support with sit<>stand from recliner and getting out of car      Ambulation/Gait   Ambulation/Gait Yes   Ambulation/Gait Assistance 5: Supervision   Ambulation Distance (Feet) 200 Feet   Assistive device None   Gait Pattern Step-through pattern;Decreased arm swing - right;Decreased arm swing - left;Decreased stride length;Decreased trunk rotation;Trunk flexed   Ambulation Surface Level;Indoor   Gait velocity 9.53= 3.44 ft/sec     Standardized Balance Assessment   Standardized Balance Assessment Timed Up and Go Test     Timed Up and Go Test   Normal TUG (seconds) 12.79   Manual TUG (seconds) 14.19   Cognitive TUG (seconds) 13.97   TUG Comments >10% time between TUG and TUG manual indicates possible difficulty with dual tasking     High Level Balance   High Level Balance Comments SLS:  LLE 1.5 sec, 1.28 sec; RLE 1.69 sec, 2.12 sec     Functional Gait  Assessment   Gait assessed  Yes   Gait Level Surface Walks 20 ft in less than 7 sec but greater than  5.5 sec, uses assistive device, slower speed, mild gait deviations, or deviates 6-10 in outside of the 12 in walkway width.  6.58   Change in Gait Speed Able to change speed, demonstrates mild gait deviations, deviates 6-10 in outside of the 12 in walkway width, or no gait deviations, unable to achieve a major change in velocity, or uses a change in velocity, or uses an assistive device.   Gait with Horizontal Head Turns Performs head turns with moderate changes in gait velocity, slows down, deviates 10-15 in outside 12 in walkway width but recovers, can continue to walk.   Gait with Vertical  Head Turns Performs task with slight change in gait velocity (eg, minor disruption to smooth gait path), deviates 6 - 10 in outside 12 in walkway width or uses assistive device   Gait and Pivot Turn Pivot turns safely within 3 sec and stops quickly with no loss of balance.   Step Over Obstacle Is able to step over one shoe box (4.5 in total height) but must slow down and adjust steps to clear box safely. May require verbal cueing.   Gait with Narrow Base of Support Ambulates 7-9 steps.   Gait with Eyes Closed Cannot walk 20 ft without assistance, severe gait deviations or imbalance, deviates greater than 15 in outside 12 in walkway width or will not attempt task.   Ambulating Backwards Walks 20 ft, uses assistive device, slower speed, mild gait deviations, deviates 6-10 in outside 12 in walkway width.  10.32   Steps Alternating feet, must use rail.   Total Score 17   FGA comment: Scores <22/30 indicate increased fall risk.     LLE Tone   LLE Tone Mild                                PT Long Term Goals - 05/02/16 1228      PT LONG TERM GOAL #1   Title Pt will be independent with HEP for improved balance, transfers, and gait.  TARGET 05/31/16   Time 4   Period Weeks   Status New     PT LONG TERM GOAL #2   Title Pt will perform sit<>stand transfers from <18" surfaces, 8 of 10  reps, independently, for improved transfer efficiency and safety.   Time 4   Period Weeks   Status New     PT LONG TERM GOAL #3   Title Pt will improve Functional Gait Assessment to at least 20/30 for decreased fall risk.   Time 4   Period Weeks   Status New     PT LONG TERM GOAL #4   Title Pt will improve SLS to at least 3 seconds each leg, for improved stair/obstacle negotiation.   Time 4   Period Weeks   Status New     PT LONG TERM GOAL #5   Title Pt will verbalize understanding of local Parkinson's disease resources, including exercise options for continued community fitness upon D/C from PT.   Time 4   Period Weeks   Status New     Additional Long Term Goals   Additional Long Term Goals Yes     PT LONG TERM GOAL #6   Title Pt will verbalize understanding of fall prevention in the home environment.     Time 4   Period Weeks   Status New               Plan - 05/02/16 1219    Clinical Impression Statement Pt is a 65 year old male who presents to OP PT with new diagnosis of Parkinson's disease.  Pt presents with L UE tremor, posture abnormality, decreased balance, decreased timing and coordination of gait, decreased transfer ability from low/soft surfaces.  Per PMH, pt has at least 3 co-morbidities.  Pt's presentation appears to be evolving, with medications added in past several weeks to address Parkinson's disease.  Pt is at fall risk per Functional Gait assessment; he appears to have difficulty with dual tasking due to TUG and TUG manual scores.  Pt would  benefit from skilled physical therapy to address the above stated deficits to improve functional mobility and decrease fall risk.   Rehab Potential Good   PT Frequency 2x / week   PT Duration 4 weeks  plus eval   PT Treatment/Interventions ADLs/Self Care Home Management;Functional mobility training;Gait training;Therapeutic activities;Therapeutic exercise;Balance training;Neuromuscular re-education;Patient/family  education   PT Next Visit Plan Initiate HEP for PWR! Moves in standing and in sitting; sit<>stand transfers from low surfaces; gait activities; SLS (for stair/obstacle negotiation and dressing)   Consulted and Agree with Plan of Care Patient      Patient will benefit from skilled therapeutic intervention in order to improve the following deficits and impairments:  Abnormal gait, Decreased balance, Decreased mobility, Decreased strength, Difficulty walking, Postural dysfunction  Visit Diagnosis: Other abnormalities of gait and mobility  Unsteadiness on feet  Abnormal posture     Problem List Patient Active Problem List   Diagnosis Date Noted  . Coronary artery disease involving native heart without angina pectoris 03/07/2016  . Chronic combined systolic and diastolic CHF (congestive heart failure) ([place]) 03/07/2016  . Chest pain 09/14/2015  . Rib fracture 09/12/2015  . Abrasion of right arm 09/12/2015  . Foot swelling, Left Ankel  12/15/2014  . Discoloration of skin 12/15/2014  . PVD (peripheral vascular disease) with claudication ([place]) 11/20/2014  . Hyperlipidemia 10/13/2014  . Hypothyroidism 10/13/2014  . Leg cramps 10/13/2014  . Physical exam 04/13/2014  . Duodenal diverticulum 02/26/2014  . Bile duct abnormality 02/26/2014  . Special screening for malignant neoplasms, colon 01/30/2014  . Belching 01/30/2014  . Elevated LFTs 01/30/2014  . Flatulence/gas pain/belching 12/04/2013  . Abdominal distension 12/04/2013  . GERD (gastroesophageal reflux disease) 10/08/2013  . Ventricular fibrillation ([place]) 07/26/2010  . Implantable defibrillator-[employer] 07/26/2010  . TOBACCO ABUSE 06/08/2009    Jeremy Walker W. 05/02/2016, 12:33 PM  Frazier Butt., PT  [facility] [address] [place], [place], [address] Phone: [phone]   Fax:  [phone]  Name: Jeremy Walker MRN: [id number] Date of Birth:  1950/07/20

## 2016-05-08 ENCOUNTER — Ambulatory Visit: Payer: BLUE CROSS/BLUE SHIELD | Admitting: Physical Therapy

## 2016-05-08 ENCOUNTER — Ambulatory Visit: Payer: BLUE CROSS/BLUE SHIELD | Admitting: Speech Pathology

## 2016-05-08 DIAGNOSIS — R2681 Unsteadiness on feet: Secondary | ICD-10-CM

## 2016-05-08 DIAGNOSIS — R471 Dysarthria and anarthria: Secondary | ICD-10-CM | POA: Diagnosis not present

## 2016-05-08 DIAGNOSIS — R2689 Other abnormalities of gait and mobility: Secondary | ICD-10-CM

## 2016-05-08 DIAGNOSIS — R293 Abnormal posture: Secondary | ICD-10-CM

## 2016-05-08 NOTE — Therapy (Signed)
[facility] [address] [place], [place], [address] Phone: [phone]   Fax:  [phone]  Speech Language Pathology Treatment  Patient Details  Name: Jeremy Walker MRN: [id number] Date of Birth: [birth year] Referring Provider: Alonza Bogus, DO  Encounter Date: 05/08/2016      End of Session - 05/08/16 1227    Visit Number 2   Number of Visits 17   Date for SLP Re-Evaluation 07/03/16   SLP Start Time 1013   SLP Stop Time  1059   SLP Time Calculation (min) 46 min   Activity Tolerance Patient tolerated treatment well      Past Medical History:  Diagnosis Date  . AICD (automatic cardioverter/defibrillator) present    Abdominal implant was 0064 Guidant lead 6/5 mm  . Anxiety   . Atrial fibrillation ([place])   . CAD (coronary artery disease) 03/2015   a. DESx2 to mid and distal RCA  . Childhood asthma   . Chronic combined systolic and diastolic CHF (congestive heart failure) ([place])    a. ischemic CM - EF 40-45 in 12/16 // b. Echo 12/17: Echo 12/17: EF 50-55, normal wall motion, grade 1 diastolic dysfunction  . Depression   . GERD (gastroesophageal reflux disease)   . History of hiatal hernia   . HLD (hyperlipidemia)   . Hypokalemia    Recurrent associated with cardiac arrest  . Hypothyroidism   . Leg pain    ABIs 12/17: normal   . Sudden cardiac death ([place]) 1994   aborted  . Thyroid disease   . Tobacco abuse    02-16-14 past hx. heavy smoker, quit < 64yr.    Past Surgical History:  Procedure Laterality Date  . CARDIAC CATHETERIZATION N/A 04/08/2015   Procedure: Left Heart Cath and Coronary Angiography;  Surgeon: Burnell Blanks, MD;  Location: [facility];  Service: Cardiovascular;  Laterality: N/A;  . CARDIAC CATHETERIZATION N/A 04/08/2015   Procedure: Coronary Stent Intervention;  Surgeon: Burnell Blanks, MD;  Location: [facility];  Service: Cardiovascular;  Laterality: N/A;  .  CARDIAC DEFIBRILLATOR PLACEMENT  1991   guidant  . EUS N/A 02/26/2014   Procedure: ESOPHAGEAL ENDOSCOPIC ULTRASOUND (EUS) RADIAL;  Surgeon: Milus Banister, MD;  Location: [facility];  Service: Endoscopy;  Laterality: N/A;  radial linear  . HAND SURGERY Left 1965   "got it caught in a conveyor belt"    There were no vitals filed for this visit.      Subjective Assessment - 05/08/16 1017    Subjective "I've been reading [place] tales out loud."   Currently in Pain? No/denies               ADULT SLP TREATMENT - 05/08/16 0001      General Information   Behavior/Cognition Alert;Cooperative;Pleasant mood     Treatment Provided   Treatment provided Cognitive-Linquistic     Pain Assessment   Pain Assessment No/denies pain     Cognitive-Linquistic Treatment   Treatment focused on Voice;Dysarthria   Skilled Treatment SLP used loud /a/ to recalibrate patient's loudness in conversation. Used pt's perceived effort level of 8 to improve carryover of vocal techniques to hierarchical tasks at phrase, sentence level with increased cognitive load, as well as during conversation. Patient required min verbal and visual cues to increase loudness and benefitted from clinician demonstration and modelling. Homework provided. Sustained /a/ averaged 85.3 dB with min A cues. Sentence level: 72.8 dB, 71.6 dB avg with increased cognitive  load and min verbal and gestural cues. Spontaneous responses averaged 62.2 dB; with verbal and gestural cues for increased effort, patient improved to 69.5 dB in structured conversational tasks. Patient benefitted from demonstration and instruction to improve breath support and pacing.     Assessment / Recommendations / Plan   Plan Continue with current plan of care     Progression Toward Goals   Progression toward goals Progressing toward goals          SLP Education - 05/08/16 1227    Education provided Yes   Education Details vocal loudness/effort,  breath support and pacing   Person(s) Educated Patient   Methods Explanation;Demonstration;Verbal cues   Comprehension Verbalized understanding;Returned demonstration;Verbal cues required;Need further instruction          SLP Short Term Goals - 05/08/16 1232      SLP SHORT TERM GOAL #1   Title pt will produce loud /a/ with average 85db over 4 sessions   Baseline 1.29.18   Time 3   Period Weeks   Status On-going     SLP SHORT TERM GOAL #2   Title Pt will demonstrate average of >70dB during structured sentence level speech tasks with rare min A over 2 sessions   Baseline 1.29.18   Time 3   Period Weeks   Status On-going     SLP SHORT TERM GOAL #3   Title pt will produce speech >70 dB average at the paragraph level with rare min A over 2 sessions   Time 3   Period Weeks   Status On-going     SLP SHORT TERM GOAL #4   Title pt will generate 8 minutes simple to mod complex conversation at average >70dB with min nonverbal cues, over two sessions   Time 3   Period Weeks   Status On-going          SLP Long Term Goals - 05/08/16 1233      SLP LONG TERM GOAL #1   Title Pt will average 70dB over 15 minute mildly complex conversation with supervision cues   Time 7   Period Weeks   Status On-going     SLP LONG TERM GOAL #2   Title Pt will converse audibly in noisy environment/outside of therapy room over 15 minutes with supervision cues.   Time 7   Period Weeks   Status On-going          Plan - 05/08/16 1227    Clinical Impression Statement Patient continues to display mild-moderate hypokinetic dysarthria with reduced vocal amplitude and conversational loudness. He is responsive to verbal cues and instruction to improve loudness and intelligibility, and benefits from audio recording as biofeedback to build awareness of deficits in sensory perceptions of amplitude. Noted to self-monitor loudness at sentence level on several occasions during today's session, saying, "I  need to do that again, don't I?" He will benefit from continued skilled ST intervention to improve independence and carryover of vocal loudness/effort to increased length of utterance and conversation in order to improve intelligibility and overall communicative function.   Speech Therapy Frequency 2x / week   Duration --  8 weeks   Treatment/Interventions Cueing hierarchy;SLP instruction and feedback;Compensatory strategies;Patient/family education;Functional tasks   Potential to Achieve Goals Good   Potential Considerations Medical prognosis   SLP Home Exercise Plan tasks to improve speech intelligibility as assigned   Consulted and Agree with Plan of Care Patient      Patient will benefit from skilled therapeutic intervention  in order to improve the following deficits and impairments:   Dysarthria and anarthria    Problem List Patient Active Problem List   Diagnosis Date Noted  . Coronary artery disease involving native heart without angina pectoris 03/07/2016  . Chronic combined systolic and diastolic CHF (congestive heart failure) ([place]) 03/07/2016  . Chest pain 09/14/2015  . Rib fracture 09/12/2015  . Abrasion of right arm 09/12/2015  . Foot swelling, Left Ankel  12/15/2014  . Discoloration of skin 12/15/2014  . PVD (peripheral vascular disease) with claudication ([place]) 11/20/2014  . Hyperlipidemia 10/13/2014  . Hypothyroidism 10/13/2014  . Leg cramps 10/13/2014  . Physical exam 04/13/2014  . Duodenal diverticulum 02/26/2014  . Bile duct abnormality 02/26/2014  . Special screening for malignant neoplasms, colon 01/30/2014  . Belching 01/30/2014  . Elevated LFTs 01/30/2014  . Flatulence/gas pain/belching 12/04/2013  . Abdominal distension 12/04/2013  . GERD (gastroesophageal reflux disease) 10/08/2013  . Ventricular fibrillation ([facility]) 07/26/2010  . Implantable defibrillator-[employer] 07/26/2010  . TOBACCO ABUSE 06/08/2009   Deneise Lever, [place]  CF-SLP Speech-Language Pathologist   Aliene Altes 05/08/2016, 12:33 PM  [facility] [address] [place], [place], [address] Phone: [phone]   Fax:  [phone]   Name: Renly Debaun MRN: [id number] Date of Birth: [birth year]

## 2016-05-08 NOTE — Therapy (Signed)
[facility] [address] [place], [place], [address] Phone: [phone]   Fax:  [phone]  Physical Therapy Treatment  Patient Details  Name: Jeremy Walker MRN: [id number] Date of Birth: 08-14-1950 Referring Provider: Alonza Bogus  Encounter Date: 05/08/2016      PT End of Session - 05/08/16 1155    Visit Number 2   Number of Visits 9   Date for PT Re-Evaluation 06/29/16   Authorization Type [id number] visit limit for PT   Authorization - Visit Number 1   Authorization - Number of Visits 30   PT Start Time 1105   PT Stop Time 1145   PT Time Calculation (min) 40 min   Activity Tolerance Patient tolerated treatment well   Behavior During Therapy [facility] for tasks assessed/performed      Past Medical History:  Diagnosis Date  . AICD (automatic cardioverter/defibrillator) present    Abdominal implant was 0064 Guidant lead 6/5 mm  . Anxiety   . Atrial fibrillation ([place])   . CAD (coronary artery disease) 03/2015   a. DESx2 to mid and distal RCA  . Childhood asthma   . Chronic combined systolic and diastolic CHF (congestive heart failure) ([place])    a. ischemic CM - EF 40-45 in 12/16 // b. Echo 12/17: Echo 12/17: EF 50-55, normal wall motion, grade 1 diastolic dysfunction  . Depression   . GERD (gastroesophageal reflux disease)   . History of hiatal hernia   . HLD (hyperlipidemia)   . Hypokalemia    Recurrent associated with cardiac arrest  . Hypothyroidism   . Leg pain    ABIs 12/17: normal   . Sudden cardiac death ([place]) 1994   aborted  . Thyroid disease   . Tobacco abuse    02-16-14 past hx. heavy smoker, quit < 50yr.    Past Surgical History:  Procedure Laterality Date  . CARDIAC CATHETERIZATION N/A 04/08/2015   Procedure: Left Heart Cath and Coronary Angiography;  Surgeon: Burnell Blanks, MD;  Location: [facility];  Service: Cardiovascular;  Laterality: N/A;  . CARDIAC CATHETERIZATION N/A  04/08/2015   Procedure: Coronary Stent Intervention;  Surgeon: Burnell Blanks, MD;  Location: [facility];  Service: Cardiovascular;  Laterality: N/A;  . CARDIAC DEFIBRILLATOR PLACEMENT  1991   guidant  . EUS N/A 02/26/2014   Procedure: ESOPHAGEAL ENDOSCOPIC ULTRASOUND (EUS) RADIAL;  Surgeon: Milus Banister, MD;  [facility];  Service: Endoscopy;  Laterality: N/A;  radial linear  . HAND SURGERY Left 1965   "got it caught in a conveyor belt"    There were no vitals filed for this visit.      Subjective Assessment - 05/08/16 1107    Subjective "Getting out of the bath tub is hard for me to do." From lying down postion. Wife is in a rest home now. Walks about 1 mi/day leisurely.                            [facility]) - 05/08/16 1159 Standing   PWR! Up x20   PWR! Rock [employer]! Twist x20   PWR Step x20   Comments Mod cues for sequencing and body mechanics; no UE support needed.             PT Education - 05/08/16 1151    Education provided Yes   Education Details Discussed and gave handout for Fall prevention and  benefits of Alert system or keeping portable phone close by.  Discussed walking routine. Initiated HEP, PWR! Moves in standing.   Person(s) Educated Patient   Methods Explanation;Demonstration;Verbal cues;Handout   Comprehension Verbalized understanding;Returned demonstration;Verbal cues required;Need further instruction             PT Long Term Goals - 05/02/16 1228      PT LONG TERM GOAL #1   Title Pt will be independent with HEP for improved balance, transfers, and gait.  TARGET 05/31/16   Time 4   Period Weeks   Status New     PT LONG TERM GOAL #2   Title Pt will perform sit<>stand transfers from <18" surfaces, 8 of 10 reps, independently, for improved transfer efficiency and safety.   Time 4   Period Weeks   Status New     PT LONG TERM GOAL #3   Title Pt will improve Functional Gait Assessment to at  least 20/30 for decreased fall risk.   Time 4   Period Weeks   Status New     PT LONG TERM GOAL #4   Title Pt will improve SLS to at least 3 seconds each leg, for improved stair/obstacle negotiation.   Time 4   Period Weeks   Status New     PT LONG TERM GOAL #5   Title Pt will verbalize understanding of local Parkinson's disease resources, including exercise options for continued community fitness upon D/C from PT.   Time 4   Period Weeks   Status New     Additional Long Term Goals   Additional Long Term Goals Yes     PT LONG TERM GOAL #6   Title Pt will verbalize understanding of fall prevention in the home environment.     Time 4   Period Weeks   Status New               Plan - 05/08/16 1156    Clinical Impression Statement Initiated HEP, PWR! Moves in standing, pt required cues for technique but no UE support needed.  Gave handout for Fall prevention, discussing options that might help with tub transfer; pt verbalized understanding.   Rehab Potential Good   PT Frequency 2x / week   PT Duration 4 weeks  plus eval   PT Treatment/Interventions ADLs/Self Care Home Management;Functional mobility training;Gait training;Therapeutic activities;Therapeutic exercise;Balance training;Neuromuscular re-education;Patient/family education   PT Next Visit Plan Floor and tub transfer training; Review HEP for PWR! Moves in standing and in sitting; sit<>stand transfers from low surfaces; gait activities; SLS (for stair/obstacle negotiation and dressing)   Consulted and Agree with Plan of Care Patient      Patient will benefit from skilled therapeutic intervention in order to improve the following deficits and impairments:  Abnormal gait, Decreased balance, Decreased mobility, Decreased strength, Difficulty walking, Postural dysfunction  Visit Diagnosis: Other abnormalities of gait and mobility  Unsteadiness on feet  Abnormal posture     Problem List Patient Active Problem  List   Diagnosis Date Noted  . Coronary artery disease involving native heart without angina pectoris 03/07/2016  . Chronic combined systolic and diastolic CHF (congestive heart failure) ([place]) 03/07/2016  . Chest pain 09/14/2015  . Rib fracture 09/12/2015  . Abrasion of right arm 09/12/2015  . Foot swelling, Left Ankel  12/15/2014  . Discoloration of skin 12/15/2014  . PVD (peripheral vascular disease) with claudication ([place]) 11/20/2014  . Hyperlipidemia 10/13/2014  . Hypothyroidism 10/13/2014  . Leg cramps 10/13/2014  .  Physical exam 04/13/2014  . Duodenal diverticulum 02/26/2014  . Bile duct abnormality 02/26/2014  . Special screening for malignant neoplasms, colon 01/30/2014  . Belching 01/30/2014  . Elevated LFTs 01/30/2014  . Flatulence/gas pain/belching 12/04/2013  . Abdominal distension 12/04/2013  . GERD (gastroesophageal reflux disease) 10/08/2013  . Ventricular fibrillation ([place]) 07/26/2010  . Implantable defibrillator-[employer] 07/26/2010  . TOBACCO ABUSE 06/08/2009    Bjorn Loser, PTA  05/08/16, 12:01 PM [facility] [address] [place], [place], [address] Phone: [phone]   Fax:  [phone]  Name: Waylynn Leinberger MRN: [id number] Date of Birth: Aug 02, 1950

## 2016-05-08 NOTE — Patient Instructions (Signed)
Homework as assigned 

## 2016-05-11 ENCOUNTER — Encounter: Payer: Self-pay | Admitting: Physical Therapy

## 2016-05-11 ENCOUNTER — Ambulatory Visit: Payer: BLUE CROSS/BLUE SHIELD | Admitting: Speech Pathology

## 2016-05-11 ENCOUNTER — Ambulatory Visit: Payer: BLUE CROSS/BLUE SHIELD | Attending: Neurology | Admitting: Physical Therapy

## 2016-05-11 DIAGNOSIS — R471 Dysarthria and anarthria: Secondary | ICD-10-CM | POA: Diagnosis present

## 2016-05-11 DIAGNOSIS — R2681 Unsteadiness on feet: Secondary | ICD-10-CM | POA: Diagnosis present

## 2016-05-11 DIAGNOSIS — R293 Abnormal posture: Secondary | ICD-10-CM | POA: Insufficient documentation

## 2016-05-11 DIAGNOSIS — R2689 Other abnormalities of gait and mobility: Secondary | ICD-10-CM | POA: Diagnosis not present

## 2016-05-11 NOTE — Therapy (Signed)
[facility] [address] [place], [place], [address] Phone: [phone]   Fax:  [phone]  Speech Language Pathology Treatment  Patient Details  Name: Jeremy Walker MRN: [id number] Date of Birth: [birth year] Referring Provider: Alonza Bogus, DO  Encounter Date: 05/11/2016      End of Session - 05/11/16 1043    Visit Number 3   Number of Visits 17   Date for SLP Re-Evaluation 07/03/16   SLP Start Time 1016   SLP Stop Time  1059   SLP Time Calculation (min) 43 min   Activity Tolerance Patient tolerated treatment well      Past Medical History:  Diagnosis Date  . AICD (automatic cardioverter/defibrillator) present    Abdominal implant was 0064 Guidant lead 6/5 mm  . Anxiety   . Atrial fibrillation ([place])   . CAD (coronary artery disease) 03/2015   a. DESx2 to mid and distal RCA  . Childhood asthma   . Chronic combined systolic and diastolic CHF (congestive heart failure) ([place])    a. ischemic CM - EF 40-45 in 12/16 // b. Echo 12/17: Echo 12/17: EF 50-55, normal wall motion, grade 1 diastolic dysfunction  . Depression   . GERD (gastroesophageal reflux disease)   . History of hiatal hernia   . HLD (hyperlipidemia)   . Hypokalemia    Recurrent associated with cardiac arrest  . Hypothyroidism   . Leg pain    ABIs 12/17: normal   . Sudden cardiac death ([place]) 1994   aborted  . Thyroid disease   . Tobacco abuse    02-16-14 past hx. heavy smoker, quit < 72yr.    Past Surgical History:  Procedure Laterality Date  . CARDIAC CATHETERIZATION N/A 04/08/2015   Procedure: Left Heart Cath and Coronary Angiography;  Surgeon: Burnell Blanks, MD;  Location: [facility];  Service: Cardiovascular;  Laterality: N/A;  . CARDIAC CATHETERIZATION N/A 04/08/2015   Procedure: Coronary Stent Intervention;  Surgeon: Burnell Blanks, MD;  Location: [facility];  Service: Cardiovascular;  Laterality: N/A;  .  CARDIAC DEFIBRILLATOR PLACEMENT  1991   guidant  . EUS N/A 02/26/2014   Procedure: ESOPHAGEAL ENDOSCOPIC ULTRASOUND (EUS) RADIAL;  Surgeon: Milus Banister, MD;  [facility];  Service: Endoscopy;  Laterality: N/A;  radial linear  . HAND SURGERY Left 1965   "got it caught in a conveyor belt"    There were no vitals filed for this visit.      Subjective Assessment - 05/11/16 1024    Subjective "I've got a little bit of a sore throat"               ADULT SLP TREATMENT - 05/11/16 0001      General Information   Behavior/Cognition Alert;Cooperative;Pleasant mood     Treatment Provided   Treatment provided Cognitive-Linquistic     Pain Assessment   Pain Assessment No/denies pain     Cognitive-Linquistic Treatment   Treatment focused on Voice;Dysarthria   Skilled Treatment SLP used loud /a/ to recalibrate patient's loudness in conversation. Used pt's perceived effort level of 8 to improve carryover of vocal techniques to hierarchical tasks at sentence and paragraph level with increased cognitive load, as well as during conversation. Patient required min verbal and visual cues to increase loudness and benefitted from clinician demonstration and modelling. Homework provided. Sustained /a/ averaged 80.5 dB with min A cues. Sentence level: 70.7 dB, 69.8 dB avg at paragraph level min verbal and  gestural cues. Spontaneous responses averaged 65.6 dB; with verbal and gestural cues for increased effort, patient improved to 70.8 dB in structured conversational tasks.     Assessment / Recommendations / Plan   Plan Continue with current plan of care     Progression Toward Goals   Progression toward goals Progressing toward goals          SLP Education - 05/11/16 1422    Education provided Yes   Education Details vocal loudness, abdominal breathing   Person(s) Educated Patient   Methods Explanation;Demonstration;Verbal cues   Comprehension Verbalized  understanding;Returned demonstration;Verbal cues required;Need further instruction          SLP Short Term Goals - 05/11/16 1423      SLP SHORT TERM GOAL #1   Title pt will produce loud /a/ with average 85db over 4 sessions   Baseline 1.29.18   Time 3   Period Weeks   Status On-going     SLP SHORT TERM GOAL #2   Title Pt will demonstrate average of >70dB during structured sentence level speech tasks with rare min A over 2 sessions   Baseline 1.29.18, 2.1.18   Time 3   Period Weeks   Status On-going     SLP SHORT TERM GOAL #3   Title pt will produce speech >70 dB average at the paragraph level with rare min A over 2 sessions   Time 3   Period Weeks   Status On-going     SLP SHORT TERM GOAL #4   Title pt will generate 8 minutes simple to mod complex conversation at average >70dB with min nonverbal cues, over two sessions   Time 3   Period Weeks   Status On-going          SLP Long Term Goals - 05/11/16 1425      SLP LONG TERM GOAL #1   Title Pt will average 70dB over 15 minute mildly complex conversation with supervision cues   Time 7   Period Weeks   Status On-going     SLP LONG TERM GOAL #2   Title Pt will converse audibly in noisy environment/outside of therapy room over 15 minutes with supervision cues.   Time 7   Period Weeks   Status On-going          Plan - 05/11/16 1057    Clinical Impression Statement Patient continues to display mild-moderate hypokinetic dysarthria with reduced vocal amplitude and conversational loudness. Vocal effort limited somewhat by patient's sore throat today. He is responsive to verbal cues and instruction to improve loudness and intelligibility, and benefits from audio recording as biofeedback to build awareness of deficits in sensory perceptions of amplitude. He will benefit from continued skilled ST intervention to improve independence and carryover of vocal loudness/effort to increased length of utterance and conversation in  order to improve intelligibility and overall communicative function.   Speech Therapy Frequency 2x / week   Duration Other (comment)   Treatment/Interventions Cueing hierarchy;SLP instruction and feedback;Compensatory strategies;Patient/family education;Functional tasks      Patient will benefit from skilled therapeutic intervention in order to improve the following deficits and impairments:   Dysarthria and anarthria    Problem List Patient Active Problem List   Diagnosis Date Noted  . Coronary artery disease involving native heart without angina pectoris 03/07/2016  . Chronic combined systolic and diastolic CHF (congestive heart failure) ([place]) 03/07/2016  . Chest pain 09/14/2015  . Rib fracture 09/12/2015  . Abrasion of right arm  09/12/2015  . Foot swelling, Left Ankel  12/15/2014  . Discoloration of skin 12/15/2014  . PVD (peripheral vascular disease) with claudication ([place]) 11/20/2014  . Hyperlipidemia 10/13/2014  . Hypothyroidism 10/13/2014  . Leg cramps 10/13/2014  . Physical exam 04/13/2014  . Duodenal diverticulum 02/26/2014  . Bile duct abnormality 02/26/2014  . Special screening for malignant neoplasms, colon 01/30/2014  . Belching 01/30/2014  . Elevated LFTs 01/30/2014  . Flatulence/gas pain/belching 12/04/2013  . Abdominal distension 12/04/2013  . GERD (gastroesophageal reflux disease) 10/08/2013  . Ventricular fibrillation ([place]) 07/26/2010  . Implantable defibrillator-[employer] 07/26/2010  . TOBACCO ABUSE 06/08/2009   Deneise Lever, MS CF-SLP Speech-Language Pathologist  Aliene Altes 05/11/2016, 2:26 PM  [facility] [address] [place], [place], [address] Phone: [phone]   Fax:  [phone]   Name: Jeremy Walker MRN: [id number] Date of Birth: 1950-10-13

## 2016-05-11 NOTE — Therapy (Signed)
[facility] [address] [place], [place], [address] Phone: [phone]   Fax:  [phone]  Physical Therapy Treatment  Patient Details  Name: Jeremy Walker MRN: [id number] Date of Birth: Sep 21, 1950 Referring Provider: Alonza Bogus  Encounter Date: 05/11/2016      PT End of Session - 05/11/16 1018    Visit Number 3   Number of Visits 9   Date for PT Re-Evaluation 06/29/16   Authorization Type BC[employer]-30 visit limit for PT   Authorization - Visit Number 1   Authorization - Number of Visits 30   PT Start Time 0936   PT Stop Time 1017   PT Time Calculation (min) 41 min   Activity Tolerance Patient tolerated treatment well      Past Medical History:  Diagnosis Date  . AICD (automatic cardioverter/defibrillator) present    Abdominal implant was 0064 Guidant lead 6/5 mm  . Anxiety   . Atrial fibrillation ([place])   . CAD (coronary artery disease) 03/2015   a. DESx2 to mid and distal RCA  . Childhood asthma   . Chronic combined systolic and diastolic CHF (congestive heart failure) ([place])    a. ischemic CM - EF 40-45 in 12/16 // b. Echo 12/17: Echo 12/17: EF 50-55, normal wall motion, grade 1 diastolic dysfunction  . Depression   . GERD (gastroesophageal reflux disease)   . History of hiatal hernia   . HLD (hyperlipidemia)   . Hypokalemia    Recurrent associated with cardiac arrest  . Hypothyroidism   . Leg pain    ABIs 12/17: normal   . Sudden cardiac death ([place]) 1994   aborted  . Thyroid disease   . Tobacco abuse    02-16-14 past hx. heavy smoker, quit < 15yr.    Past Surgical History:  Procedure Laterality Date  . CARDIAC CATHETERIZATION N/A 04/08/2015   Procedure: Left Heart Cath and Coronary Angiography;  Surgeon: Burnell Blanks, MD;  Location: [facility];  Service: Cardiovascular;  Laterality: N/A;  . CARDIAC CATHETERIZATION N/A 04/08/2015   Procedure: Coronary Stent Intervention;  Surgeon:  Burnell Blanks, MD;  Location: [facility];  Service: Cardiovascular;  Laterality: N/A;  . CARDIAC DEFIBRILLATOR PLACEMENT  1991   guidant  . EUS N/A 02/26/2014   Procedure: ESOPHAGEAL ENDOSCOPIC ULTRASOUND (EUS) RADIAL;  Surgeon: Milus Banister, MD;  Location: [facility];  Service: Endoscopy;  Laterality: N/A;  radial linear  . HAND SURGERY Left 1965   "got it caught in a conveyor belt"    There were no vitals filed for this visit.      Subjective Assessment - 05/11/16 0937    Subjective Pt tried HEP at home since last visit; has questions.   Currently in Pain? No/denies                         [facility] Adult PT Treatment/Exercise - 05/11/16 0001      Transfers   Transfers Floor to Transfer   Floor to Transfer 5: Supervision   Floor to Transfer Details (indicate cue type and reason) Worked on transfer with external support and without external support- Pt required  cues for safe sequencing and technique with each. Performed multiple reps of transitioning from quadruped to sitting position for balance training.  Comments tried several different simulations for safe transfers from tub floor to standing using "grab bar" and stool; CGA level.                                     PWR [facility]) - 05/11/16 [id number] STANDING   PWR! Up x10   PWR! Rock [employer]! Twist x20   PWR Step x20   Comments Mod cues for sequencing movement.             PT Education - 05/11/16 [id number]    Education provided Yes   Education Details Reviewed PWR! Moves in standing; floor and tub floor transfers.   Person(s) Educated Patient   Methods Explanation;Demonstration;Verbal cues;Handout   Comprehension Verbalized understanding;Returned demonstration;Verbal cues required;Need further instruction             PT Long Term Goals - 05/02/16 1228      PT LONG TERM GOAL #1   Title Pt will be independent with HEP for improved balance,  transfers, and gait.  TARGET 05/31/16   Time 4   Period Weeks   Status New     PT LONG TERM GOAL #2   Title Pt will perform sit<>stand transfers from <18" surfaces, 8 of 10 reps, independently, for improved transfer efficiency and safety.   Time 4   Period Weeks   Status New     PT LONG TERM GOAL #3   Title Pt will improve Functional Gait Assessment to at least 20/30 for decreased fall risk.   Time 4   Period Weeks   Status New     PT LONG TERM GOAL #4   Title Pt will improve SLS to at least 3 seconds each leg, for improved stair/obstacle negotiation.   Time 4   Period Weeks   Status New     PT LONG TERM GOAL #5   Title Pt will verbalize understanding of local Parkinson's disease resources, including exercise options for continued community fitness upon D/C from PT.   Time 4   Period Weeks   Status New     Additional Long Term Goals   Additional Long Term Goals Yes     PT LONG TERM GOAL #6   Title Pt will verbalize understanding of fall prevention in the home environment.     Time 4   Period Weeks   Status New               Plan - 05/11/16 1033    Clinical Impression Statement Discussed that pt may need to reconsider taking baths on the tub floor due to concern of uncontrolled sit downs and stand ups from tub.  Attempted option of using a low grab bar and stool in tub to help  transisiton to stand but pt continued having difficulty sequencing and safety issues.  Pt did perform regular floor transfers well at supervision level with training.  Pt continues to require mod cues for PWR! Moves HEP.                                                                       Rehab Potential Good   PT  Frequency 2x / week   PT Duration 4 weeks  plus eval   PT Treatment/Interventions ADLs/Self Care Home Management;Functional mobility training;Gait training;Therapeutic activities;Therapeutic exercise;Balance training;Neuromuscular re-education;Patient/family education   PT Next  Visit Plan Floor and tub transfer training; Review HEP for PWR! Moves in standing and in sitting; sit<>stand transfers from low surfaces; gait activities; SLS (for stair/obstacle negotiation and dressing)   Consulted and Agree with Plan of Care Patient      Patient will benefit from skilled therapeutic intervention in order to improve the following deficits and impairments:  Abnormal gait, Decreased balance, Decreased mobility, Decreased strength, Difficulty walking, Postural dysfunction  Visit Diagnosis: Other abnormalities of gait and mobility  Unsteadiness on feet  Abnormal posture     Problem List Patient Active Problem List   Diagnosis Date Noted  . Coronary artery disease involving native heart without angina pectoris 03/07/2016  . Chronic combined systolic and diastolic CHF (congestive heart failure) ([place]) 03/07/2016  . Chest pain 09/14/2015  . Rib fracture 09/12/2015  . Abrasion of right arm 09/12/2015  . Foot swelling, Left Ankel  12/15/2014  . Discoloration of skin 12/15/2014  . PVD (peripheral vascular disease) with claudication ([place]) 11/20/2014  . Hyperlipidemia 10/13/2014  . Hypothyroidism 10/13/2014  . Leg cramps 10/13/2014  . Physical exam 04/13/2014  . Duodenal diverticulum 02/26/2014  . Bile duct abnormality 02/26/2014  . Special screening for malignant neoplasms, colon 01/30/2014  . Belching 01/30/2014  . Elevated LFTs 01/30/2014  . Flatulence/gas pain/belching 12/04/2013  . Abdominal distension 12/04/2013  . GERD (gastroesophageal reflux disease) 10/08/2013  . Ventricular fibrillation ([place]) 07/26/2010  . Implantable defibrillator-[employer] 07/26/2010  . TOBACCO ABUSE 06/08/2009    Bjorn Loser, PTA  05/11/16, 10:42 AM [facility] [address] [place], [place], [address] Phone: [phone]   Fax:  [phone]  Name: Dhruva Fearnley MRN: [id number] Date of Birth: [birth year]

## 2016-05-15 ENCOUNTER — Encounter: Payer: Self-pay | Admitting: Physical Therapy

## 2016-05-15 ENCOUNTER — Ambulatory Visit: Payer: BLUE CROSS/BLUE SHIELD | Admitting: Speech Pathology

## 2016-05-15 ENCOUNTER — Ambulatory Visit: Payer: BLUE CROSS/BLUE SHIELD | Admitting: Physical Therapy

## 2016-05-15 DIAGNOSIS — R2689 Other abnormalities of gait and mobility: Secondary | ICD-10-CM

## 2016-05-15 DIAGNOSIS — R471 Dysarthria and anarthria: Secondary | ICD-10-CM

## 2016-05-15 DIAGNOSIS — R2681 Unsteadiness on feet: Secondary | ICD-10-CM

## 2016-05-15 DIAGNOSIS — R293 Abnormal posture: Secondary | ICD-10-CM

## 2016-05-15 NOTE — Therapy (Signed)
[facility] [address] [place], [place], [address] Phone: [phone]   Fax:  [phone]  Physical Therapy Treatment  Patient Details  Name: Jeremy Walker MRN: [id number] Date of Birth: 06-02-1950 Referring Provider: Alonza Bogus  Encounter Date: 05/15/2016      PT End of Session - 05/15/16 1226    Visit Number 4   Number of Visits 9   Date for PT Re-Evaluation 06/29/16   Authorization Type BCBS-30 visit limit for PT   PT Start Time 1105   PT Stop Time 1143   PT Time Calculation (min) 38 min   Activity Tolerance Patient tolerated treatment well      Past Medical History:  Diagnosis Date  . AICD (automatic cardioverter/defibrillator) present    Abdominal implant was 0064 Guidant lead 6/5 mm  . Anxiety   . Atrial fibrillation ([place])   . CAD (coronary artery disease) 03/2015   a. DESx2 to mid and distal RCA  . Childhood asthma   . Chronic combined systolic and diastolic CHF (congestive heart failure) ([place])    a. ischemic CM - EF 40-45 in 12/16 // b. Echo 12/17: Echo 12/17: EF 50-55, normal wall motion, grade 1 diastolic dysfunction  . Depression   . GERD (gastroesophageal reflux disease)   . History of hiatal hernia   . HLD (hyperlipidemia)   . Hypokalemia    Recurrent associated with cardiac arrest  . Hypothyroidism   . Leg pain    ABIs 12/17: normal   . Sudden cardiac death ([place]) 1994   aborted  . Thyroid disease   . Tobacco abuse    02-16-14 past hx. heavy smoker, quit < 58yr.    Past Surgical History:  Procedure Laterality Date  . CARDIAC CATHETERIZATION N/A 04/08/2015   Procedure: Left Heart Cath and Coronary Angiography;  Surgeon: Burnell Blanks, MD;  Location: [facility];  Service: Cardiovascular;  Laterality: N/A;  . CARDIAC CATHETERIZATION N/A 04/08/2015   Procedure: Coronary Stent Intervention;  Surgeon: Burnell Blanks, MD;  Location: [facility];  Service:  Cardiovascular;  Laterality: N/A;  . CARDIAC DEFIBRILLATOR PLACEMENT  1991   guidant  . EUS N/A 02/26/2014   Procedure: ESOPHAGEAL ENDOSCOPIC ULTRASOUND (EUS) RADIAL;  Surgeon: Milus Banister, MD;  [facility];  Service: Endoscopy;  Laterality: N/A;  radial linear  . HAND SURGERY Left 1965   "got it caught in a conveyor belt"    There were no vitals filed for this visit.      Subjective Assessment - 05/15/16 1106    Subjective "I've been practising getting up and down," from ground and bath tub.  In bath tub, pt has a grab bar that he's been practising were he needs it. Pt also reports performing PWR! Moves with greater understanding.   Currently in Pain? No/denies                         [facility] Adult PT Treatment/Exercise - 05/15/16 0001      Ambulation/Gait   Stairs Yes   Stairs Assistance 5: Supervision   Stairs Assistance Details (indicate cue type and reason) Initally used 2 rails reciprocal gait, then worked on balance activites and progressed to no rails.   Stair Management Technique Two rails;No rails;Alternating pattern   Number of Stairs 4  x4     Knee/Hip Exercises: Aerobic   Nustep Resting HR 65 bpm; LEVEL 4, >70 steps/min. 10 min;  activtiy HR 74bpm, pt has a pacemaker.                                     Balance Exercises - 05/15/16 1110      Balance Exercises: Standing   SLS Eyes open  Multidirectional tapping   Step Ups Forward;6 inch;Intermittent UE support  x10 leading with each foot.   Sit to Stand Time 2x10 from low chair, no UE support           PT Education - 05/15/16 1128    Education provided Yes   Education Details Pt is interested in getting an exercise bike at home.  Discussed options and trialled nu-step.   Person(s) Educated Patient   Methods Explanation;Demonstration;Verbal cues   Comprehension Verbalized understanding;Returned demonstration             PT Long Term Goals - 05/02/16 1228       PT LONG TERM GOAL #1   Title Pt will be independent with HEP for improved balance, transfers, and gait.  TARGET 05/31/16   Time 4   Period Weeks   Status New     PT LONG TERM GOAL #2   Title Pt will perform sit<>stand transfers from <18" surfaces, 8 of 10 reps, independently, for improved transfer efficiency and safety.   Time 4   Period Weeks   Status New     PT LONG TERM GOAL #3   Title Pt will improve Functional Gait Assessment to at least 20/30 for decreased fall risk.   Time 4   Period Weeks   Status New     PT LONG TERM GOAL #4   Title Pt will improve SLS to at least 3 seconds each leg, for improved stair/obstacle negotiation.   Time 4   Period Weeks   Status New     PT LONG TERM GOAL #5   Title Pt will verbalize understanding of local Parkinson's disease resources, including exercise options for continued community fitness upon D/C from PT.   Time 4   Period Weeks   Status New     Additional Long Term Goals   Additional Long Term Goals Yes     PT LONG TERM GOAL #6   Title Pt will verbalize understanding of fall prevention in the home environment.     Time 4   Period Weeks   Status New               Plan - 05/15/16 1131    Clinical Impression Statement Pt requires supervison with SLS activites without UE support. Pt performed nu step well, continuously for 14min at speed >70 steps/min. Pt plans to follow up with cardiologist about having the stationary bike at an option for exercise.  Pt had no issues for sit<> from low chair without UE support.                                              Rehab Potential Good   PT Frequency 2x / week   PT Duration 4 weeks  plus eval   PT Treatment/Interventions ADLs/Self Care Home Management;Functional mobility training;Gait training;Therapeutic activities;Therapeutic exercise;Balance training;Neuromuscular re-education;Patient/family education   PT Next Visit Plan  PWR! Moves in sitting; gait activities; SLS (for  stair/obstacle negotiation and dressing), stepping  strategies on compliant surface.   Consulted and Agree with Plan of Care Patient      Patient will benefit from skilled therapeutic intervention in order to improve the following deficits and impairments:  Abnormal gait, Decreased balance, Decreased mobility, Decreased strength, Difficulty walking, Postural dysfunction  Visit Diagnosis: Other abnormalities of gait and mobility  Unsteadiness on feet  Abnormal posture     Problem List Patient Active Problem List   Diagnosis Date Noted  . Coronary artery disease involving native heart without angina pectoris 03/07/2016  . Chronic combined systolic and diastolic CHF (congestive heart failure) ([place]) 03/07/2016  . Chest pain 09/14/2015  . Rib fracture 09/12/2015  . Abrasion of right arm 09/12/2015  . Foot swelling, Left Ankel  12/15/2014  . Discoloration of skin 12/15/2014  . PVD (peripheral vascular disease) with claudication ([place]) 11/20/2014  . Hyperlipidemia 10/13/2014  . Hypothyroidism 10/13/2014  . Leg cramps 10/13/2014  . Physical exam 04/13/2014  . Duodenal diverticulum 02/26/2014  . Bile duct abnormality 02/26/2014  . Special screening for malignant neoplasms, colon 01/30/2014  . Belching 01/30/2014  . Elevated LFTs 01/30/2014  . Flatulence/gas pain/belching 12/04/2013  . Abdominal distension 12/04/2013  . GERD (gastroesophageal reflux disease) 10/08/2013  . Ventricular fibrillation ([place]) 07/26/2010  . Implantable defibrillator-[employer] 07/26/2010  . TOBACCO ABUSE 06/08/2009    Bjorn Loser, PTA  05/15/16, 12:32 PM [facility] [address] [place], [place], [address] Phone: [phone]   Fax:  [phone]  Name: Jeremy Walker MRN: [id number] Date of Birth: 1950/11/16

## 2016-05-15 NOTE — Therapy (Signed)
[facility] [address] [place], [place], [address] Phone: [phone]   Fax:  [phone]  Speech Language Pathology Treatment  Patient Details  Name: Jeremy Walker MRN: [id number] Date of Birth: July 06, 1950 Referring Provider: Alonza Bogus, DO  Encounter Date: 05/15/2016      End of Session - 05/15/16 1103    Visit Number 4   Number of Visits 17   Date for SLP Re-Evaluation 07/03/16   SLP Start Time 15   SLP Stop Time  1050   SLP Time Calculation (min) 44 min   Activity Tolerance Patient tolerated treatment well      Past Medical History:  Diagnosis Date  . AICD (automatic cardioverter/defibrillator) present    Abdominal implant was 0064 Guidant lead 6/5 mm  . Anxiety   . Atrial fibrillation ([place])   . CAD (coronary artery disease) 03/2015   a. DESx2 to mid and distal RCA  . Childhood asthma   . Chronic combined systolic and diastolic CHF (congestive heart failure) ([place])    a. ischemic CM - EF 40-45 in 12/16 // b. Echo 12/17: Echo 12/17: EF 50-55, normal wall motion, grade 1 diastolic dysfunction  . Depression   . GERD (gastroesophageal reflux disease)   . History of hiatal hernia   . HLD (hyperlipidemia)   . Hypokalemia    Recurrent associated with cardiac arrest  . Hypothyroidism   . Leg pain    ABIs 12/17: normal   . Sudden cardiac death ([place]) 1994   aborted  . Thyroid disease   . Tobacco abuse    02-16-14 past hx. heavy smoker, quit < 33yr.    Past Surgical History:  Procedure Laterality Date  . CARDIAC CATHETERIZATION N/A 04/08/2015   Procedure: Left Heart Cath and Coronary Angiography;  Surgeon: Burnell Blanks, MD;  Location: [facility];  Service: Cardiovascular;  Laterality: N/A;  . CARDIAC CATHETERIZATION N/A 04/08/2015   Procedure: Coronary Stent Intervention;  Surgeon: Burnell Blanks, MD;  Location: [facility];  Service: Cardiovascular;  Laterality: N/A;  .  CARDIAC DEFIBRILLATOR PLACEMENT  1991   guidant  . EUS N/A 02/26/2014   Procedure: ESOPHAGEAL ENDOSCOPIC ULTRASOUND (EUS) RADIAL;  Surgeon: Milus Banister, MD;  [facility];  Service: Endoscopy;  Laterality: N/A;  radial linear  . HAND SURGERY Left 1965   "got it caught in a conveyor belt"    There were no vitals filed for this visit.      Subjective Assessment - 05/15/16 1037    Subjective "My voice is sounding lower."   Currently in Pain? No/denies               ADULT SLP TREATMENT - 05/15/16 0001      General Information   Behavior/Cognition Alert;Cooperative;Pleasant mood     Treatment Provided   Treatment provided Cognitive-Linquistic     Pain Assessment   Pain Assessment No/denies pain     Cognitive-Linquistic Treatment   Treatment focused on Voice;Dysarthria   Skilled Treatment SLP used loud /a/ to recalibrate patient's loudness in conversation. Patient began task with notably lower pitch, states, "I feel like my voice is lower." He is noted with reduced abdominal breath support with primarily brief, chest-focused breathing. Instructed patient in abdominal breathing exercises. He required mod-max tactile and verbal cues to use good technique, initially, with fading SLP support to self monitor breathing with 60% accuracy. Provided instruction in additional HEP program for abdominal breathing to improve vocal  quality and to reduce strain. Completed loud /a/ x 5 with min verbal cues for AB and pitch with average dB 80.9. Progressed to word level task; patient completes with 85% accuracy for AB with moderate cues. Homework assigned.     Assessment / Recommendations / Plan   Plan Continue with current plan of care     Progression Toward Goals   Progression toward goals Progressing toward goals          SLP Education - 05/15/16 1103    Education provided Yes   Education Details abdominal breathing, pitch adjustment   Person(s) Educated Patient    Methods Explanation;Demonstration;Tactile cues;Verbal cues;Handout   Comprehension Verbalized understanding;Returned demonstration;Verbal cues required;Tactile cues required;Need further instruction          SLP Short Term Goals - 05/15/16 1414      SLP SHORT TERM GOAL #1   Title pt will produce loud /a/ with average 85db over 4 sessions   Baseline 1.29.18   Time 2   Period Weeks   Status On-going     SLP SHORT TERM GOAL #2   Title Pt will demonstrate average of >70dB during structured sentence level speech tasks with rare min A over 2 sessions   Baseline 1.29.18, 2.1.18   Time 2   Period Weeks   Status Achieved     SLP SHORT TERM GOAL #3   Title pt will produce speech >70 dB average while using AB for good vocal quality at the sentence level with rare min A over 2 sessions   Time 2   Period Weeks   Status Revised     SLP SHORT TERM GOAL #4   Title pt will generate 8 minutes simple to mod complex conversation at average >70dB with min nonverbal cues, over two sessions   Time 2   Period Weeks   Status On-going          SLP Long Term Goals - 05/15/16 1420      SLP LONG TERM GOAL #1   Title Pt will average 70dB over 15 minute mildly complex conversation with supervision cues   Time 6   Period Weeks   Status On-going     SLP LONG TERM GOAL #2   Title Pt will converse audibly in noisy environment/outside of therapy room over 15 minutes with supervision cues.   Time 6   Status On-going          Plan - 05/15/16 1413    Clinical Impression Statement Patient continues to display mild-moderate hypokinetic dysarthria with reduced vocal amplitude and conversational loudness. Patient is demonstrating reduced breath support and exhibited some hoarseness, strain. He benefitted from additional instruction and skilled feedback in abdominal breathing techniques to improve vocal quality. He will benefit from continued skilled ST intervention to improve independence and carryover  of vocal loudness/effort, quality and AB to increased length of utterance and conversation in order to improve intelligibility and overall communicative function.   Speech Therapy Frequency 2x / week   Duration Other (comment)   Treatment/Interventions Cueing hierarchy;SLP instruction and feedback;Compensatory strategies;Patient/family education;Functional tasks   Potential to Achieve Goals Good   Potential Considerations Medical prognosis   SLP Home Exercise Plan abdominal breathing, sustained s, word level tasks with AB   Consulted and Agree with Plan of Care Patient      Patient will benefit from skilled therapeutic intervention in order to improve the following deficits and impairments:   Dysarthria and anarthria    Problem List  Patient Active Problem List   Diagnosis Date Noted  . Coronary artery disease involving native heart without angina pectoris 03/07/2016  . Chronic combined systolic and diastolic CHF (congestive heart failure) ([place]) 03/07/2016  . Chest pain 09/14/2015  . Rib fracture 09/12/2015  . Abrasion of right arm 09/12/2015  . Foot swelling, Left Ankel  12/15/2014  . Discoloration of skin 12/15/2014  . PVD (peripheral vascular disease) with claudication ([place]) 11/20/2014  . Hyperlipidemia 10/13/2014  . Hypothyroidism 10/13/2014  . Leg cramps 10/13/2014  . Physical exam 04/13/2014  . Duodenal diverticulum 02/26/2014  . Bile duct abnormality 02/26/2014  . Special screening for malignant neoplasms, colon 01/30/2014  . Belching 01/30/2014  . Elevated LFTs 01/30/2014  . Flatulence/gas pain/belching 12/04/2013  . Abdominal distension 12/04/2013  . GERD (gastroesophageal reflux disease) 10/08/2013  . Ventricular fibrillation ([place]) 07/26/2010  . Implantable defibrillator-[employer] 07/26/2010  . TOBACCO ABUSE 06/08/2009   Deneise Lever, MS CF-SLP Speech-Language Pathologist  Aliene Altes 05/15/2016, 2:21 PM  [facility] [address] [place], [place], [address] Phone: [phone]   Fax:  [phone]   Name: Kyser Dale MRN: [id number] Date of Birth: [birth year]

## 2016-05-15 NOTE — Patient Instructions (Signed)
Homework assigned 

## 2016-05-18 ENCOUNTER — Ambulatory Visit: Payer: BLUE CROSS/BLUE SHIELD | Admitting: Physical Therapy

## 2016-05-18 ENCOUNTER — Ambulatory Visit: Payer: BLUE CROSS/BLUE SHIELD | Admitting: Speech Pathology

## 2016-05-22 ENCOUNTER — Ambulatory Visit: Payer: BLUE CROSS/BLUE SHIELD | Admitting: Physical Therapy

## 2016-05-22 ENCOUNTER — Ambulatory Visit: Payer: BLUE CROSS/BLUE SHIELD | Admitting: Speech Pathology

## 2016-05-22 DIAGNOSIS — R471 Dysarthria and anarthria: Secondary | ICD-10-CM

## 2016-05-22 DIAGNOSIS — R2681 Unsteadiness on feet: Secondary | ICD-10-CM

## 2016-05-22 DIAGNOSIS — R293 Abnormal posture: Secondary | ICD-10-CM

## 2016-05-22 DIAGNOSIS — R2689 Other abnormalities of gait and mobility: Secondary | ICD-10-CM | POA: Diagnosis not present

## 2016-05-22 NOTE — Patient Instructions (Signed)

## 2016-05-22 NOTE — Therapy (Addendum)
[facility] [address] [place], [place], [address] Phone: [phone]   Fax:  [phone]  Physical Therapy Treatment  Patient Details  Name: Jeremy Walker MRN: [id number] Date of Birth: [birth year] Referring Provider: Alonza Bogus  Encounter Date: 05/22/2016      PT End of Session - 05/22/16 1154    Visit Number 5   Number of Visits 9   Date for PT Re-Evaluation 06/29/16   Authorization Type [id number] visit limit for PT   Authorization - Visit Number 2   Authorization - Number of Visits 30   PT Start Time 1100   PT Stop Time 1150   PT Time Calculation (min) 50 min   Activity Tolerance Patient tolerated treatment well   Behavior During Therapy [facility] for tasks assessed/performed      Past Medical History:  Diagnosis Date  . AICD (automatic cardioverter/defibrillator) present    Abdominal implant was 0064 Guidant lead 6/5 mm  . Anxiety   . Atrial fibrillation ([place])   . CAD (coronary artery disease) 03/2015   a. DESx2 to mid and distal RCA  . Childhood asthma   . Chronic combined systolic and diastolic CHF (congestive heart failure) ([place])    a. ischemic CM - EF 40-45 in 12/16 // b. Echo 12/17: Echo 12/17: EF 50-55, normal wall motion, grade 1 diastolic dysfunction  . Depression   . GERD (gastroesophageal reflux disease)   . History of hiatal hernia   . HLD (hyperlipidemia)   . Hypokalemia    Recurrent associated with cardiac arrest  . Hypothyroidism   . Leg pain    ABIs 12/17: normal   . Sudden cardiac death ([place]) 1994   aborted  . Thyroid disease   . Tobacco abuse    02-16-14 past hx. heavy smoker, quit < 66yr.    Past Surgical History:  Procedure Laterality Date  . CARDIAC CATHETERIZATION N/A 04/08/2015   Procedure: Left Heart Cath and Coronary Angiography;  Surgeon: Burnell Blanks, MD;  Location: [facility];  Service: Cardiovascular;  Laterality: N/A;  . CARDIAC CATHETERIZATION N/A  04/08/2015   Procedure: Coronary Stent Intervention;  Surgeon: Burnell Blanks, MD;  Location: [facility];  Service: Cardiovascular;  Laterality: N/A;  . CARDIAC DEFIBRILLATOR PLACEMENT  1991   guidant  . EUS N/A 02/26/2014   Procedure: ESOPHAGEAL ENDOSCOPIC ULTRASOUND (EUS) RADIAL;  Surgeon: Milus Banister, MD;  Location: [facility];  Service: Endoscopy;  Laterality: N/A;  radial linear  . HAND SURGERY Left 1965   "got it caught in a conveyor belt"    There were no vitals filed for this visit.      Subjective Assessment - 05/29/16 0829    Subjective Reports getting down on the floor today to clean under refrigerator; able to get back up but it was difficult and required a lot of energy.  Would like to keep working on it.  Reports getting dizzy when lying flat on his back.     Patient Stated Goals Pt's goal for therapy is to get balance back and be able to walk better.    Currently in Pain? No/denies          Vestibular Assessment - 05/22/16 1118      Symptom Behavior   Type of Dizziness Comment  Swimmy headed   Frequency of Dizziness intermittent   Duration of Dizziness <30 seconds   Aggravating Factors Lying supine   Relieving Factors --  Sitting back  up     Positional Testing   Sidelying Test Sidelying Right;Sidelying Left     Sidelying Right   Sidelying Right Duration <30 seconds   Sidelying Right Symptoms No nystagmus     Sidelying Left   Sidelying Left Duration <30 seconds   Sidelying Left Symptoms No nystagmus           [facility] Adult PT Treatment/Exercise - 05/22/16 1131      Self-Care   Self-Care Other Self-Care Comments   Other Self-Care Comments  Reviewed and performed floor <> stand transfers with one UE support on mat Mod I and then without UE support with pt performing x 3 reps Mod I         Vestibular Treatment/Exercise - 05/22/16 1120      Vestibular Treatment/Exercise   Vestibular Treatment Provided Habituation    Habituation Exercises Legrand Como Daroff   Number of Reps  2   Symptom Description  mild dizziness, <30 seconds          PWR [facility]) - 05/22/16 1128    PWR! exercises Moves in standing   PWR! Up x10   PWR! Rock [employer] Twist x20   PWR Step x20   Comments Mod cues for technique and weight shifting          Balance Exercises - 05/22/16 1152      Balance Exercises: Standing   SLS Eyes open;Solid surface;5 reps  5 reps to L and R; incoporating PWR Rock for weight shift           PT Education - 05/22/16 1153    Education provided Yes   Education Details Vestibular habituation to allow pt to lie on floor for home repairs/maintenance.  Weight shifting to improve balance during SLS   Person(s) Educated Patient   Methods Explanation;Demonstration;Handout   Comprehension Verbalized understanding             PT Long Term Goals - 05/02/16 1228      PT LONG TERM GOAL #1   Title Pt will be independent with HEP for improved balance, transfers, and gait.  TARGET 05/31/16   Time 4   Period Weeks   Status New     PT LONG TERM GOAL #2   Title Pt will perform sit<>stand transfers from <18" surfaces, 8 of 10 reps, independently, for improved transfer efficiency and safety.   Time 4   Period Weeks   Status New     PT LONG TERM GOAL #3   Title Pt will improve Functional Gait Assessment to at least 20/30 for decreased fall risk.   Time 4   Period Weeks   Status New     PT LONG TERM GOAL #4   Title Pt will improve SLS to at least 3 seconds each leg, for improved stair/obstacle negotiation.   Time 4   Period Weeks   Status New     PT LONG TERM GOAL #5   Title Pt will verbalize understanding of local Parkinson's disease resources, including exercise options for continued community fitness upon D/C from PT.   Time 4   Period Weeks   Status New     Additional Long Term Goals   Additional Long Term Goals Yes     PT LONG TERM GOAL #6   Title Pt will  verbalize understanding of fall prevention in the home environment.     Time 4   Period Weeks   Status  New               Plan - 05/22/16 1155    Clinical Impression Statement Pt continues to make progress and was able to demonstrate Mod I technique for floor <> stand transfer with and without UE support.  Pt reporting dizziness with lying on back flat to perform home maintenance; pt noted to have positional vertigo without nystagmus; provided pt with habituation exercises.  Continued to assess independence with PWR exercises and incorporation of PWR moves into more functional movements: SLS for stair negotiation, obstacle negotiation and donning lower body clothing.     Rehab Potential Good   PT Treatment/Interventions ADLs/Self Care Home Management;Functional mobility training;Gait training;Therapeutic activities;Therapeutic exercise;Balance training;Neuromuscular re-education;Patient/family education   PT Next Visit Plan  PWR! Moves in standing on compliant surface; gait activities; SLS (for stair/obstacle negotiation and dressing), stepping strategies on compliant surface.   Consulted and Agree with Plan of Care Patient      Patient will benefit from skilled therapeutic intervention in order to improve the following deficits and impairments:  Abnormal gait, Decreased balance, Decreased mobility, Decreased strength, Difficulty walking, Postural dysfunction  Visit Diagnosis: Other abnormalities of gait and mobility  Unsteadiness on feet  Abnormal posture     Problem List Patient Active Problem List   Diagnosis Date Noted  . Coronary artery disease involving native heart without angina pectoris 03/07/2016  . Chronic combined systolic and diastolic CHF (congestive heart failure) ([place]) 03/07/2016  . Chest pain 09/14/2015  . Rib fracture 09/12/2015  . Abrasion of right arm 09/12/2015  . Foot swelling, Left Ankel  12/15/2014  . Discoloration of skin 12/15/2014  . PVD  (peripheral vascular disease) with claudication ([place]) 11/20/2014  . Hyperlipidemia 10/13/2014  . Hypothyroidism 10/13/2014  . Leg cramps 10/13/2014  . Physical exam 04/13/2014  . Duodenal diverticulum 02/26/2014  . Bile duct abnormality 02/26/2014  . Special screening for malignant neoplasms, colon 01/30/2014  . Belching 01/30/2014  . Elevated LFTs 01/30/2014  . Flatulence/gas pain/belching 12/04/2013  . Abdominal distension 12/04/2013  . GERD (gastroesophageal reflux disease) 10/08/2013  . Ventricular fibrillation ([place]) 07/26/2010  . Implantable defibrillator-[employer] 07/26/2010  . TOBACCO ABUSE 06/08/2009   Raylene Everts, PT, DPT 05/22/16    12:00 PM    [facility] [address] [place], [place], [address] Phone: [phone]   Fax:  [phone]  Name: Coyote Magalhaes MRN: [id number] Date of Birth: 20-Aug-1950

## 2016-05-22 NOTE — Therapy (Signed)
[facility] [address] [place], [place], [address] Phone: [phone]   Fax:  [phone]  Speech Language Pathology Treatment  Patient Details  Name: Jeremy Walker MRN: [id number] Date of Birth: 05/26/1950 Referring Provider: Alonza Bogus, DO  Encounter Date: 05/22/2016      End of Session - 05/22/16 1306    Visit Number 5   Number of Visits 17   Date for SLP Re-Evaluation 07/03/16   SLP Start Time 0937   SLP Stop Time  1016   SLP Time Calculation (min) 39 min   Activity Tolerance Patient tolerated treatment well      Past Medical History:  Diagnosis Date  . AICD (automatic cardioverter/defibrillator) present    Abdominal implant was 0064 Guidant lead 6/5 mm  . Anxiety   . Atrial fibrillation ([place])   . CAD (coronary artery disease) 03/2015   a. DESx2 to mid and distal RCA  . Childhood asthma   . Chronic combined systolic and diastolic CHF (congestive heart failure) ([place])    a. ischemic CM - EF 40-45 in 12/16 // b. Echo 12/17: Echo 12/17: EF 50-55, normal wall motion, grade 1 diastolic dysfunction  . Depression   . GERD (gastroesophageal reflux disease)   . History of hiatal hernia   . HLD (hyperlipidemia)   . Hypokalemia    Recurrent associated with cardiac arrest  . Hypothyroidism   . Leg pain    ABIs 12/17: normal   . Sudden cardiac death ([place]) 1994   aborted  . Thyroid disease   . Tobacco abuse    02-16-14 past hx. heavy smoker, quit < 49yr.    Past Surgical History:  Procedure Laterality Date  . CARDIAC CATHETERIZATION N/A 04/08/2015   Procedure: Left Heart Cath and Coronary Angiography;  Surgeon: Burnell Blanks, MD;  Location: [facility];  Service: Cardiovascular;  Laterality: N/A;  . CARDIAC CATHETERIZATION N/A 04/08/2015   Procedure: Coronary Stent Intervention;  Surgeon: Burnell Blanks, MD;  Location: [facility];  Service: Cardiovascular;  Laterality: N/A;  .  CARDIAC DEFIBRILLATOR PLACEMENT  1991   guidant  . EUS N/A 02/26/2014   Procedure: ESOPHAGEAL ENDOSCOPIC ULTRASOUND (EUS) RADIAL;  Surgeon: Milus Banister, MD;  Location: [facility];  Service: Endoscopy;  Laterality: N/A;  radial linear  . HAND SURGERY Left 1965   "got it caught in a conveyor belt"    There were no vitals filed for this visit.      Subjective Assessment - 05/22/16 0942    Subjective "I've been trying to breathe in through my belly instead of my upper chest."   Currently in Pain? No/denies               ADULT SLP TREATMENT - 05/22/16 0001      General Information   Behavior/Cognition Alert;Cooperative;Pleasant mood     Treatment Provided   Treatment provided Cognitive-Linquistic     Pain Assessment   Pain Assessment No/denies pain     Cognitive-Linquistic Treatment   Treatment focused on Voice;Dysarthria   Skilled Treatment SLP used loud /a/ and perceived effort level of 8 to recalibrate patient's loudness in conversation. Patient states he has been working on abdominal breathing techniques at home. He initially required min-mod verbal cues and demonstration to achieve appropriate technique for sustained phonation task, and increased independence as task progressed. Progressed to word level tasks; patient required initial moderate cues for pacing and adequate breath support. SLP able to fade  cues for pt independence. Progressed to phrase and sentence level with min A and occasional verbal cues to reduce strain, increase frequency and depth of breath. Patient produced loud /a/ average 83.2 dB, word level tasks, 73.5 dB avg, sentence level 74.9 dB avg. Spontaneous responses 65.3 dB, improved with verbal cues to 74.9 dB. Sentence level with increased cognitive load tasks assigned for home exercise.     Assessment / Recommendations / Plan   Plan Continue with current plan of care     Progression Toward Goals   Progression toward goals Progressing toward  goals          SLP Education - 05/22/16 1300    Education provided Yes   Education Details abdominal breathing, postural support, increase mouth opening, reduced rate of speech, increased vocal amplitude to improve intelligibility   Person(s) Educated Patient   Methods Explanation;Demonstration;Verbal cues   Comprehension Verbalized understanding;Need further instruction;Returned demonstration          SLP Short Term Goals - 05/22/16 1307      SLP SHORT TERM GOAL #1   Title pt will produce loud /a/ with average >80db over 4 sessions   Baseline 1.29.18, 2.12.18   Time 2   Period Weeks   Status On-going     SLP SHORT TERM GOAL #2   Title Pt will demonstrate average of >70dB during structured sentence level speech tasks with rare min A over 2 sessions   Baseline 1.29.18, 2.1.18   Time 2   Period Weeks   Status Achieved     SLP SHORT TERM GOAL #3   Title pt will produce speech >70 dB average while using AB for good vocal quality at the sentence level with rare min A over 2 sessions   Baseline 2.12.18   Time 2   Period Weeks   Status On-going     SLP SHORT TERM GOAL #4   Title pt will generate 8 minutes simple to mod complex conversation at average >70dB with min nonverbal cues, over two sessions   Time 2   Period Weeks   Status On-going          SLP Long Term Goals - 05/22/16 1309      SLP LONG TERM GOAL #1   Title Pt will average 70dB over 15 minute mildly complex conversation with supervision cues   Time 6   Period Weeks   Status On-going     SLP LONG TERM GOAL #2   Title Pt will converse audibly in noisy environment/outside of therapy room over 15 minutes with supervision cues.   Time 6   Period Weeks   Status On-going          Plan - 05/22/16 1306    Clinical Impression Statement Patient continues to display mild-moderate hypokinetic dysarthria with reduced vocal amplitude and conversational loudness. Patient is demonstrating improvements in use of  instructed techniques to improve vocal quality. He benefitted from additional instruction and skilled feedback in abdominal breathing techniques to improve vocal quality. He will benefit from continued skilled ST intervention to improve independence and carryover of vocal loudness/effort, quality and AB to increased length of utterance and conversation in order to improve intelligibility and overall communicative function.   Speech Therapy Frequency 2x / week   Duration Other (comment)   Treatment/Interventions Cueing hierarchy;SLP instruction and feedback;Compensatory strategies;Patient/family education;Functional tasks   Potential to Achieve Goals Good   Potential Considerations Medical prognosis   SLP Home Exercise Plan abdominal breathing, sustained s,  word level tasks with AB   Consulted and Agree with Plan of Care Patient      Patient will benefit from skilled therapeutic intervention in order to improve the following deficits and impairments:   Dysarthria and anarthria    Problem List Patient Active Problem List   Diagnosis Date Noted  . Coronary artery disease involving native heart without angina pectoris 03/07/2016  . Chronic combined systolic and diastolic CHF (congestive heart failure) ([place]) 03/07/2016  . Chest pain 09/14/2015  . Rib fracture 09/12/2015  . Abrasion of right arm 09/12/2015  . Foot swelling, Left Ankel  12/15/2014  . Discoloration of skin 12/15/2014  . PVD (peripheral vascular disease) with claudication ([place]) 11/20/2014  . Hyperlipidemia 10/13/2014  . Hypothyroidism 10/13/2014  . Leg cramps 10/13/2014  . Physical exam 04/13/2014  . Duodenal diverticulum 02/26/2014  . Bile duct abnormality 02/26/2014  . Special screening for malignant neoplasms, colon 01/30/2014  . Belching 01/30/2014  . Elevated LFTs 01/30/2014  . Flatulence/gas pain/belching 12/04/2013  . Abdominal distension 12/04/2013  . GERD (gastroesophageal reflux disease) 10/08/2013  .  Ventricular fibrillation ([place]) 07/26/2010  . Implantable defibrillator-[employer] 07/26/2010  . TOBACCO ABUSE 06/08/2009   Deneise Lever, MS CF-SLP Speech-Language Pathologist  Aliene Altes 05/22/2016, 1:10 PM  [facility] [address] [place], [place], [address] Phone: [phone]   Fax:  [phone]   Name: Jeremy Walker MRN: [id number] Date of Birth: January 26, 1951

## 2016-05-25 ENCOUNTER — Ambulatory Visit: Payer: BLUE CROSS/BLUE SHIELD | Admitting: Speech Pathology

## 2016-05-25 ENCOUNTER — Other Ambulatory Visit: Payer: Self-pay | Admitting: Physician Assistant

## 2016-05-25 ENCOUNTER — Ambulatory Visit: Payer: BLUE CROSS/BLUE SHIELD | Admitting: Physical Therapy

## 2016-05-25 DIAGNOSIS — I2510 Atherosclerotic heart disease of native coronary artery without angina pectoris: Secondary | ICD-10-CM

## 2016-05-25 DIAGNOSIS — E785 Hyperlipidemia, unspecified: Secondary | ICD-10-CM

## 2016-05-29 ENCOUNTER — Encounter: Payer: Self-pay | Admitting: Physical Therapy

## 2016-05-29 ENCOUNTER — Ambulatory Visit: Payer: BLUE CROSS/BLUE SHIELD | Admitting: Speech Pathology

## 2016-05-29 ENCOUNTER — Ambulatory Visit: Payer: BLUE CROSS/BLUE SHIELD | Admitting: Physical Therapy

## 2016-05-29 DIAGNOSIS — R471 Dysarthria and anarthria: Secondary | ICD-10-CM

## 2016-05-29 DIAGNOSIS — R293 Abnormal posture: Secondary | ICD-10-CM

## 2016-05-29 DIAGNOSIS — R2681 Unsteadiness on feet: Secondary | ICD-10-CM

## 2016-05-29 DIAGNOSIS — R2689 Other abnormalities of gait and mobility: Secondary | ICD-10-CM | POA: Diagnosis not present

## 2016-05-29 NOTE — Therapy (Signed)
[facility] [address] [place], [place], [address] Phone: [phone]   Fax:  [phone]  Speech Language Pathology Treatment  Patient Details  Name: Jeremy Walker MRN: [id number] Date of Birth: [birth year] Referring Provider: Alonza Bogus, DO  Encounter Date: 05/29/2016      End of Session - 05/29/16 1105    Visit Number 6   Number of Visits 17   Date for SLP Re-Evaluation 07/03/16   SLP Start Time 1017   SLP Stop Time  1100   SLP Time Calculation (min) 43 min   Activity Tolerance Patient tolerated treatment well      Past Medical History:  Diagnosis Date  . AICD (automatic cardioverter/defibrillator) present    Abdominal implant was 0064 Guidant lead 6/5 mm  . Anxiety   . Atrial fibrillation ([place])   . CAD (coronary artery disease) 03/2015   a. DESx2 to mid and distal RCA  . Childhood asthma   . Chronic combined systolic and diastolic CHF (congestive heart failure) ([place])    a. ischemic CM - EF 40-45 in 12/16 // b. Echo 12/17: Echo 12/17: EF 50-55, normal wall motion, grade 1 diastolic dysfunction  . Depression   . GERD (gastroesophageal reflux disease)   . History of hiatal hernia   . HLD (hyperlipidemia)   . Hypokalemia    Recurrent associated with cardiac arrest  . Hypothyroidism   . Leg pain    ABIs 12/17: normal   . Sudden cardiac death ([place]) 1994   aborted  . Thyroid disease   . Tobacco abuse    02-16-14 past hx. heavy smoker, quit < 75yr.    Past Surgical History:  Procedure Laterality Date  . CARDIAC CATHETERIZATION N/A 04/08/2015   Procedure: Left Heart Cath and Coronary Angiography;  Surgeon: Burnell Blanks, MD;  Location: [facility];  Service: Cardiovascular;  Laterality: N/A;  . CARDIAC CATHETERIZATION N/A 04/08/2015   Procedure: Coronary Stent Intervention;  Surgeon: Burnell Blanks, MD;  Location: [facility];  Service: Cardiovascular;  Laterality: N/A;  .  CARDIAC DEFIBRILLATOR PLACEMENT  1991   guidant  . EUS N/A 02/26/2014   Procedure: ESOPHAGEAL ENDOSCOPIC ULTRASOUND (EUS) RADIAL;  Surgeon: Milus Banister, MD;  Location: [facility];  Service: Endoscopy;  Laterality: N/A;  radial linear  . HAND SURGERY Left 1965   "got it caught in a conveyor belt"    There were no vitals filed for this visit.      Subjective Assessment - 05/29/16 1020    Subjective "I get short of breath"                ADULT SLP TREATMENT - 05/29/16 0001      General Information   Behavior/Cognition Alert;Cooperative;Pleasant mood   Patient Positioning Upright in chair     Treatment Provided   Treatment provided Cognitive-Linquistic     Pain Assessment   Pain Assessment 0-10   Pain Score 2    Pain Location back of legs and back   Pain Descriptors / Indicators Aching  muscle pain from moving furniture   Pain Intervention(s) Premedicated before session     Cognitive-Linquistic Treatment   Treatment focused on Voice;Dysarthria   Skilled Treatment SLP used loud /a/ and perceived effort level of 8 to recalibrate patient's loudness in conversation. Patient continues to complete HEP daily. Initially required min-mod A for breath support and loudness for sustained phonation and sentence-level tasks. SLP able to fade cues for  pt independence. Progressed to paragraph level task with mod A and verbal cues to reduce strain, slow rate increase frequency and depth of breath. Patient produced loud /a/ average 85 dB, sentence level tasks 75.4 dB avg, paragraph level 73.7 dB avg. Spontaneous responses 65.2 dB, improved with verbal cues to 72.4 dB. Paragraph level task for abdominal breathing assigned for home exercise.      Assessment / Recommendations / Plan   Plan Continue with current plan of care     Progression Toward Goals   Progression toward goals Progressing toward goals          SLP Education - 05/29/16 1105    Education provided Yes   Education  Details abdominal breathing, slowed rate, vocal loudness   Person(s) Educated Patient   Methods Explanation;Demonstration;Verbal cues;Handout   Comprehension Verbalized understanding;Returned demonstration          SLP Short Term Goals - 05/29/16 1106      SLP SHORT TERM GOAL #1   Title pt will produce loud /a/ with average >80db over 4 sessions   Baseline 1.29.18, 2.12.18, 2.19.18   Time 1   Period Weeks   Status On-going     SLP SHORT TERM GOAL #2   Title Pt will demonstrate average of >70dB during structured sentence level speech tasks with rare min A over 2 sessions   Baseline 1.29.18, 2.1.18   Status Achieved     SLP SHORT TERM GOAL #3   Title pt will produce speech >70 dB average while using AB for good vocal quality at the sentence level with rare min A over 2 sessions   Baseline 2.12.18, 2.19.18   Time 1   Period Weeks   Status Achieved     SLP SHORT TERM GOAL #4   Title pt will generate 8 minutes simple to mod complex conversation at average >70dB with min nonverbal cues, over two sessions   Time 1   Period Weeks   Status On-going          SLP Long Term Goals - 05/29/16 1108      SLP LONG TERM GOAL #1   Title Pt will average 70dB over 15 minute mildly complex conversation with supervision cues   Time 5   Period Weeks   Status On-going     SLP LONG TERM GOAL #2   Title Pt will converse audibly in noisy environment/outside of therapy room over 15 minutes with supervision cues.   Time 5   Period Weeks   Status On-going          Plan - 05/29/16 1106    Clinical Impression Statement Patient continues to display mild-moderate hypokinetic dysarthria with reduced vocal amplitude and conversational loudness. Patient is demonstrating improvements in use of instructed techniques to improve vocal quality. He benefitted from additional instruction and skilled feedback in abdominal breathing techniques to improve vocal quality. He will benefit from continued  skilled ST intervention to improve independence and carryover of vocal loudness/effort, quality and AB to increased length of utterance and conversation in order to improve intelligibility and overall communicative function.   Speech Therapy Frequency 2x / week   Duration Other (comment)   Treatment/Interventions Cueing hierarchy;SLP instruction and feedback;Compensatory strategies;Patient/family education;Functional tasks   Potential to Achieve Goals Good   Potential Considerations Medical prognosis   SLP Home Exercise Plan abdominal breathing, paragraph level tasks with AB   Consulted and Agree with Plan of Care Patient      Patient will benefit  from skilled therapeutic intervention in order to improve the following deficits and impairments:   Dysarthria and anarthria    Problem List Patient Active Problem List   Diagnosis Date Noted  . Coronary artery disease involving native heart without angina pectoris 03/07/2016  . Chronic combined systolic and diastolic CHF (congestive heart failure) ([place]) 03/07/2016  . Chest pain 09/14/2015  . Rib fracture 09/12/2015  . Abrasion of right arm 09/12/2015  . Foot swelling, Left Ankel  12/15/2014  . Discoloration of skin 12/15/2014  . PVD (peripheral vascular disease) with claudication ([place]) 11/20/2014  . Hyperlipidemia 10/13/2014  . Hypothyroidism 10/13/2014  . Leg cramps 10/13/2014  . Physical exam 04/13/2014  . Duodenal diverticulum 02/26/2014  . Bile duct abnormality 02/26/2014  . Special screening for malignant neoplasms, colon 01/30/2014  . Belching 01/30/2014  . Elevated LFTs 01/30/2014  . Flatulence/gas pain/belching 12/04/2013  . Abdominal distension 12/04/2013  . GERD (gastroesophageal reflux disease) 10/08/2013  . Ventricular fibrillation ([place]) 07/26/2010  . Implantable defibrillator-[employer] 07/26/2010  . TOBACCO ABUSE 06/08/2009  Deneise Lever, MS CF-SLP Speech-Language Pathologist   Aliene Altes 05/29/2016, 11:08 AM  [facility] [address] [place], [place], [address] Phone: [phone]   Fax:  [phone]   Name: Jeremy Walker MRN: [id number] Date of Birth: 04/06/1951

## 2016-05-29 NOTE — Therapy (Signed)
[facility] [address] [place], [place], [address] Phone: [phone]   Fax:  [phone]  Physical Therapy Treatment  Patient Details  Name: Jeremy Walker MRN: [id number] Date of Birth: 01/08/1951 Referring Provider: Alonza Bogus  Encounter Date: 05/29/2016      PT End of Session - 05/29/16 0939    Visit Number 6   Number of Visits 9   Date for PT Re-Evaluation 06/29/16   Authorization Type [employer]-30 visit limit for PT   Authorization - Visit Number 3   Authorization - Number of Visits 30   PT Start Time 0932   PT Stop Time 1015   PT Time Calculation (min) 43 min   Activity Tolerance Patient tolerated treatment well   Behavior During Therapy [facility] for tasks assessed/performed      Past Medical History:  Diagnosis Date  . AICD (automatic cardioverter/defibrillator) present    Abdominal implant was 0064 Guidant lead 6/5 mm  . Anxiety   . Atrial fibrillation ([place])   . CAD (coronary artery disease) 03/2015   a. DESx2 to mid and distal RCA  . Childhood asthma   . Chronic combined systolic and diastolic CHF (congestive heart failure) ([place])    a. ischemic CM - EF 40-45 in 12/16 // b. Echo 12/17: Echo 12/17: EF 50-55, normal wall motion, grade 1 diastolic dysfunction  . Depression   . GERD (gastroesophageal reflux disease)   . History of hiatal hernia   . HLD (hyperlipidemia)   . Hypokalemia    Recurrent associated with cardiac arrest  . Hypothyroidism   . Leg pain    ABIs 12/17: normal   . Sudden cardiac death ([place]) 1994   aborted  . Thyroid disease   . Tobacco abuse    02-16-14 past hx. heavy smoker, quit < 75yr    Past Surgical History:  Procedure Laterality Date  . CARDIAC CATHETERIZATION N/A 04/08/2015   Procedure: Left Heart Cath and Coronary Angiography;  Surgeon: CBurnell Blanks MD;  [facility];  Service: Cardiovascular;  Laterality: N/A;  . CARDIAC CATHETERIZATION N/A  04/08/2015   Procedure: Coronary Stent Intervention;  Surgeon: CBurnell Blanks MD;  [facility];  Service: Cardiovascular;  Laterality: N/A;  . CARDIAC DEFIBRILLATOR PLACEMENT  1991   guidant  . EUS N/A 02/26/2014   Procedure: ESOPHAGEAL ENDOSCOPIC ULTRASOUND (EUS) RADIAL;  Surgeon: DMilus Banister MD;  [facility];  Service: Endoscopy;  Laterality: N/A;  radial linear  . HAND SURGERY Left 1965   "got it caught in a conveyor belt"    There were no vitals filed for this visit.      Subjective Assessment - 05/29/16 0934    Subjective Reports he is sore today from moving furniture/cleaning house over the weekend. Dizziness was better over the weekend. Did have a fall on Wednesday (05/24/16) when walking on a grassy hill. He was trying to pull a limb out of the way and lost his balance. Fell down and was at bottom of hill. Denies any injuries. Was able to manuever himself so his head was going up the hill and then worked his way up with time. No issues with walking back up the hill.                          Patient Stated Goals Pt's goal for therapy is to get balance back and be able to walk better.  Currently in Pain? No/denies   Multiple Pain Sites No            [facility] PT Assessment - 05/29/16 0941      Transfers   Sit to Stand 6: Modified independent (Device/Increase time)   Stand to Sit 6: Modified independent (Device/Increase time)   Number of Reps 10 reps;2 sets   Comments from an 18 inch high chair without UE support, cues for full upright posture with standing on first 2 reps;  14 inch box with gray cushion on it to simulate low sofa, x 10 reps with no UE support, cues for full upright standing                               Functional Gait  Assessment   Gait assessed  Yes   Gait Level Surface Walks 20 ft in less than 5.5 sec, no assistive devices, good speed, no evidence for imbalance, normal gait pattern, deviates no more than 6 in outside  of the 12 in walkway width.  4.50 sec's   Change in Gait Speed Able to smoothly change walking speed without loss of balance or gait deviation. Deviate no more than 6 in outside of the 12 in walkway width.   Gait with Horizontal Head Turns Performs head turns smoothly with slight change in gait velocity (eg, minor disruption to smooth gait path), deviates 6-10 in outside 12 in walkway width, or uses an assistive device.   Gait with Vertical Head Turns Performs head turns with no change in gait. Deviates no more than 6 in outside 12 in walkway width.   Gait and Pivot Turn Pivot turns safely within 3 sec and stops quickly with no loss of balance.   Step Over Obstacle Is able to step over one shoe box (4.5 in total height) without changing gait speed. No evidence of imbalance.   Gait with Narrow Base of Support Ambulates 7-9 steps.   Gait with Eyes Closed Walks 20 ft, no assistive devices, good speed, no evidence of imbalance, normal gait pattern, deviates no more than 6 in outside 12 in walkway width. Ambulates 20 ft in less than 7 sec.   Ambulating Backwards Walks 20 ft, uses assistive device, slower speed, mild gait deviations, deviates 6-10 in outside 12 in walkway width.   Steps Alternating feet, must use rail.   Total Score 25   FGA comment: 25/30= low risk for falls            [facility] Adult PT Treatment/Exercise - 05/29/16 0941      Self-Care   Other Self-Care Comments  issued fall prevention strategies for home today after review in session     Neuro Re-ed    Neuro Re-ed Details  single leg stance at counter top: max time on right for 5 sec's and max time on left for 7 sec's before needing support on coutner top again. supervision for safety.             PT Education - 05/29/16 1003    Education provided Yes   Education Details fall prevention strategies   Person(s) Educated Patient   Methods Explanation;Demonstration;Handout;Verbal cues   Comprehension Verbalized  understanding;Returned demonstration;Verbal cues required;Need further instruction             PT Long Term Goals - 05/29/16 0940      PT LONG TERM GOAL #1   Title Pt will be independent with  HEP for improved balance, transfers, and gait.  TARGET 05/31/16   Time 4   Period Weeks   Status On-going     PT LONG TERM GOAL #2   Title Pt will perform sit<>stand transfers from <18" surfaces, 8 of 10 reps, independently, for improved transfer efficiency and safety.   Baseline 05/29/16: met today   Time --   Period --   Status Achieved     PT LONG TERM GOAL #3   Title Pt will improve Functional Gait Assessment to at least 20/30 for decreased fall risk.   Baseline 05/29/16: 25/30 scored today   Time --   Period --   Status Achieved     PT LONG TERM GOAL #4   Title Pt will improve SLS to at least 3 seconds each leg, for improved stair/obstacle negotiation.   Baseline 05/29/16: met today   Time --   Period --   Status Achieved     PT LONG TERM GOAL #5   Title Pt will verbalize understanding of local Parkinson's disease resources, including exercise options for continued community fitness upon D/C from PT.   Time 4   Period Weeks   Status On-going     PT LONG TERM GOAL #6   Title Pt will verbalize understanding of fall prevention in the home environment.     Baseline 05/29/16: information reviewed and issued today.   Time 4   Period Weeks   Status On-going           Plan - 05/29/16 1975    Clinical Impression Statement today's skilled session focused checking progress toward LTGs. Pt has met 3/6 goals to date. Will plan to assess remaining goals at next session   Rehab Potential Good   PT Treatment/Interventions ADLs/Self Care Home Management;Functional mobility training;Gait training;Therapeutic activities;Therapeutic exercise;Balance training;Neuromuscular re-education;Patient/family education   PT Next Visit Plan assess remaining LTGs due to end of plan of care    Consulted and Agree with Plan of Care Patient      Patient will benefit from skilled therapeutic intervention in order to improve the following deficits and impairments:  Abnormal gait, Decreased balance, Decreased mobility, Decreased strength, Difficulty walking, Postural dysfunction  Visit Diagnosis: Other abnormalities of gait and mobility  Unsteadiness on feet  Abnormal posture     Problem List Patient Active Problem List   Diagnosis Date Noted  . Coronary artery disease involving native heart without angina pectoris 03/07/2016  . Chronic combined systolic and diastolic CHF (congestive heart failure) ([place]) 03/07/2016  . Chest pain 09/14/2015  . Rib fracture 09/12/2015  . Abrasion of right arm 09/12/2015  . Foot swelling, Left Ankel  12/15/2014  . Discoloration of skin 12/15/2014  . PVD (peripheral vascular disease) with claudication ([place]) 11/20/2014  . Hyperlipidemia 10/13/2014  . Hypothyroidism 10/13/2014  . Leg cramps 10/13/2014  . Physical exam 04/13/2014  . Duodenal diverticulum 02/26/2014  . Bile duct abnormality 02/26/2014  . Special screening for malignant neoplasms, colon 01/30/2014  . Belching 01/30/2014  . Elevated LFTs 01/30/2014  . Flatulence/gas pain/belching 12/04/2013  . Abdominal distension 12/04/2013  . GERD (gastroesophageal reflux disease) 10/08/2013  . Ventricular fibrillation ([place]) 07/26/2010  . Implantable defibrillator-[employer] 07/26/2010  . TOBACCO ABUSE 06/08/2009    Willow Ora, PTA, [facility] [address], [place], [place] [address] [phone] 05/29/16, 3:11 PM   Name: Jeremy Walker MRN: [id number] Date of Birth: 11-Oct-1950

## 2016-05-29 NOTE — Patient Instructions (Addendum)
Fall Prevention in the Home Introduction Falls can cause injuries. They can happen to people of all ages. There are many things you can do to make your home safe and to help prevent falls. What can I do on the outside of my home?  Regularly fix the edges of walkways and driveways and fix any cracks.  Remove anything that might make you trip as you walk through a door, such as a raised step or threshold.  Trim any bushes or trees on the path to your home.  Use bright outdoor lighting.  Clear any walking paths of anything that might make someone trip, such as rocks or tools.  Regularly check to see if handrails are loose or broken. Make sure that both sides of any steps have handrails.  Any raised decks and porches should have guardrails on the edges.  Have any leaves, snow, or ice cleared regularly.  Use sand or salt on walking paths during winter.  Clean up any spills in your garage right away. This includes oil or grease spills. What can I do in the bathroom?  Use night lights.  Install grab bars by the toilet and in the tub and shower. Do not use towel bars as grab bars.  Use non-skid mats or decals in the tub or shower.  If you need to sit down in the shower, use a plastic, non-slip stool.  Keep the floor dry. Clean up any water that spills on the floor as soon as it happens.  Remove soap buildup in the tub or shower regularly.  Attach bath mats securely with double-sided non-slip rug tape.  Do not have throw rugs and other things on the floor that can make you trip. What can I do in the bedroom?  Use night lights.  Make sure that you have a light by your bed that is easy to reach.  Do not use any sheets or blankets that are too big for your bed. They should not hang down onto the floor.  Have a firm chair that has side arms. You can use this for support while you get dressed.  Do not have throw rugs and other things on the floor that can make you trip. What can  I do in the kitchen?  Clean up any spills right away.  Avoid walking on wet floors.  Keep items that you use a lot in easy-to-reach places.  If you need to reach something above you, use a strong step stool that has a grab bar.  Keep electrical cords out of the way.  Do not use floor polish or wax that makes floors slippery. If you must use wax, use non-skid floor wax.  Do not have throw rugs and other things on the floor that can make you trip. What can I do with my stairs?  Do not leave any items on the stairs.  Make sure that there are handrails on both sides of the stairs and use them. Fix handrails that are broken or loose. Make sure that handrails are as long as the stairways.  Check any carpeting to make sure that it is firmly attached to the stairs. Fix any carpet that is loose or worn.  Avoid having throw rugs at the top or bottom of the stairs. If you do have throw rugs, attach them to the floor with carpet tape.  Make sure that you have a light switch at the top of the stairs and the bottom of the stairs. If you   do not have them, ask someone to add them for you. What else can I do to help prevent falls?  Wear shoes that:  Do not have high heels.  Have rubber bottoms.  Are comfortable and fit you well.  Are closed at the toe. Do not wear sandals.  If you use a stepladder:  Make sure that it is fully opened. Do not climb a closed stepladder.  Make sure that both sides of the stepladder are locked into place.  Ask someone to hold it for you, if possible.  Clearly mark and make sure that you can see:  Any grab bars or handrails.  First and last steps.  Where the edge of each step is.  Use tools that help you move around (mobility aids) if they are needed. These include:  Canes.  Walkers.  Scooters.  Crutches.  Turn on the lights when you go into a dark area. Replace any light bulbs as soon as they burn out.  Set up your furniture so you have a  clear path. Avoid moving your furniture around.  If any of your floors are uneven, fix them.  If there are any pets around you, be aware of where they are.  Review your medicines with your doctor. Some medicines can make you feel dizzy. This can increase your chance of falling. Ask your doctor what other things that you can do to help prevent falls. This information is not intended to replace advice given to you by your health care provider. Make sure you discuss any questions you have with your health care provider. Document Released: 01/21/2009 Document Revised: 09/02/2015 Document Reviewed: 05/01/2014  2017 [employer]  

## 2016-06-01 ENCOUNTER — Ambulatory Visit: Payer: BLUE CROSS/BLUE SHIELD | Admitting: Physical Therapy

## 2016-06-01 ENCOUNTER — Ambulatory Visit: Payer: BLUE CROSS/BLUE SHIELD | Admitting: Speech Pathology

## 2016-06-01 DIAGNOSIS — R2681 Unsteadiness on feet: Secondary | ICD-10-CM

## 2016-06-01 DIAGNOSIS — R2689 Other abnormalities of gait and mobility: Secondary | ICD-10-CM

## 2016-06-01 DIAGNOSIS — R471 Dysarthria and anarthria: Secondary | ICD-10-CM

## 2016-06-01 DIAGNOSIS — R293 Abnormal posture: Secondary | ICD-10-CM

## 2016-06-01 NOTE — Therapy (Signed)
[facility] [address] [place], [place], [address] Phone: [phone]   Fax:  [phone]  Speech Language Pathology Treatment  Patient Details  Name: Jeremy Walker MRN: [id number] Date of Birth: 1950-06-12 Referring Provider: Alonza Bogus, DO  Encounter Date: 06/01/2016      End of Session - 06/01/16 1415    Visit Number 7   Number of Visits 17   Date for SLP Re-Evaluation 07/03/16   SLP Start Time 53   SLP Stop Time  1059   SLP Time Calculation (min) 41 min   Activity Tolerance Patient tolerated treatment well      Past Medical History:  Diagnosis Date  . AICD (automatic cardioverter/defibrillator) present    Abdominal implant was 0064 Guidant lead 6/5 mm  . Anxiety   . Atrial fibrillation ([place])   . CAD (coronary artery disease) 03/2015   a. DESx2 to mid and distal RCA  . Childhood asthma   . Chronic combined systolic and diastolic CHF (congestive heart failure) ([place])    a. ischemic CM - EF 40-45 in 12/16 // b. Echo 12/17: Echo 12/17: EF 50-55, normal wall motion, grade 1 diastolic dysfunction  . Depression   . GERD (gastroesophageal reflux disease)   . History of hiatal hernia   . HLD (hyperlipidemia)   . Hypokalemia    Recurrent associated with cardiac arrest  . Hypothyroidism   . Leg pain    ABIs 12/17: normal   . Sudden cardiac death ([place]) 1994   aborted  . Thyroid disease   . Tobacco abuse    02-16-14 past hx. heavy smoker, quit < 49yr.    Past Surgical History:  Procedure Laterality Date  . CARDIAC CATHETERIZATION N/A 04/08/2015   Procedure: Left Heart Cath and Coronary Angiography;  Surgeon: Burnell Blanks, MD;  Location: [facility];  Service: Cardiovascular;  Laterality: N/A;  . CARDIAC CATHETERIZATION N/A 04/08/2015   Procedure: Coronary Stent Intervention;  Surgeon: Burnell Blanks, MD;  Location: [facility];  Service: Cardiovascular;  Laterality: N/A;  .  CARDIAC DEFIBRILLATOR PLACEMENT  1991   guidant  . EUS N/A 02/26/2014   Procedure: ESOPHAGEAL ENDOSCOPIC ULTRASOUND (EUS) RADIAL;  Surgeon: Milus Banister, MD;  [facility];  Service: Endoscopy;  Laterality: N/A;  radial linear  . HAND SURGERY Left 1965   "got it caught in a conveyor belt"    There were no vitals filed for this visit.      Subjective Assessment - 06/01/16 1022    Subjective "I have to tell myself to stop and take a breath. I need to teach myself to do that."   Currently in Pain? No/denies               ADULT SLP TREATMENT - 06/01/16 1101      General Information   Behavior/Cognition Alert;Cooperative;Pleasant mood   Patient Positioning Upright in chair     Treatment Provided   Treatment provided Cognitive-Linquistic     Pain Assessment   Pain Assessment No/denies pain     Cognitive-Linquistic Treatment   Treatment focused on Voice;Dysarthria   Skilled Treatment SLP used loud /a/ to recalibrate patient's loudness in hierarchy tasks and conversation. Patient continues to complete HEP daily. Required min A for breath support and loudness for sustained phonation and sentence-level tasks. SLP able to fade cues for pt independence. Progressed to short conversational response task with mod A and verbal cues to reduce strain, slow rate  increase frequency and depth of breath. Patient produced loud /a/ average 85 dB, sentence level tasks 77.7 dB avg, short open ended responses level 75.7 dB avg. Spontaneous responses 66.8 dB, improved with min visual cues to 71.4 dB. Paragraph level task for abdominal breathing assigned for home exercise.      Assessment / Recommendations / Plan   Plan Continue with current plan of care     Progression Toward Goals   Progression toward goals Progressing toward goals          SLP Education - 06/01/16 1415    Education provided Yes   Education Details abdominal breathing, breath pacing, vocal loudness   Person(s)  Educated Patient   Methods Explanation;Demonstration;Verbal cues;Tactile cues   Comprehension Verbalized understanding;Returned demonstration;Need further instruction          SLP Short Term Goals - 06/01/16 1416      SLP SHORT TERM GOAL #1   Title pt will produce loud /a/ with average >80db over 4 sessions   Baseline 1.29.18, 2.12.18, 2.19.18, 2.22.18   Time 1   Period Weeks   Status Achieved     SLP SHORT TERM GOAL #2   Title Pt will demonstrate average of >70dB during structured sentence level speech tasks with rare min A over 2 sessions   Baseline 1.29.18, 2.1.18   Time 1   Period Weeks   Status Achieved     SLP SHORT TERM GOAL #3   Title pt will produce speech >70 dB average while using AB for good vocal quality at the sentence level with rare min A over 2 sessions   Baseline 2.12.18, 2.19.18   Time 1   Period Weeks   Status Achieved     SLP SHORT TERM GOAL #4   Title pt will generate 8 minutes simple to mod complex conversation at average >70dB with min nonverbal cues, over two sessions   Baseline 2.22.18   Time 1   Period Weeks   Status On-going          SLP Long Term Goals - 06/01/16 1419      SLP LONG TERM GOAL #1   Title Pt will average 70dB over 15 minute mildly complex conversation with supervision cues   Time 5   Period Weeks   Status On-going     SLP LONG TERM GOAL #2   Title Pt will converse audibly in noisy environment/outside of therapy room over 15 minutes with supervision cues.   Time 5   Period Weeks   Status On-going          Plan - 06/01/16 1416    Clinical Impression Statement Patient continues to display mild-moderate hypokinetic dysarthria with reduced vocal amplitude and conversational loudness. Patient is demonstrating improvements in use of instructed techniques to improve vocal quality. He benefitted from additional instruction and skilled feedback in abdominal breathing techniques to improve vocal quality. He will benefit from  continued skilled ST intervention to improve independence and carryover of vocal loudness/effort, quality and AB to increased length of utterance and conversation in order to improve intelligibility and overall communicative function.   Speech Therapy Frequency 2x / week   Duration Other (comment)   Treatment/Interventions Cueing hierarchy;SLP instruction and feedback;Compensatory strategies;Patient/family education;Functional tasks   Potential to Achieve Goals Good   Potential Considerations Medical prognosis   SLP Home Exercise Plan abdominal breathing, paragraph level tasks with AB      Patient will benefit from skilled therapeutic intervention in order to improve  the following deficits and impairments:   Dysarthria and anarthria    Problem List Patient Active Problem List   Diagnosis Date Noted  . Coronary artery disease involving native heart without angina pectoris 03/07/2016  . Chronic combined systolic and diastolic CHF (congestive heart failure) ([place]) 03/07/2016  . Chest pain 09/14/2015  . Rib fracture 09/12/2015  . Abrasion of right arm 09/12/2015  . Foot swelling, Left Ankel  12/15/2014  . Discoloration of skin 12/15/2014  . PVD (peripheral vascular disease) with claudication ([place]) 11/20/2014  . Hyperlipidemia 10/13/2014  . Hypothyroidism 10/13/2014  . Leg cramps 10/13/2014  . Physical exam 04/13/2014  . Duodenal diverticulum 02/26/2014  . Bile duct abnormality 02/26/2014  . Special screening for malignant neoplasms, colon 01/30/2014  . Belching 01/30/2014  . Elevated LFTs 01/30/2014  . Flatulence/gas pain/belching 12/04/2013  . Abdominal distension 12/04/2013  . GERD (gastroesophageal reflux disease) 10/08/2013  . Ventricular fibrillation ([place]) 07/26/2010  . Implantable defibrillator-[employer] 07/26/2010  . TOBACCO ABUSE 06/08/2009   Deneise Lever, MS CF-SLP Speech-Language Pathologist  Aliene Altes 06/01/2016, 2:20 PM  [facility] [address] [place], [place], [address] Phone: [phone]   Fax:  [phone]   Name: Jeremy Walker MRN: [id number] Date of Birth: 03-23-1951

## 2016-06-02 NOTE — Therapy (Signed)
[facility] [address] [place], [place], [address] Phone: [phone]   Fax:  [phone]  Physical Therapy Treatment  Patient Details  Name: Jeremy Walker MRN: [id number] Date of Birth: [birth year] Referring Provider: Alonza Bogus  Encounter Date: 06/01/2016      PT End of Session - 06/02/16 0916    Visit Number 7   Number of Visits 15  Per recert 3/79/02   Date for PT Re-Evaluation 07/31/16   Authorization Type [employer]-30 visit limit for PT   Authorization - Visit Number 7   Authorization - Number of Visits 30   PT Start Time 1106   PT Stop Time 1147   PT Time Calculation (min) 41 min   Activity Tolerance Patient tolerated treatment well   Behavior During Therapy [facility] for tasks assessed/performed      Past Medical History:  Diagnosis Date  . AICD (automatic cardioverter/defibrillator) present    Abdominal implant was 0064 Guidant lead 6/5 mm  . Anxiety   . Atrial fibrillation ([place])   . CAD (coronary artery disease) 03/2015   a. DESx2 to mid and distal RCA  . Childhood asthma   . Chronic combined systolic and diastolic CHF (congestive heart failure) ([place])    a. ischemic CM - EF 40-45 in 12/16 // b. Echo 12/17: Echo 12/17: EF 50-55, normal wall motion, grade 1 diastolic dysfunction  . Depression   . GERD (gastroesophageal reflux disease)   . History of hiatal hernia   . HLD (hyperlipidemia)   . Hypokalemia    Recurrent associated with cardiac arrest  . Hypothyroidism   . Leg pain    ABIs 12/17: normal   . Sudden cardiac death ([place]) 1994   aborted  . Thyroid disease   . Tobacco abuse    02-16-14 past hx. heavy smoker, quit < 21yr    Past Surgical History:  Procedure Laterality Date  . CARDIAC CATHETERIZATION N/A 04/08/2015   Procedure: Left Heart Cath and Coronary Angiography;  Surgeon: CBurnell Blanks MD;  [facility];  Service: Cardiovascular;  Laterality: N/A;  . CARDIAC  CATHETERIZATION N/A 04/08/2015   Procedure: Coronary Stent Intervention;  Surgeon: CBurnell Blanks MD;  [facility];  Service: Cardiovascular;  Laterality: N/A;  . CARDIAC DEFIBRILLATOR PLACEMENT  1991   guidant  . EUS N/A 02/26/2014   Procedure: ESOPHAGEAL ENDOSCOPIC ULTRASOUND (EUS) RADIAL;  Surgeon: DMilus Banister MD;  [facility];  Service: Endoscopy;  Laterality: N/A;  radial linear  . HAND SURGERY Left 1965   "got it caught in a conveyor belt"    There were no vitals filed for this visit.      Subjective Assessment - 06/01/16 1108    Subjective Feels that his balance is horrible, tripped and almost fell over my dog this morning.  Caught myself before I fell.  Wonders if a change in his medication would help.   Patient Stated Goals Pt's goal for therapy is to get balance back and be able to walk better.    Currently in Pain? No/denies            [facility] - 06[facility]16 0911      Ambulation/Gait   Ambulation/Gait Yes   Ambulation/Gait Assistance 5: Supervision   Ambulation Distance (Feet) 200 Feet   Assistive device None   Gait Pattern Step-through pattern;Decreased arm swing - right;Decreased arm swing - left;Decreased stride length;Decreased trunk rotation;Trunk flexed   Ambulation Surface  Level;Indoor   Gait velocity 9.32 sec =3.52 ft/sec     Timed Up and Go Test   Normal TUG (seconds) 10.69     High Level Balance   High Level Balance Comments MiniBESTest:  17/28 (See note for details)-Scores <22/28 indicate increased fall risk        Mini-BESTest: Balance Evaluation Systems Test  2005-2013 [employer]. All rights reserved. ________________________________________________________________________________________Anticipatory_________Subscore__3___/6 1. SIT TO STAND Instruction: "Cross your arms across your chest. Try not to use your hands unless you must.Do not let your legs lean against the  back of the chair when you stand. Please stand up now." X(2) Normal: Comes to stand without use of hands and stabilizes independently. (1) Moderate: Comes to stand WITH use of hands on first attempt. (0) Severe: Unable to stand up from chair without assistance, OR needs several attempts with use of hands. 2. RISE TO TOES Instruction: "Place your feet shoulder width apart. Place your hands on your hips. Try to rise as high as you can onto your toes. I will count out loud to 3 seconds. Try to hold this pose for at least 3 seconds. Look straight ahead. Rise now." (2) Normal: Stable for 3 s with maximum height. (1) Moderate: Heels up, but not full range (smaller than when holding hands), OR noticeable instability for 3 s. X(0) Severe: < 3 s. 3. STAND ON ONE LEG Instruction: "Look straight ahead. Keep your hands on your hips. Lift your leg off of the ground behind you without touching or resting your raised leg upon your other standing leg. Stay standing on one leg as long as you can. Look straight ahead. Lift now." Left: Time in Seconds Trial 1:___1.62__Trial 2:___1.31__ (2) Normal: 20 s. (1) Moderate: < 20 s. X(0) Severe: Unable. Right: Time in Seconds Trial 1:_1.72____Trial 2:__1.47___ (2) Normal: 20 s. (1) Moderate: < 20 s. X(0) Severe: Unable To score each side separately use the trial with the longest time. To calculate the sub-score and total score use the side [left or right] with the lowest numerical score [i.e. the worse side]. ______________________________________________________________________________________Reactive Postural Control___________Subscore:___3__/6 4. COMPENSATORY STEPPING CORRECTION- FORWARD Instruction: "Stand with your feet shoulder width apart, arms at your sides. Lean forward against my hands beyond your forward limits. When I let go, do whatever is necessary, including taking a step, to avoid a fall." (2) Normal: Recovers independently with a single, large step  (second realignment step is allowed). X(1) Moderate: More than one step used to recover equilibrium. (0) Severe: No step, OR would fall if not caught, OR falls spontaneously. 5. COMPENSATORY STEPPING CORRECTION- BACKWARD Instruction: "Stand with your feet shoulder width apart, arms at your sides. Lean backward against my hands beyond your backward limits. When I let go, do whatever is necessary, including taking a step, to avoid a fall." (2) Normal: Recovers independently with a single, large step. X(1) Moderate: More than one step used to recover equilibrium. (0) Severe: No step, OR would fall if not caught, OR falls spontaneously. 6. COMPENSATORY STEPPING CORRECTION- LATERAL Instruction: "Stand with your feet together, arms down at your sides. Lean into my hand beyond your sideways limit. When I let go, do whatever is necessary, including taking a step, to avoid a fall." Left (2) Normal: Recovers independently with 1 step (crossover or lateral OK). X(1) Moderate: Several steps to recover equilibrium. (0) Severe: Falls, or cannot step. Right (2) Normal: Recovers independently with 1 step (crossover or lateral OK). X(1) Moderate: Several steps to recover  equilibrium. (0) Severe: Falls, or cannot step. Use the side with the lowest score to calculate sub-score and total score. ____________________________________________________________________________________Sensory Orientation_____________Subscore:_____4____/6 7. STANCE (FEET TOGETHER); EYES OPEN, FIRM SURFACE Instruction: "Place your hands on your hips. Place your feet together until almost touching. Look straight ahead. Be as stable and still as possible, until I say stop." Time in seconds:________ X(2) Normal: 30 s. (1) Moderate: < 30 s. (0) Severe: Unable. 8. STANCE (FEET TOGETHER); EYES CLOSED, FOAM SURFACE Instruction: "Step onto the foam. Place your hands on your hips. Place your feet together until almost touching. Be as  stable and still as possible, until I say stop. I will start timing when you close your eyes." Time in seconds:____9.16 sec____ (2) Normal: 30 s. X(1) Moderate: < 30 s. (0) Severe: Unable. 9. INCLINE- EYES CLOSED Instruction: "Step onto the incline ramp. Please stand on the incline ramp with your toes toward the top. Place your feet shoulder width apart and have your arms down at your sides. I will start timing when you close your eyes." Time in seconds:__25 sec______ (2) Normal: Stands independently 30 s and aligns with gravity. X(1) Moderate: Stands independently <30 s OR aligns with surface. (0) Severe: Unable. _________________________________________________________________________________________Dynamic Gait ______Subscore____7____/10 10. CHANGE IN GAIT SPEED Instruction: "Begin walking at your normal speed, when I tell you 'fast', walk as fast as you can. When I say 'slow', walk very slowly." X(2) Normal: Significantly changes walking speed without imbalance. (1) Moderate: Unable to change walking speed or signs of imbalance. (0) Severe: Unable to achieve significant change in walking speed AND signs of imbalance. [place] - HORIZONTAL Instruction: "Begin walking at your normal speed, when I say "right", turn your head and look to the right. When I say "left" turn your head and look to the left. Try to keep yourself walking in a straight line." (2) Normal: performs head turns with no change in gait speed and good balance. X(1) Moderate: performs head turns with reduction in gait speed. (0) Severe: performs head turns with imbalance. 12. WALK WITH PIVOT TURNS Instruction: "Begin walking at your normal speed. When I tell you to 'turn and stop', turn as quickly as you can, face the opposite direction, and stop. After the turn, your feet should be close together." X(2) Normal: Turns with feet close FAST (< 3 steps) with good balance. (1) Moderate: Turns with feet  close SLOW (>4 steps) with good balance. (0) Severe: Cannot turn with feet close at any speed without imbalance. 13. STEP OVER OBSTACLES Instruction: "Begin walking at your normal speed. When you get to the box, step over it, not around it and keep walking." (2) Normal: Able to step over box with minimal change of gait speed and with good balance. X(1) Moderate: Steps over box but touches box OR displays cautious behavior by slowing gait. (0) Severe: Unable to step over box OR steps around box. 14. TIMED UP & GO WITH DUAL TASK [3 METER WALK] Instruction TUG: "When I say 'Go', stand up from chair, walk at your normal speed across the tape on the floor, turn around, and come back to sit in the chair." Instruction TUG with Dual Task: "Count backwards by threes starting at ___. When I say 'Go', stand up from chair, walk at your normal speed across the tape on the floor, turn around, and come back to sit in the chair. Continue counting backwards the entire time." TUG: __10.69______seconds; Dual Task TUG: ___12.88____seconds (2) Normal: No noticeable change  in sitting, standing or walking while backward counting when compared to TUG without Dual Task. X(1) Moderate: Dual Task affects either counting OR walking (>10%) when compared to the TUG without Dual Task. (0) Severe: Stops counting while walking OR stops walking while counting. When scoring item 14, if subject's gait speed slows more than 10% between the TUG without and with a Dual Task the score should be decreased by a point. TOTAL SCORE: ______22__/28              [facility] Adult PT Treatment/Exercise - 06/02/16 0911      Self-Care   Self-Care Other Self-Care Comments   Other Self-Care Comments  Reviewed fall prevention education provided last visit, with pt verbalizing understanding.  Discussed POC, including progress towards goals, pt's continued reports of difficulty with balance, and wanting to focus on balance.  Discussed  results of MiniBESTest and contributing factors to balance.  Pt in agreement to renew/recert PT to focus on high level balance, gait activities.                     PT Long Term Goals - 06/01/16 1122      PT LONG TERM GOAL #1   Title Pt will be independent with HEP for improved balance, transfers, and gait.  GOAL ONGOING-TARGET 06/30/16   Time 4   Period Weeks   Status On-going     PT LONG TERM GOAL #2   Title Pt will perform sit<>stand transfers from <18" surfaces, 8 of 10 reps, independently, for improved transfer efficiency and safety.   Baseline 05/29/16: met today   Status Achieved     PT LONG TERM GOAL #3   Title Pt will improve Functional Gait Assessment to at least 20/30 for decreased fall risk.   Baseline 05/29/16: 25/30 scored today   Status Achieved     PT LONG TERM GOAL #4   Title Pt will improve SLS to at least 3 seconds each leg, for improved stair/obstacle negotiation.   Baseline 05/29/16: met today; SLS <2 seconds each leg 06/01/16   Status Partially Met     PT LONG TERM GOAL #5   Title Pt will verbalize understanding of local Parkinson's disease resources, including exercise options for continued community fitness upon D/C from PT.  GOAL ONGOING, TARGET 06/30/16   Time 4   Period Weeks   Status On-going     Additional Long Term Goals   Additional Long Term Goals Yes     PT LONG TERM GOAL #6   Title Pt will verbalize understanding of fall prevention in the home environment.     Baseline 05/29/16: information reviewed and issued today.   Time 4   Period Weeks   Status Achieved     PT LONG TERM GOAL #7   Title Pt will improve MiniBESTest score to at least 22/28 for improved overall balance, decreased fall risk.  TARGET 06/30/16   Time 4   Period Weeks   Status New     PT LONG TERM GOAL #8   Title Pt will ambulate at least 1000 ft, indoor and outdoor surfaces, no LOB, independently, for improved outdoor and community gait negotiation.  TARGET  06/30/16   Time 4   Period Weeks   Status New               Plan - 06/02/16 7124    Clinical Impression Statement Assessed remaining goals today.  Pt continues to report balance issues, with a  near fall this morning in the home, stepping over his dog.  MiniBESTest performed, with pt scoring 17/28, indicating fall risk.  Pt will benefit from additional skilled PT to address balance, compliant and variable surface gait for improved mobility and decreased fall risk.  See recert from this visit.  Note modified LTGs-LTG 1, 5, 7 and 8 are ongoing   Rehab Potential Good   PT Frequency 2x / week   PT Duration 4 weeks  per recert 9/93/57   PT Treatment/Interventions ADLs/Self Care Home Management;Functional mobility training;Gait training;Therapeutic activities;Therapeutic exercise;Balance training;Neuromuscular re-education;Patient/family education   PT Next Visit Plan Stepping strategies for balance-add to HEP; compliant surface balance activities and dynamic gait:  forward/back, sidestepping, start/stop/environmental scanning   Consulted and Agree with Plan of Care Patient      Patient will benefit from skilled therapeutic intervention in order to improve the following deficits and impairments:  Abnormal gait, Decreased balance, Decreased mobility, Decreased strength, Difficulty walking, Postural dysfunction  Visit Diagnosis: Unsteadiness on feet  Other abnormalities of gait and mobility  Abnormal posture     Problem List Patient Active Problem List   Diagnosis Date Noted  . Coronary artery disease involving native heart without angina pectoris 03/07/2016  . Chronic combined systolic and diastolic CHF (congestive heart failure) ([place]) 03/07/2016  . Chest pain 09/14/2015  . Rib fracture 09/12/2015  . Abrasion of right arm 09/12/2015  . Foot swelling, Left Ankel  12/15/2014  . Discoloration of skin 12/15/2014  . PVD (peripheral vascular disease) with claudication ([place])  11/20/2014  . Hyperlipidemia 10/13/2014  . Hypothyroidism 10/13/2014  . Leg cramps 10/13/2014  . Physical exam 04/13/2014  . Duodenal diverticulum 02/26/2014  . Bile duct abnormality 02/26/2014  . Special screening for malignant neoplasms, colon 01/30/2014  . Belching 01/30/2014  . Elevated LFTs 01/30/2014  . Flatulence/gas pain/belching 12/04/2013  . Abdominal distension 12/04/2013  . GERD (gastroesophageal reflux disease) 10/08/2013  . Ventricular fibrillation ([place]) 07/26/2010  . Implantable defibrillator-[employer] 07/26/2010  . TOBACCO ABUSE 06/08/2009    Heidie Krall W. 06/02/2016, 9:26 AM  Frazier Butt., PT  [facility] [address] [place], [place], [address] Phone: [phone]   Fax:  [phone]  Name: Jeremy Walker MRN: [id number] Date of Birth: 01/19/1951

## 2016-06-05 ENCOUNTER — Ambulatory Visit: Payer: BLUE CROSS/BLUE SHIELD | Admitting: Speech Pathology

## 2016-06-05 DIAGNOSIS — R471 Dysarthria and anarthria: Secondary | ICD-10-CM

## 2016-06-05 DIAGNOSIS — R2689 Other abnormalities of gait and mobility: Secondary | ICD-10-CM | POA: Diagnosis not present

## 2016-06-05 NOTE — Therapy (Signed)
[facility] [address] [place], [place], [address] Phone: [phone]   Fax:  [phone]  Speech Language Pathology Treatment  Patient Details  Name: Jeremy Walker MRN: [id number] Date of Birth: 1950-10-29 Referring Provider: Alonza Bogus, DO  Encounter Date: 06/05/2016      End of Session - 06/05/16 1432    Visit Number 8   Number of Visits 17   Date for SLP Re-Evaluation 07/03/16      Past Medical History:  Diagnosis Date  . AICD (automatic cardioverter/defibrillator) present    Abdominal implant was 0064 Guidant lead 6/5 mm  . Anxiety   . Atrial fibrillation ([place])   . CAD (coronary artery disease) 03/2015   a. DESx2 to mid and distal RCA  . Childhood asthma   . Chronic combined systolic and diastolic CHF (congestive heart failure) ([place])    a. ischemic CM - EF 40-45 in 12/16 // b. Echo 12/17: Echo 12/17: EF 50-55, normal wall motion, grade 1 diastolic dysfunction  . Depression   . GERD (gastroesophageal reflux disease)   . History of hiatal hernia   . HLD (hyperlipidemia)   . Hypokalemia    Recurrent associated with cardiac arrest  . Hypothyroidism   . Leg pain    ABIs 12/17: normal   . Sudden cardiac death ([place]) 1994   aborted  . Thyroid disease   . Tobacco abuse    02-16-14 past hx. heavy smoker, quit < 10yr.    Past Surgical History:  Procedure Laterality Date  . CARDIAC CATHETERIZATION N/A 04/08/2015   Procedure: Left Heart Cath and Coronary Angiography;  Surgeon: Burnell Blanks, MD;  Location: [facility];  Service: Cardiovascular;  Laterality: N/A;  . CARDIAC CATHETERIZATION N/A 04/08/2015   Procedure: Coronary Stent Intervention;  Surgeon: Burnell Blanks, MD;  Location: [facility];  Service: Cardiovascular;  Laterality: N/A;  . CARDIAC DEFIBRILLATOR PLACEMENT  1991   guidant  . EUS N/A 02/26/2014   Procedure: ESOPHAGEAL ENDOSCOPIC ULTRASOUND (EUS) RADIAL;   Surgeon: Milus Banister, MD;  [facility];  Service: Endoscopy;  Laterality: N/A;  radial linear  . HAND SURGERY Left 1965   "got it caught in a conveyor belt"    There were no vitals filed for this visit.      Subjective Assessment - 06/05/16 0937    Subjective "I was reading that stuff you gave me and it got boring after a while trying to take breaths"   Currently in Pain? No/denies               ADULT SLP TREATMENT - 06/05/16 0938      General Information   Behavior/Cognition Alert;Cooperative;Pleasant mood   Patient Positioning Upright in chair     Treatment Provided   Treatment provided Cognitive-Linquistic     Pain Assessment   Pain Assessment No/denies pain     Cognitive-Linquistic Treatment   Treatment focused on Voice;Dysarthria   Skilled Treatment SLP used loud /a/ to recalibrate patient's loudness in hierarchy tasks and conversation. Patient continues to complete HEP daily. Required min A for breath support and to reduce strain during sustained phonation tasks. SLP able to fade cues for pt independence. Patient produced loud /a/ average 85.1 dB, sentence level tasks 75.7 dB avg. During multiparagraph task pt with 76.6 dB avg, required moderate cues for AB. Spontaneous responses 65.7 dB, improved with min visual cues to 71.3 dB. Multiparagraph level task assigned for home exercise.  Assessment / Recommendations / Plan   Plan Continue with current plan of care     Progression Toward Goals   Progression toward goals Progressing toward goals          SLP Education - 06/05/16 1431    Education provided Yes   Education Details AB to reduce laryngeal tension   Person(s) Educated Patient   Methods Explanation;Demonstration;Verbal cues   Comprehension Verbalized understanding;Returned demonstration          SLP Short Term Goals - 06/05/16 1432      SLP SHORT TERM GOAL #1   Title pt will produce loud /a/ with average >80db over 4 sessions    Status Achieved     SLP SHORT TERM GOAL #2   Title Pt will demonstrate average of >70dB during structured sentence level speech tasks with rare min A over 2 sessions   Status Achieved     SLP SHORT TERM GOAL #3   Title pt will produce speech >70 dB average while using AB for good vocal quality at the sentence level with rare min A over 2 sessions   Baseline 2.12.18, 2.19.18   Status Achieved     SLP SHORT TERM GOAL #4   Title pt will generate 8 minutes simple to mod complex conversation at average >70dB with min nonverbal cues, over two sessions   Baseline 2.22.18   Time 1   Period Weeks   Status On-going     SLP SHORT TERM GOAL #5   Title pt will use AB techniques at multiparagraph level with 85% accuracy and rare min A.   Time 4   Period Weeks   Status New          SLP Long Term Goals - 06/05/16 1434      SLP LONG TERM GOAL #1   Title Pt will average 70dB over 15 minute mildly complex conversation with supervision cues   Time 5   Period Weeks   Status On-going     SLP LONG TERM GOAL #2   Title Pt will converse audibly in noisy environment/outside of therapy room over 15 minutes with supervision cues.   Time 5   Period Weeks   Status On-going          Plan - 06/05/16 1432    Clinical Impression Statement Patient continues to display mild-moderate hypokinetic dysarthria with reduced vocal amplitude and conversational loudness. Patient is demonstrating improvements in use of instructed techniques to improve vocal quality. He benefitted from additional instruction and skilled feedback in abdominal breathing techniques to improve vocal quality. He will benefit from continued skilled ST intervention to improve independence and carryover of vocal loudness/effort, quality and AB to increased length of utterance and conversation in order to improve intelligibility and overall communicative function.   Speech Therapy Frequency 2x / week   Duration Other (comment)    Treatment/Interventions Cueing hierarchy;SLP instruction and feedback;Compensatory strategies;Patient/family education;Functional tasks      Patient will benefit from skilled therapeutic intervention in order to improve the following deficits and impairments:   Dysarthria and anarthria    Problem List Patient Active Problem List   Diagnosis Date Noted  . Coronary artery disease involving native heart without angina pectoris 03/07/2016  . Chronic combined systolic and diastolic CHF (congestive heart failure) ([place]) 03/07/2016  . Chest pain 09/14/2015  . Rib fracture 09/12/2015  . Abrasion of right arm 09/12/2015  . Foot swelling, Left Ankel  12/15/2014  . Discoloration of skin 12/15/2014  .  PVD (peripheral vascular disease) with claudication ([place]) 11/20/2014  . Hyperlipidemia 10/13/2014  . Hypothyroidism 10/13/2014  . Leg cramps 10/13/2014  . Physical exam 04/13/2014  . Duodenal diverticulum 02/26/2014  . Bile duct abnormality 02/26/2014  . Special screening for malignant neoplasms, colon 01/30/2014  . Belching 01/30/2014  . Elevated LFTs 01/30/2014  . Flatulence/gas pain/belching 12/04/2013  . Abdominal distension 12/04/2013  . GERD (gastroesophageal reflux disease) 10/08/2013  . Ventricular fibrillation ([place]) 07/26/2010  . Implantable defibrillator-[employer] 07/26/2010  . TOBACCO ABUSE 06/08/2009   Deneise Lever, MS CF-SLP Speech-Language Pathologist  Aliene Altes 06/05/2016, 2:35 PM  [facility] [address] [place], [place], [address] Phone: [phone]   Fax:  [phone]   Name: Jeremy Walker MRN: [id number] Date of Birth: 21-Jan-1951

## 2016-06-08 ENCOUNTER — Ambulatory Visit: Payer: Medicare HMO | Attending: Neurology | Admitting: Speech Pathology

## 2016-06-08 ENCOUNTER — Ambulatory Visit: Payer: Medicare HMO | Admitting: Physical Therapy

## 2016-06-08 DIAGNOSIS — R2689 Other abnormalities of gait and mobility: Secondary | ICD-10-CM | POA: Diagnosis not present

## 2016-06-08 DIAGNOSIS — R471 Dysarthria and anarthria: Secondary | ICD-10-CM | POA: Insufficient documentation

## 2016-06-08 DIAGNOSIS — R293 Abnormal posture: Secondary | ICD-10-CM | POA: Insufficient documentation

## 2016-06-08 DIAGNOSIS — R2681 Unsteadiness on feet: Secondary | ICD-10-CM | POA: Diagnosis not present

## 2016-06-08 NOTE — Therapy (Signed)
[facility] [address] [place], [place], [address] Phone: [phone]   Fax:  [phone]  Physical Therapy Treatment  Patient Details  Name: Jeremy Walker MRN: [id number] Date of Birth: 1951-02-17 Referring Provider: Alonza Bogus  Encounter Date: 06/08/2016      PT End of Session - 06/08/16 1234    Visit Number 8   Number of Visits 15  Per recert 5/68/12   Date for PT Re-Evaluation 07/31/16   Authorization Type [employer]-30 visit limit for PT   Authorization - Visit Number 8   Authorization - Number of Visits 30   PT Start Time 7517   PT Stop Time 1147   PT Time Calculation (min) 42 min   Equipment Utilized During Treatment Gait belt   Activity Tolerance Patient tolerated treatment well   Behavior During Therapy [facility] for tasks assessed/performed      Past Medical History:  Diagnosis Date  . AICD (automatic cardioverter/defibrillator) present    Abdominal implant was 0064 Guidant lead 6/5 mm  . Anxiety   . Atrial fibrillation ([place])   . CAD (coronary artery disease) 03/2015   a. DESx2 to mid and distal RCA  . Childhood asthma   . Chronic combined systolic and diastolic CHF (congestive heart failure) ([place])    a. ischemic CM - EF 40-45 in 12/16 // b. Echo 12/17: Echo 12/17: EF 50-55, normal wall motion, grade 1 diastolic dysfunction  . Depression   . GERD (gastroesophageal reflux disease)   . History of hiatal hernia   . HLD (hyperlipidemia)   . Hypokalemia    Recurrent associated with cardiac arrest  . Hypothyroidism   . Leg pain    ABIs 12/17: normal   . Sudden cardiac death ([place]) 1994   aborted  . Thyroid disease   . Tobacco abuse    02-16-14 past hx. heavy smoker, quit < 77yr    Past Surgical History:  Procedure Laterality Date  . CARDIAC CATHETERIZATION N/A 04/08/2015   Procedure: Left Heart Cath and Coronary Angiography;  Surgeon: CBurnell Blanks MD;  [facility];  Service:  Cardiovascular;  Laterality: N/A;  . CARDIAC CATHETERIZATION N/A 04/08/2015   Procedure: Coronary Stent Intervention;  Surgeon: CBurnell Blanks MD;  [facility];  Service: Cardiovascular;  Laterality: N/A;  . CARDIAC DEFIBRILLATOR PLACEMENT  1991   guidant  . EUS N/A 02/26/2014   Procedure: ESOPHAGEAL ENDOSCOPIC ULTRASOUND (EUS) RADIAL;  Surgeon: DMilus Banister MD;  [facility];  Service: Endoscopy;  Laterality: N/A;  radial linear  . HAND SURGERY Left 1965   "got it caught in a conveyor belt"    There were no vitals filed for this visit.      Subjective Assessment - 06/08/16 1109    Subjective Had another fall while clearing land nearby.  Tripped over a vine and fell.  Didn't realize that I had wrenched my knee until later yesterday.   Patient Stated Goals Pt's goal for therapy is to get balance back and be able to walk better.    Currently in Pain? Yes   Pain Score 0-No pain                         [facility] Adult PT Treatment/Exercise - 06/08/16 0001      Ambulation/Gait   Ambulation/Gait Yes   Ambulation/Gait Assistance 4: Min guard   Ambulation Distance (Feet) 630 Feet  then 200 ft,  then 100 ft   Assistive device Straight cane   Gait Pattern Step-through pattern;Decreased arm swing - right;Decreased arm swing - left;Decreased stride length;Decreased trunk rotation;Trunk flexed   Ambulation Surface Level;Indoor   Gait Comments Instruction provided to patient for sequencing with gait training using [facility]     Self-Care   Self-Care Other Self-Care Comments   Other Self-Care Comments  Reviewed fall prevention education, including avoid situations that would contribute to falls (wooded, viney area outdoors with inclines clearing land), need to monitor knee and L lateral trunk area, and notify doctor if any changes.  Pt concerned about blood thinners, thinking he may need to stop them-ADVISED patient to not stop medication without  talking with physician and need to improve safety awareness in gait situations to avoid falls as much as possible.      Educated patient in how to obtain single point cane for use for improved safety with gait.   Neuro Re-education:      Balance Exercises - 06/08/16 1126      Balance Exercises: Standing   Stepping Strategy Anterior;Posterior;Lateral;UE support;10 reps   Heel Raises Limitations x 20 with UE support   Toe Raise Limitations x 20 with UE support   Other Standing Exercises Step strategies work:  cues provided for adequate step, foot clearance and weigthshift with UE support at parallel bars    Standing on incline and decline of ramp:  Marching in place x 10, forward step taps x 10, standing with feet apart head turns x 5, head nods x 5, then EC x 10 seconds.  On incline- alternating UE swing for increased balance challenge on incline surface-with supervision/min guard assistance.       PT Education - 06/08/16 1233    Education provided Yes   Education Details Reviewed fall prevention as pt has had 2 falls in 2 weeks; education in cane use/how to obtain cane   Person(s) Educated Patient   Methods Explanation;Demonstration;Handout   Comprehension Verbalized understanding             PT Long Term Goals - 06/01/16 1122      PT LONG TERM GOAL #1   Title Pt will be independent with HEP for improved balance, transfers, and gait.  GOAL ONGOING-TARGET 06/30/16   Time 4   Period Weeks   Status On-going     PT LONG TERM GOAL #2   Title Pt will perform sit<>stand transfers from <18" surfaces, 8 of 10 reps, independently, for improved transfer efficiency and safety.   Baseline 05/29/16: met today   Status Achieved     PT LONG TERM GOAL #3   Title Pt will improve Functional Gait Assessment to at least 20/30 for decreased fall risk.   Baseline 05/29/16: 25/30 scored today   Status Achieved     PT LONG TERM GOAL #4   Title Pt will improve SLS to at least 3 seconds each  leg, for improved stair/obstacle negotiation.   Baseline 05/29/16: met today; SLS <2 seconds each leg 06/01/16   Status Partially Met     PT LONG TERM GOAL #5   Title Pt will verbalize understanding of local Parkinson's disease resources, including exercise options for continued community fitness upon D/C from PT.  GOAL ONGOING, TARGET 06/30/16   Time 4   Period Weeks   Status On-going     Additional Long Term Goals   Additional Long Term Goals Yes     PT LONG TERM GOAL #6   Title  Pt will verbalize understanding of fall prevention in the home environment.     Baseline 05/29/16: information reviewed and issued today.   Time 4   Period Weeks   Status Achieved     PT LONG TERM GOAL #7   Title Pt will improve MiniBESTest score to at least 22/28 for improved overall balance, decreased fall risk.  TARGET 06/30/16   Time 4   Period Weeks   Status New     PT LONG TERM GOAL #8   Title Pt will ambulate at least 1000 ft, indoor and outdoor surfaces, no LOB, independently, for improved outdoor and community gait negotiation.  TARGET 06/30/16   Time 4   Period Weeks   Status New               Plan - 06/08/16 1234    Clinical Impression Statement Pt continues to demonstrate decreased safety awareness when outside of the clinic, especially on uneven terrain and outdoor surfaces.  Discussed multiple safety education/fall prevention techniques, including possibility of uses cane.  Pt amendable to using cane, but continues to report he will mow grass and work on uneven terrain with clearing land near his home.  Pt needs continued cues for proper technique of exercises and gait sequence.   Rehab Potential Good   PT Frequency 2x / week   PT Duration 4 weeks  per recert 6/57/90   PT Treatment/Interventions ADLs/Self Care Home Management;Functional mobility training;Gait training;Therapeutic activities;Therapeutic exercise;Balance training;Neuromuscular re-education;Patient/family education   PT  Next Visit Plan Work on compliant surfaces, including ramp incline/decline; work on step strategies in varied directions.  Review cane use with gait.   Consulted and Agree with Plan of Care Patient      Patient will benefit from skilled therapeutic intervention in order to improve the following deficits and impairments:  Abnormal gait, Decreased balance, Decreased mobility, Decreased strength, Difficulty walking, Postural dysfunction  Visit Diagnosis: Other abnormalities of gait and mobility  Unsteadiness on feet     Problem List Patient Active Problem List   Diagnosis Date Noted  . Coronary artery disease involving native heart without angina pectoris 03/07/2016  . Chronic combined systolic and diastolic CHF (congestive heart failure) ([place]) 03/07/2016  . Chest pain 09/14/2015  . Rib fracture 09/12/2015  . Abrasion of right arm 09/12/2015  . Foot swelling, Left Ankel  12/15/2014  . Discoloration of skin 12/15/2014  . PVD (peripheral vascular disease) with claudication ([place]) 11/20/2014  . Hyperlipidemia 10/13/2014  . Hypothyroidism 10/13/2014  . Leg cramps 10/13/2014  . Physical exam 04/13/2014  . Duodenal diverticulum 02/26/2014  . Bile duct abnormality 02/26/2014  . Special screening for malignant neoplasms, colon 01/30/2014  . Belching 01/30/2014  . Elevated LFTs 01/30/2014  . Flatulence/gas pain/belching 12/04/2013  . Abdominal distension 12/04/2013  . GERD (gastroesophageal reflux disease) 10/08/2013  . Ventricular fibrillation ([place]) 07/26/2010  . Implantable defibrillator-[employer] 07/26/2010  . TOBACCO ABUSE 06/08/2009    MARRIOTT,AMY W. 06/08/2016, 12:41 PM  Frazier Butt., PT  [facility] [address] [place], [place], [address] Phone: [phone]   Fax:  [phone]  Name: Jeremy Walker MRN: [id number] Date of Birth: [birth year]

## 2016-06-08 NOTE — Patient Instructions (Signed)
Medical supply stores: [facility] on [address] [phone]  [facility] on [facility] or in [place] [phone] for both locations  [facility] on [address] [phone]  You want to get a single point cane-make sure it has an offset handle

## 2016-06-08 NOTE — Therapy (Signed)
[facility] [address] [place], [place], [address] Phone: [phone]   Fax:  [phone]  Speech Language Pathology Treatment  Patient Details  Name: Jeremy Walker MRN: [id number] Date of Birth: 03/11/1951 Referring Provider: Alonza Bogus, DO  Encounter Date: 06/08/2016      End of Session - 06/08/16 1059    Visit Number 9   Number of Visits 17   Date for SLP Re-Evaluation 07/03/16   SLP Start Time 1017   SLP Stop Time  1100   SLP Time Calculation (min) 43 min   Activity Tolerance Patient tolerated treatment well      Past Medical History:  Diagnosis Date  . AICD (automatic cardioverter/defibrillator) present    Abdominal implant was 0064 Guidant lead 6/5 mm  . Anxiety   . Atrial fibrillation ([place])   . CAD (coronary artery disease) 03/2015   a. DESx2 to mid and distal RCA  . Childhood asthma   . Chronic combined systolic and diastolic CHF (congestive heart failure) ([place])    a. ischemic CM - EF 40-45 in 12/16 // b. Echo 12/17: Echo 12/17: EF 50-55, normal wall motion, grade 1 diastolic dysfunction  . Depression   . GERD (gastroesophageal reflux disease)   . History of hiatal hernia   . HLD (hyperlipidemia)   . Hypokalemia    Recurrent associated with cardiac arrest  . Hypothyroidism   . Leg pain    ABIs 12/17: normal   . Sudden cardiac death ([place]) 1994   aborted  . Thyroid disease   . Tobacco abuse    02-16-14 past hx. heavy smoker, quit < 70yr.    Past Surgical History:  Procedure Laterality Date  . CARDIAC CATHETERIZATION N/A 04/08/2015   Procedure: Left Heart Cath and Coronary Angiography;  Surgeon: Burnell Blanks, MD;  Location: [facility];  Service: Cardiovascular;  Laterality: N/A;  . CARDIAC CATHETERIZATION N/A 04/08/2015   Procedure: Coronary Stent Intervention;  Surgeon: Burnell Blanks, MD;  Location: [facility];  Service: Cardiovascular;  Laterality: N/A;  .  CARDIAC DEFIBRILLATOR PLACEMENT  1991   guidant  . EUS N/A 02/26/2014   Procedure: ESOPHAGEAL ENDOSCOPIC ULTRASOUND (EUS) RADIAL;  Surgeon: Milus Banister, MD;  [facility];  Service: Endoscopy;  Laterality: N/A;  radial linear  . HAND SURGERY Left 1965   "got it caught in a conveyor belt"    There were no vitals filed for this visit.      Subjective Assessment - 06/08/16 1023    Subjective "I think I have Parkinson's"   Currently in Pain? No/denies               ADULT SLP TREATMENT - 06/08/16 1023      General Information   Behavior/Cognition Alert;Cooperative;Pleasant mood     Treatment Provided   Treatment provided Cognitive-Linquistic     Pain Assessment   Pain Assessment No/denies pain     Cognitive-Linquistic Treatment   Treatment focused on Voice;Dysarthria   Skilled Treatment Loud /a/ average 92dB with min A - pt verbalized and demonstrated adequate posture and breath support for volume. Pt brought in newspater article he practiced breathing and reading loudly - average of 72dB.  Structured speech tasks average 72dB with rare min A. Simple conversation average 72dB with rare min A.     Assessment / Recommendations / Plan   Plan Continue with current plan of care     Progression Toward Goals  Progression toward goals Progressing toward goals            SLP Short Term Goals - 06/08/16 1059      SLP SHORT TERM GOAL #1   Title pt will produce loud /a/ with average >80db over 4 sessions   Status Achieved     SLP SHORT TERM GOAL #2   Title Pt will demonstrate average of >70dB during structured sentence level speech tasks with rare min A over 2 sessions   Status Achieved     SLP SHORT TERM GOAL #3   Title pt will produce speech >70 dB average while using AB for good vocal quality at the sentence level with rare min A over 2 sessions   Baseline 2.12.18, 2.19.18   Status Achieved     SLP SHORT TERM GOAL #4   Title pt will generate 8  minutes simple to mod complex conversation at average >70dB with min nonverbal cues, over two sessions   Baseline 2.22.18   Time 1   Period Weeks   Status On-going     SLP SHORT TERM GOAL #5   Title pt will use AB techniques at multiparagraph level with 85% accuracy and rare min A.   Time 4   Period Weeks   Status New          SLP Long Term Goals - 06/08/16 1059      SLP LONG TERM GOAL #1   Title Pt will average 70dB over 15 minute mildly complex conversation with supervision cues   Time 5   Period Weeks   Status On-going     SLP LONG TERM GOAL #2   Title Pt will converse audibly in noisy environment/outside of therapy room over 15 minutes with supervision cues.   Time 5   Period Weeks   Status On-going          Plan - 06/08/16 1055    Clinical Impression Statement Pt with improvement today using breath support and volume in structured speech tasks and simple conversation with average of 70-72dB - He reports his Aunt is not asking him to repeat himself over the phone. Continue skilled ST to maximize carry over  of volume and intellgibility.    Speech Therapy Frequency 2x / week   Treatment/Interventions Cueing hierarchy;SLP instruction and feedback;Compensatory strategies;Patient/family education;Functional tasks   Potential to Achieve Goals Good   Potential Considerations Medical prognosis   SLP Home Exercise Plan abdominal breathing, paragraph level tasks with AB   Consulted and Agree with Plan of Care Patient      Patient will benefit from skilled therapeutic intervention in order to improve the following deficits and impairments:   Dysarthria and anarthria    Problem List Patient Active Problem List   Diagnosis Date Noted  . Coronary artery disease involving native heart without angina pectoris 03/07/2016  . Chronic combined systolic and diastolic CHF (congestive heart failure) ([place]) 03/07/2016  . Chest pain 09/14/2015  . Rib fracture 09/12/2015  .  Abrasion of right arm 09/12/2015  . Foot swelling, Left Ankel  12/15/2014  . Discoloration of skin 12/15/2014  . PVD (peripheral vascular disease) with claudication ([place]) 11/20/2014  . Hyperlipidemia 10/13/2014  . Hypothyroidism 10/13/2014  . Leg cramps 10/13/2014  . Physical exam 04/13/2014  . Duodenal diverticulum 02/26/2014  . Bile duct abnormality 02/26/2014  . Special screening for malignant neoplasms, colon 01/30/2014  . Belching 01/30/2014  . Elevated LFTs 01/30/2014  . Flatulence/gas pain/belching 12/04/2013  . Abdominal distension  12/04/2013  . GERD (gastroesophageal reflux disease) 10/08/2013  . Ventricular fibrillation ([place]) 07/26/2010  . Implantable defibrillator-[employer] 07/26/2010  . TOBACCO ABUSE 06/08/2009    Lovvorn, Annye Rusk MS, CCC-SLP 06/08/2016, 11:00 AM  [facility] [address] [place], [place], [address] Phone: [phone]   Fax:  [phone]   Name: Rajahn Savidge MRN: [id number] Date of Birth: [birth year]

## 2016-06-12 ENCOUNTER — Ambulatory Visit: Payer: Medicare HMO | Admitting: Speech Pathology

## 2016-06-12 ENCOUNTER — Ambulatory Visit: Payer: Medicare HMO | Admitting: Physical Therapy

## 2016-06-12 DIAGNOSIS — R2689 Other abnormalities of gait and mobility: Secondary | ICD-10-CM

## 2016-06-12 DIAGNOSIS — R293 Abnormal posture: Secondary | ICD-10-CM | POA: Diagnosis not present

## 2016-06-12 DIAGNOSIS — R2681 Unsteadiness on feet: Secondary | ICD-10-CM | POA: Diagnosis not present

## 2016-06-12 DIAGNOSIS — R471 Dysarthria and anarthria: Secondary | ICD-10-CM | POA: Diagnosis not present

## 2016-06-12 NOTE — Therapy (Signed)
[facility] [address] [place], [place], [address] Phone: [phone]   Fax:  [phone]  Physical Therapy Treatment  Patient Details  Name: Jeremy Walker MRN: [id number] Date of Birth: 1951-02-08 Referring Provider: Alonza Bogus  Encounter Date: 06/12/2016      PT End of Session - 06/12/16 1059    Visit Number 9   Number of Visits 15  Per recert 5/87/27   Date for PT Re-Evaluation 07/31/16   Authorization Type [employer]-30 visit limit for PT   Authorization - Visit Number 8   Authorization - Number of Visits 30   PT Start Time 1017   PT Stop Time 1057   PT Time Calculation (min) 40 min   Equipment Utilized During Treatment Gait belt   Activity Tolerance Patient tolerated treatment well   Behavior During Therapy [facility] for tasks assessed/performed      Past Medical History:  Diagnosis Date  . AICD (automatic cardioverter/defibrillator) present    Abdominal implant was 0064 Guidant lead 6/5 mm  . Anxiety   . Atrial fibrillation ([place])   . CAD (coronary artery disease) 03/2015   a. DESx2 to mid and distal RCA  . Childhood asthma   . Chronic combined systolic and diastolic CHF (congestive heart failure) ([place])    a. ischemic CM - EF 40-45 in 12/16 // b. Echo 12/17: Echo 12/17: EF 50-55, normal wall motion, grade 1 diastolic dysfunction  . Depression   . GERD (gastroesophageal reflux disease)   . History of hiatal hernia   . HLD (hyperlipidemia)   . Hypokalemia    Recurrent associated with cardiac arrest  . Hypothyroidism   . Leg pain    ABIs 12/17: normal   . Sudden cardiac death ([place]) 1994   aborted  . Thyroid disease   . Tobacco abuse    02-16-14 past hx. heavy smoker, quit < 62yr    Past Surgical History:  Procedure Laterality Date  . CARDIAC CATHETERIZATION N/A 04/08/2015   Procedure: Left Heart Cath and Coronary Angiography;  Surgeon: CBurnell Blanks MD;  [facility];  Service:  Cardiovascular;  Laterality: N/A;  . CARDIAC CATHETERIZATION N/A 04/08/2015   Procedure: Coronary Stent Intervention;  Surgeon: CBurnell Blanks MD;  [facility];  Service: Cardiovascular;  Laterality: N/A;  . CARDIAC DEFIBRILLATOR PLACEMENT  1991   guidant  . EUS N/A 02/26/2014   Procedure: ESOPHAGEAL ENDOSCOPIC ULTRASOUND (EUS) RADIAL;  Surgeon: DMilus Banister MD;  [facility];  Service: Endoscopy;  Laterality: N/A;  radial linear  . HAND SURGERY Left 1965   "got it caught in a conveyor belt"    There were no vitals filed for this visit.      Subjective Assessment - 06/12/16 1018    Subjective Pt slipped in wet grass when mowing lawn Saturday. No injury.   Patient Stated Goals Pt's goal for therapy is to get balance back and be able to walk better.    Currently in Pain? Yes   Pain Score 2    Pain Location Knee   Pain Orientation Left   Pain Descriptors / Indicators Aching   Pain Type Acute pain  possibly from fall last week.   Pain Onset In the past 7 days   Pain Frequency Occasional   Aggravating Factors  When walking, "feels like bone on  bone."  [place] Adult PT Treatment/Exercise - 06/12/16 0001      Ambulation/Gait   Ambulation/Gait Yes   Ambulation/Gait Assistance 5: Supervision   Ambulation/Gait Assistance Details working on gait technique with SPC with community type barriers- stepping over and onto barriers with cane "leading".            Ambulation Distance (Feet) 400 Feet   Assistive device Straight cane   Gait Pattern Step-through pattern;Decreased arm swing - right;Decreased arm swing - left;Decreased stride length;Decreased trunk rotation;Trunk flexed   Ambulation Surface Level;Indoor   Stairs Yes   Stairs Assistance 5: Supervision   Stairs Assistance Details (indicate cue type and reason) working [facility] sequence and cues for arm swing   Stair Management Technique One rail  Right;Alternating pattern;With cane   Number of Stairs 4  x2   Ramp 5: Supervision   Ramp Details (indicate cue type and reason) cues for sequence with SPC   Curb 5: Supervision   Curb Details (indicate cue type and reason) cues for sequence with cane     Knee/Hip Exercises: Aerobic   Elliptical 9mn level 3, cues to slow down and pace himself, requested to stop at 3 min due to fatigue.             Balance Exercises - 06/12/16 1046      Balance Exercises: Standing   Stepping Strategy Anterior;Foam/compliant surface;UE support  with cane in right hand, cues for weight shifting, cane plac   Sit to Stand Time 10x2 without UE support, cues for body mechanics and posture.           PT Education - 06/12/16 1057    Education provided Yes   Education Details Instructed to practise walking [facility] worked on [facility].   Person(s) Educated Patient   Methods Explanation;Demonstration;Verbal cues   Comprehension Verbalized understanding;Returned demonstration;Verbal cues required;Need further instruction             PT Long Term Goals - 06/01/16 1122      PT LONG TERM GOAL #1   Title Pt will be independent with HEP for improved balance, transfers, and gait.  GOAL ONGOING-TARGET 06/30/16   Time 4   Period Weeks   Status On-going     PT LONG TERM GOAL #2   Title Pt will perform sit<>stand transfers from <18" surfaces, 8 of 10 reps, independently, for improved transfer efficiency and safety.   Baseline 05/29/16: met today   Status Achieved     PT LONG TERM GOAL #3   Title Pt will improve Functional Gait Assessment to at least 20/30 for decreased fall risk.   Baseline 05/29/16: 25/30 scored today   Status Achieved     PT LONG TERM GOAL #4   Title Pt will improve SLS to at least 3 seconds each leg, for improved stair/obstacle negotiation.   Baseline 05/29/16: met today; SLS <2 seconds each leg 06/01/16   Status Partially Met     PT  LONG TERM GOAL #5   Title Pt will verbalize understanding of local Parkinson's disease resources, including exercise options for continued community fitness upon D/C from PT.  GOAL ONGOING, TARGET 06/30/16   Time 4   Period Weeks   Status On-going     Additional Long Term Goals   Additional Long Term Goals Yes     PT LONG TERM GOAL #6   Title Pt will verbalize understanding of fall prevention in the home environment.  Baseline 05/29/16: information reviewed and issued today.   Time 4   Period Weeks   Status Achieved     PT LONG TERM GOAL #7   Title Pt will improve MiniBESTest score to at least 22/28 for improved overall balance, decreased fall risk.  TARGET 06/30/16   Time 4   Period Weeks   Status New     PT LONG TERM GOAL #8   Title Pt will ambulate at least 1000 ft, indoor and outdoor surfaces, no LOB, independently, for improved outdoor and community gait negotiation.  TARGET 06/30/16   Time 4   Period Weeks   Status New               Plan - 06/12/16 1100    Clinical Impression Statement Pt was able to buy [facility] since last visit. Pt continues to demonstrate poor safety awareness with outside mobility having a fall since last visit.  Gait trained working on community obstacles using [place]; pt requiring min cues for technique but had no LOB. Progress balance training on compliant surface with cane working on stepping strategies; pt requring supervision and cues for body mechanics with  weight shifts.  Encourage pt to practise gait with cane outdoors on paved surfaces.                                                                                                       Rehab Potential Good   PT Frequency 2x / week   PT Duration 4 weeks  per recert 04/26/55   PT Treatment/Interventions ADLs/Self Care Home Management;Functional mobility training;Gait training;Therapeutic activities;Therapeutic exercise;Balance training;Neuromuscular re-education;Patient/family education   PT Next  Visit Plan Work on compliant surfaces, including ramp incline/decline; work on step strategies in varied directions.  Review cane use with gait. Review floor transfers.   Consulted and Agree with Plan of Care Patient      Patient will benefit from skilled therapeutic intervention in order to improve the following deficits and impairments:  Abnormal gait, Decreased balance, Decreased mobility, Decreased strength, Difficulty walking, Postural dysfunction  Visit Diagnosis: Other abnormalities of gait and mobility  Unsteadiness on feet  Abnormal posture     Problem List Patient Active Problem List   Diagnosis Date Noted  . Coronary artery disease involving native heart without angina pectoris 03/07/2016  . Chronic combined systolic and diastolic CHF (congestive heart failure) ([place]) 03/07/2016  . Chest pain 09/14/2015  . Rib fracture 09/12/2015  . Abrasion of right arm 09/12/2015  . Foot swelling, Left Ankel  12/15/2014  . Discoloration of skin 12/15/2014  . PVD (peripheral vascular disease) with claudication ([place]) 11/20/2014  . Hyperlipidemia 10/13/2014  . Hypothyroidism 10/13/2014  . Leg cramps 10/13/2014  . Physical exam 04/13/2014  . Duodenal diverticulum 02/26/2014  . Bile duct abnormality 02/26/2014  . Special screening for malignant neoplasms, colon 01/30/2014  . Belching 01/30/2014  . Elevated LFTs 01/30/2014  . Flatulence/gas pain/belching 12/04/2013  . Abdominal distension 12/04/2013  . GERD (gastroesophageal reflux disease) 10/08/2013  . Ventricular fibrillation ([place]) 07/26/2010  . Implantable defibrillator-[employer] 07/26/2010  .  TOBACCO ABUSE 06/08/2009   Bjorn Loser, PTA  06/12/16, 12:28 PM [facility] [address] [place], [place], [address] Phone: [phone]   Fax:  [phone]  Name: Jeremy Walker MRN: [id number] Date of Birth: 1950-08-29

## 2016-06-12 NOTE — Therapy (Signed)
[facility] [address] [place], [place], [address] Phone: [phone]   Fax:  [phone]  Speech Language Pathology Treatment  Patient Details  Name: Jeremy Walker MRN: [id number] Date of Birth: [birth year] Referring Provider: Alonza Bogus, DO  Encounter Date: 06/12/2016      End of Session - 06/12/16 1740    Visit Number 10   Number of Visits 17   Date for SLP Re-Evaluation 07/03/16   SLP Start Time 1103   SLP Stop Time  1144   SLP Time Calculation (min) 41 min   Activity Tolerance Patient tolerated treatment well      Past Medical History:  Diagnosis Date  . AICD (automatic cardioverter/defibrillator) present    Abdominal implant was 0064 Guidant lead 6/5 mm  . Anxiety   . Atrial fibrillation ([place])   . CAD (coronary artery disease) 03/2015   a. DESx2 to mid and distal RCA  . Childhood asthma   . Chronic combined systolic and diastolic CHF (congestive heart failure) ([place])    a. ischemic CM - EF 40-45 in 12/16 // b. Echo 12/17: Echo 12/17: EF 50-55, normal wall motion, grade 1 diastolic dysfunction  . Depression   . GERD (gastroesophageal reflux disease)   . History of hiatal hernia   . HLD (hyperlipidemia)   . Hypokalemia    Recurrent associated with cardiac arrest  . Hypothyroidism   . Leg pain    ABIs 12/17: normal   . Sudden cardiac death ([place]) 1994   aborted  . Thyroid disease   . Tobacco abuse    02-16-14 past hx. heavy smoker, quit < 4yr    Past Surgical History:  Procedure Laterality Date  . CARDIAC CATHETERIZATION N/A 04/08/2015   Procedure: Left Heart Cath and Coronary Angiography;  Surgeon: CBurnell Blanks MD;  [facility];  Service: Cardiovascular;  Laterality: N/A;  . CARDIAC CATHETERIZATION N/A 04/08/2015   Procedure: Coronary Stent Intervention;  Surgeon: CBurnell Blanks MD;  [facility];  Service: Cardiovascular;  Laterality: N/A;  .  CARDIAC DEFIBRILLATOR PLACEMENT  1991   guidant  . EUS N/A 02/26/2014   Procedure: ESOPHAGEAL ENDOSCOPIC ULTRASOUND (EUS) RADIAL;  Surgeon: DMilus Banister MD;  [facility];  Service: Endoscopy;  Laterality: N/A;  radial linear  . HAND SURGERY Left 1965   "got it caught in a conveyor belt"    There were no vitals filed for this visit.      Subjective Assessment - 06/12/16 1112    Subjective "I have a hard time taking a deep breath because I was a heavy smoker."   Currently in Pain? No/denies               ADULT SLP TREATMENT - 06/12/16 1113      General Information   Behavior/Cognition Alert;Cooperative;Pleasant mood   Oral care provided N/A     Treatment Provided   Treatment provided Cognitive-Linquistic     Pain Assessment   Pain Assessment No/denies pain     Cognitive-Linquistic Treatment   Treatment focused on Voice;Dysarthria   Skilled Treatment Loud /a/ average 88.2 dB at 50cm with min A - pt verbalized and demonstrated adequate posture and breath support for volume. Multiparagraph level reading task avg 73.8.  Structured conversation tasks average 75.6 dB with rare min A. Spontaneous speech avg 62.6 dB, improved to 74.8 with verbal cues.      Assessment / Recommendations / Plan   Plan  Continue with current plan of care     Progression Toward Goals   Progression toward goals Progressing toward goals          SLP Education - Jul 10, 2016 1740    Education provided Yes   Education Details HEP   Person(s) Educated Patient   Methods Explanation;Demonstration   Comprehension Verbalized understanding;Returned demonstration          SLP Short Term Goals - 10-Jul-2016 1742      SLP SHORT TERM GOAL #1   Title pt will produce loud /a/ with average >80db over 4 sessions   Status Achieved     SLP SHORT TERM GOAL #2   Title Pt will demonstrate average of >70dB during structured sentence level speech tasks with rare min A over 2 sessions   Period  Weeks   Status Achieved     SLP SHORT TERM GOAL #3   Title pt will produce speech >70 dB average while using AB for good vocal quality at the sentence level with rare min A over 2 sessions   Period Weeks   Status Achieved     SLP SHORT TERM GOAL #4   Title pt will generate 8 minutes simple to mod complex conversation at average >70dB with min nonverbal cues, over two sessions   Baseline 2.22.18 July 10, 2016   Time 0   Period Weeks   Status Achieved     SLP SHORT TERM GOAL #5   Title pt will use AB techniques at multiparagraph level with 85% accuracy and rare min A.   Time 3   Period Weeks   Status On-going     Additional Short Term Goals   Additional Short Term Goals Yes          SLP Long Term Goals - 07/10/2016 1745      SLP LONG TERM GOAL #1   Title Pt will average 70dB over 15 minute mildly complex conversation with supervision cues   Time 4   Period Weeks   Status On-going     SLP LONG TERM GOAL #2   Title Pt will converse audibly in noisy environment/outside of therapy room over 15 minutes with supervision cues.   Time 4   Period Weeks   Status On-going          Plan - 07/10/2016 1742    Speech Therapy Frequency 2x / week   Duration Other (comment)   Treatment/Interventions Cueing hierarchy;SLP instruction and feedback;Compensatory strategies;Patient/family education;Functional tasks   Potential to Achieve Goals Good   Potential Considerations Medical prognosis   SLP Home Exercise Plan abdominal breathing, paragraph level tasks with AB   Consulted and Agree with Plan of Care Patient      Patient will benefit from skilled therapeutic intervention in order to improve the following deficits and impairments:   Dysarthria and anarthria      G-Codes - 10-Jul-2016 1747    Functional Assessment Tool Used NOMS 5   Functional Limitations Motor speech   Motor Speech Current Status [phone]) At least 20 percent but less than 40 percent impaired, limited or restricted    Motor Speech Goal Status ([id number]) At least 1 percent but less than 20 percent impaired, limited or restricted      Problem List Patient Active Problem List   Diagnosis Date Noted  . Coronary artery disease involving native heart without angina pectoris 03/07/2016  . Chronic combined systolic and diastolic CHF (congestive heart failure) ([place]) 03/07/2016  . Chest pain 09/14/2015  .  Rib fracture 09/12/2015  . Abrasion of right arm 09/12/2015  . Foot swelling, Left Ankel  12/15/2014  . Discoloration of skin 12/15/2014  . PVD (peripheral vascular disease) with claudication ([place]) 11/20/2014  . Hyperlipidemia 10/13/2014  . Hypothyroidism 10/13/2014  . Leg cramps 10/13/2014  . Physical exam 04/13/2014  . Duodenal diverticulum 02/26/2014  . Bile duct abnormality 02/26/2014  . Special screening for malignant neoplasms, colon 01/30/2014  . Belching 01/30/2014  . Elevated LFTs 01/30/2014  . Flatulence/gas pain/belching 12/04/2013  . Abdominal distension 12/04/2013  . GERD (gastroesophageal reflux disease) 10/08/2013  . Ventricular fibrillation ([place]) 07/26/2010  . Implantable defibrillator-[employer] 07/26/2010  . TOBACCO ABUSE 06/08/2009   Speech Therapy Progress Note  Dates of Reporting Period: 05/01/16 to 06/12/16  Objective Reports of Subjective Statement: Patient improving awareness and self-monitoring of vocal amplitude, continues to require min A for extending loudness and abdominal breathing to conversation level tasks.  Objective Measurements: Sustained /a/ improved from 63dB avg at initial eval to 88.2dB avg today with rare min A. Conversational speech improved from 59.1 at initial eval to 74.8 avg today with min A.  Goal Update: Patient has met 4 short term goals and is on track to achieve long term goals within the next 4 weeks.   Plan: Will target conversation-level and carryover tasks during the next 4 weeks.  Reason Skilled Services are Required: Patient requires  continued skilled ST services to improve use of vocal techniques to conversational tasks and for generalization in order to improve communication and speech intelligibility outside of therapy.  Deneise Lever, [place] CF-SLP Speech-Language Pathologist  Aliene Altes 06/12/2016, 5:48 PM  [facility] [address] [place], [place], [address] Phone: [phone]   Fax:  [phone]  Name: Jeremy Walker MRN: [id number] Date of Birth: 19-Dec-1950

## 2016-06-15 ENCOUNTER — Encounter: Payer: Self-pay | Admitting: Rehabilitation

## 2016-06-15 ENCOUNTER — Ambulatory Visit: Payer: Medicare HMO | Admitting: Speech Pathology

## 2016-06-15 ENCOUNTER — Ambulatory Visit: Payer: Medicare HMO | Admitting: Rehabilitation

## 2016-06-15 DIAGNOSIS — R2689 Other abnormalities of gait and mobility: Secondary | ICD-10-CM

## 2016-06-15 DIAGNOSIS — R293 Abnormal posture: Secondary | ICD-10-CM | POA: Diagnosis not present

## 2016-06-15 DIAGNOSIS — R471 Dysarthria and anarthria: Secondary | ICD-10-CM | POA: Diagnosis not present

## 2016-06-15 DIAGNOSIS — R2681 Unsteadiness on feet: Secondary | ICD-10-CM

## 2016-06-15 NOTE — Therapy (Signed)
[facility] [address] [place], [place], [address] Phone: [phone]   Fax:  [phone]  Speech Language Pathology Treatment  Patient Details  Name: Jeremy Walker MRN: [id number] Date of Birth: Aug 18, 1950 Referring Provider: Alonza Bogus, DO  Encounter Date: 06/15/2016      End of Session - 06/15/16 1055    Visit Number 11   Number of Visits 17   Date for SLP Re-Evaluation 07/03/16   SLP Start Time 1017   SLP Stop Time  1100   SLP Time Calculation (min) 43 min   Activity Tolerance Patient tolerated treatment well      Past Medical History:  Diagnosis Date  . AICD (automatic cardioverter/defibrillator) present    Abdominal implant was 0064 Guidant lead 6/5 mm  . Anxiety   . Atrial fibrillation ([place])   . CAD (coronary artery disease) 03/2015   a. DESx2 to mid and distal RCA  . Childhood asthma   . Chronic combined systolic and diastolic CHF (congestive heart failure) ([place])    a. ischemic CM - EF 40-45 in 12/16 // b. Echo 12/17: Echo 12/17: EF 50-55, normal wall motion, grade 1 diastolic dysfunction  . Depression   . GERD (gastroesophageal reflux disease)   . History of hiatal hernia   . HLD (hyperlipidemia)   . Hypokalemia    Recurrent associated with cardiac arrest  . Hypothyroidism   . Leg pain    ABIs 12/17: normal   . Sudden cardiac death ([place]) 1994   aborted  . Thyroid disease   . Tobacco abuse    02-16-14 past hx. heavy smoker, quit < 22yr.    Past Surgical History:  Procedure Laterality Date  . CARDIAC CATHETERIZATION N/A 04/08/2015   Procedure: Left Heart Cath and Coronary Angiography;  Surgeon: Burnell Blanks, MD;  Location: [facility];  Service: Cardiovascular;  Laterality: N/A;  . CARDIAC CATHETERIZATION N/A 04/08/2015   Procedure: Coronary Stent Intervention;  Surgeon: Burnell Blanks, MD;  Location: [facility];  Service: Cardiovascular;  Laterality: N/A;  .  CARDIAC DEFIBRILLATOR PLACEMENT  1991   guidant  . EUS N/A 02/26/2014   Procedure: ESOPHAGEAL ENDOSCOPIC ULTRASOUND (EUS) RADIAL;  Surgeon: Milus Banister, MD;  Location: [facility];  Service: Endoscopy;  Laterality: N/A;  radial linear  . HAND SURGERY Left 1965   "got it caught in a conveyor belt"    There were no vitals filed for this visit.      Subjective Assessment - 06/15/16 1020    Subjective "I'm gonna be in big trouble because I haven't done any of my homework."   Currently in Pain? No/denies               ADULT SLP TREATMENT - 06/15/16 1021      General Information   Behavior/Cognition Alert;Cooperative;Pleasant mood   Patient Positioning Upright in chair     Treatment Provided   Treatment provided Cognitive-Linquistic     Pain Assessment   Pain Assessment No/denies pain     Cognitive-Linquistic Treatment   Treatment focused on Voice;Dysarthria   Skilled Treatment Loud /a/ average 87 dB at 50cm with min A - pt verbalized and demonstrated adequate posture and breath support for volume. Multiparagraph level reading task avg 72.9.  Structured conversation tasks average 75.6 dB with rare min A. Spontaneous speech avg before session 61.6 dB, improved to 72 following hierarchy tasks .  SLP Short Term Goals - 06/15/16 1056      SLP SHORT TERM GOAL #1   Title pt will produce loud /a/ with average >80db over 4 sessions   Baseline 1.29.18, 2.12.18, 2.19.18, 2.22.18   Status Achieved     SLP SHORT TERM GOAL #2   Title Pt will demonstrate average of >70dB during structured sentence level speech tasks with rare min A over 2 sessions   Baseline 1.29.18, 2.1.18   Status New     SLP SHORT TERM GOAL #3   Title pt will produce speech >70 dB average while using AB for good vocal quality at the sentence level with rare min A over 2 sessions   Status Achieved     SLP SHORT TERM GOAL #4   Title pt will generate 8 minutes simple to mod complex  conversation at average >70dB with min nonverbal cues, over two sessions   Status Achieved     SLP SHORT TERM GOAL #5   Title pt will use AB techniques at multiparagraph level with 85% accuracy and rare min A.   Time 2   Period Weeks   Status Achieved          SLP Long Term Goals - 06/15/16 1058      SLP LONG TERM GOAL #1   Title Pt will average 70dB over 15 minute mildly complex conversation with supervision cues   Time 3   Period Weeks   Status On-going     SLP LONG TERM GOAL #2   Title Pt will converse audibly in noisy environment/outside of therapy room over 15 minutes with supervision cues.   Time 3   Period Weeks   Status On-going          Plan - 06/15/16 1059    Clinical Impression Statement Pt with improvement today using breath support and volume in structured speech tasks 10 minutes simple conversation with average of 72dB. During multiparagraph reading, patient is showing improvement in self-monitoring, stating "I need to do that again," and "that wasn't loud enough, was it?" Anticipate pt will meet conversation and carryover goals in next 2 session. Continue skilled ST to maximize carry over  of volume and intellgibility.    Speech Therapy Frequency 2x / week   Duration Other (comment)   Treatment/Interventions Cueing hierarchy;SLP instruction and feedback;Compensatory strategies;Patient/family education;Functional tasks   Potential to Achieve Goals Good   Potential Considerations Medical prognosis   SLP Home Exercise Plan breath pacing   Consulted and Agree with Plan of Care Patient      Patient will benefit from skilled therapeutic intervention in order to improve the following deficits and impairments:   Dysarthria and anarthria    Problem List Patient Active Problem List   Diagnosis Date Noted  . Coronary artery disease involving native heart without angina pectoris 03/07/2016  . Chronic combined systolic and diastolic CHF (congestive heart failure)  ([place]) 03/07/2016  . Chest pain 09/14/2015  . Rib fracture 09/12/2015  . Abrasion of right arm 09/12/2015  . Foot swelling, Left Ankel  12/15/2014  . Discoloration of skin 12/15/2014  . PVD (peripheral vascular disease) with claudication ([place]) 11/20/2014  . Hyperlipidemia 10/13/2014  . Hypothyroidism 10/13/2014  . Leg cramps 10/13/2014  . Physical exam 04/13/2014  . Duodenal diverticulum 02/26/2014  . Bile duct abnormality 02/26/2014  . Special screening for malignant neoplasms, colon 01/30/2014  . Belching 01/30/2014  . Elevated LFTs 01/30/2014  . Flatulence/gas pain/belching 12/04/2013  . Abdominal distension 12/04/2013  .  GERD (gastroesophageal reflux disease) 10/08/2013  . Ventricular fibrillation ([place]) 07/26/2010  . Implantable defibrillator-[employer] 07/26/2010  . TOBACCO ABUSE 06/08/2009   Deneise Lever, MS CF-SLP Speech-Language Pathologist  Aliene Altes 06/15/2016, 11:07 AM  [facility] [address] [place], [place], [address] Phone: [phone]   Fax:  [phone]   Name: Jeremy Walker MRN: [id number] Date of Birth: Jul 31, 1950

## 2016-06-15 NOTE — Therapy (Signed)
[facility] [address] [place], [place], [address] Phone: [phone]   Fax:  [phone]  Physical Therapy Treatment  Patient Details  Name: Jeremy Walker MRN: [id number] Date of Birth: February 02, 1951 Referring Provider: Alonza Bogus  Encounter Date: 06/15/2016      PT End of Session - 06/15/16 1212    Visit Number 10   Number of Visits 15  Per recert 0/51/10   Date for PT Re-Evaluation 07/31/16   Authorization Type [employer]-30 visit limit for PT   Authorization - Visit Number 9   Authorization - Number of Visits 30   PT Start Time 1102   PT Stop Time 1146   PT Time Calculation (min) 44 min   Equipment Utilized During Treatment Gait belt   Activity Tolerance Patient tolerated treatment well   Behavior During Therapy [facility] for tasks assessed/performed      Past Medical History:  Diagnosis Date  . AICD (automatic cardioverter/defibrillator) present    Abdominal implant was 0064 Guidant lead 6/5 mm  . Anxiety   . Atrial fibrillation ([place])   . CAD (coronary artery disease) 03/2015   a. DESx2 to mid and distal RCA  . Childhood asthma   . Chronic combined systolic and diastolic CHF (congestive heart failure) ([place])    a. ischemic CM - EF 40-45 in 12/16 // b. Echo 12/17: Echo 12/17: EF 50-55, normal wall motion, grade 1 diastolic dysfunction  . Depression   . GERD (gastroesophageal reflux disease)   . History of hiatal hernia   . HLD (hyperlipidemia)   . Hypokalemia    Recurrent associated with cardiac arrest  . Hypothyroidism   . Leg pain    ABIs 12/17: normal   . Sudden cardiac death ([place]) 1994   aborted  . Thyroid disease   . Tobacco abuse    02-16-14 past hx. heavy smoker, quit < 17yr    Past Surgical History:  Procedure Laterality Date  . CARDIAC CATHETERIZATION N/A 04/08/2015   Procedure: Left Heart Cath and Coronary Angiography;  Surgeon: CBurnell Blanks MD;  [facility];  Service:  Cardiovascular;  Laterality: N/A;  . CARDIAC CATHETERIZATION N/A 04/08/2015   Procedure: Coronary Stent Intervention;  Surgeon: CBurnell Blanks MD;  [facility];  Service: Cardiovascular;  Laterality: N/A;  . CARDIAC DEFIBRILLATOR PLACEMENT  1991   guidant  . EUS N/A 02/26/2014   Procedure: ESOPHAGEAL ENDOSCOPIC ULTRASOUND (EUS) RADIAL;  Surgeon: DMilus Banister MD;  [facility];  Service: Endoscopy;  Laterality: N/A;  radial linear  . HAND SURGERY Left 1965   "got it caught in a conveyor belt"    There were no vitals filed for this visit.      Subjective Assessment - 06/15/16 1127    Subjective Pt reports some L knee pain, he thinks from his fall.    Limitations House hold activities;Walking   Patient Stated Goals Pt's goal for therapy is to get balance back and be able to walk better.    Currently in Pain? Yes   Pain Score 1    Pain Location Knee   Pain Orientation Left   Pain Descriptors / Indicators Aching   Pain Type Acute pain   Pain Onset In the past 7 days   Pain Frequency Intermittent   Aggravating Factors  feels like i sprained it   Pain Relieving Factors ice and Tylenol  [facility] Adult PT Treatment/Exercise - 06/15/16 0001      Ambulation/Gait   Ambulation/Gait Yes   Ambulation/Gait Assistance 5: Supervision   Ambulation/Gait Assistance Details Gait during session to get to/from balance activities with SPC at S level with min cues for sequencing and upright posture.     Ambulation Distance (Feet) 150 Feet   Assistive device Straight cane   Gait Pattern Step-through pattern;Decreased arm swing - right;Decreased arm swing - left;Decreased stride length;Decreased trunk rotation;Trunk flexed   Ambulation Surface Level;Indoor   Stairs Yes   Stairs Assistance 5: Supervision   Stairs Assistance Details (indicate cue type and reason) Min cues for sequencing with cane   Stair Management Technique  One rail Right;Alternating pattern;With cane   Number of Stairs 4   Height of Stairs 6   Ramp 5: Supervision   Curb 5: Supervision   Curb Details (indicate cue type and reason) Min cues for sequencing     Therapeutic Activites    Therapeutic Activities Other Therapeutic Activities   Other Therapeutic Activities Went over floor recovery transfer as pt states 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  Pt demonstrated to PT how he normally does transfer and note that he transitions from squat position to standing with feet somewhat side by side.  Recommend he try tall kneeling>half kneeling> keeping feet staggered esp if on hill to maintain balance.  Pt returned demonstration.      Neuro Re-ed    Neuro Re-ed Details  High level balance to focus on modified SLS for improved R lateral weight shift.  Cone taps alternating LEs x 10 reps on red therapy mat progressing to tipping cone over and back upright x 10 reps to increase amount of time in SLS.  Cues for increased R lateral weight shift in order to improve balance as when he does lose balance its to the L.  Performed forwards/retro marching x 20' x 1 rep with min A and cues for slow pace to increase time in SLS and technique esp when retro stepping.  Feet staggered on blue mat while performing head turns side to side x 10 reps, up/down x 10 reps and EC x 1 rep of 30 secs then switched LE.  Tolerated well.                  PT Education - 06/15/16 1154    Education provided Yes   Education Details floor recovery    Person(s) Educated Patient   Methods Explanation   Comprehension Verbalized understanding             PT Long Term Goals - 06/01/16 1122      PT LONG TERM GOAL #1   Title Pt will be independent with HEP for improved balance, transfers, and gait.  GOAL ONGOING-TARGET 06/30/16   Time 4   Period Weeks   Status On-going     PT LONG TERM GOAL #2   Title Pt will perform sit<>stand  transfers from <18" surfaces, 8 of 10 reps, independently, for improved transfer efficiency and safety.   Baseline 05/29/16: met today   Status Achieved     PT LONG TERM GOAL #3   Title Pt will improve Functional Gait Assessment to at least 20/30 for decreased fall risk.   Baseline 05/29/16: 25/30 scored today   Status Achieved     PT LONG TERM GOAL #4   Title Pt will improve SLS to at least 3 seconds each  leg, for improved stair/obstacle negotiation.   Baseline 05/29/16: met today; SLS <2 seconds each leg 06/01/16   Status Partially Met     PT LONG TERM GOAL #5   Title Pt will verbalize understanding of local Parkinson's disease resources, including exercise options for continued community fitness upon D/C from PT.  GOAL ONGOING, TARGET 06/30/16   Time 4   Period Weeks   Status On-going     Additional Long Term Goals   Additional Long Term Goals Yes     PT LONG TERM GOAL #6   Title Pt will verbalize understanding of fall prevention in the home environment.     Baseline 05/29/16: information reviewed and issued today.   Time 4   Period Weeks   Status Achieved     PT LONG TERM GOAL #7   Title Pt will improve MiniBESTest score to at least 22/28 for improved overall balance, decreased fall risk.  TARGET 06/30/16   Time 4   Period Weeks   Status New     PT LONG TERM GOAL #8   Title Pt will ambulate at least 1000 ft, indoor and outdoor surfaces, no LOB, independently, for improved outdoor and community gait negotiation.  TARGET 06/30/16   Time 4   Period Weeks   Status New               Plan - 06/15/16 1212    Clinical Impression Statement Skilled session continues to focus on safety with SPC with gait, stairs and curb/ramp negotiation, high level balance with emphasis on SLS and floor recovery.  Tolerated all well and progressing towards LTGs.    Rehab Potential Good   PT Frequency 2x / week   PT Duration 4 weeks  per recert 2/42/68   PT Treatment/Interventions ADLs/Self  Care Home Management;Functional mobility training;Gait training;Therapeutic activities;Therapeutic exercise;Balance training;Neuromuscular re-education;Patient/family education   PT Next Visit Plan Work on compliant surfaces, including ramp incline/decline; work on step strategies in varied directions.  Review cane use with gait. Review floor transfers.   Consulted and Agree with Plan of Care Patient      Patient will benefit from skilled therapeutic intervention in order to improve the following deficits and impairments:  Abnormal gait, Decreased balance, Decreased mobility, Decreased strength, Difficulty walking, Postural dysfunction  Visit Diagnosis: Other abnormalities of gait and mobility  Unsteadiness on feet  Abnormal posture     Problem List Patient Active Problem List   Diagnosis Date Noted  . Coronary artery disease involving native heart without angina pectoris 03/07/2016  . Chronic combined systolic and diastolic CHF (congestive heart failure) ([place]) 03/07/2016  . Chest pain 09/14/2015  . Rib fracture 09/12/2015  . Abrasion of right arm 09/12/2015  . Foot swelling, Left Ankel  12/15/2014  . Discoloration of skin 12/15/2014  . PVD (peripheral vascular disease) with claudication ([place]) 11/20/2014  . Hyperlipidemia 10/13/2014  . Hypothyroidism 10/13/2014  . Leg cramps 10/13/2014  . Physical exam 04/13/2014  . Duodenal diverticulum 02/26/2014  . Bile duct abnormality 02/26/2014  . Special screening for malignant neoplasms, colon 01/30/2014  . Belching 01/30/2014  . Elevated LFTs 01/30/2014  . Flatulence/gas pain/belching 12/04/2013  . Abdominal distension 12/04/2013  . GERD (gastroesophageal reflux disease) 10/08/2013  . Ventricular fibrillation ([place]) 07/26/2010  . Implantable defibrillator-[employer] 07/26/2010  . TOBACCO ABUSE 06/08/2009   Cameron Sprang, PT, MPT [facility] [address] [place], [place],  [address] Phone: [phone]   Fax:  [phone] 06/15/16, 12:16  PM  Name: Hiep Ollis MRN: [id number] Date of Birth: 1950/04/19

## 2016-06-19 ENCOUNTER — Ambulatory Visit: Payer: Medicare HMO | Admitting: Physical Therapy

## 2016-06-19 ENCOUNTER — Ambulatory Visit: Payer: Medicare HMO | Admitting: Speech Pathology

## 2016-06-20 ENCOUNTER — Ambulatory Visit: Payer: BLUE CROSS/BLUE SHIELD | Admitting: Family Medicine

## 2016-06-22 ENCOUNTER — Ambulatory Visit: Payer: Medicare HMO | Admitting: Speech Pathology

## 2016-06-22 DIAGNOSIS — R471 Dysarthria and anarthria: Secondary | ICD-10-CM

## 2016-06-22 DIAGNOSIS — R2681 Unsteadiness on feet: Secondary | ICD-10-CM | POA: Diagnosis not present

## 2016-06-22 DIAGNOSIS — R2689 Other abnormalities of gait and mobility: Secondary | ICD-10-CM | POA: Diagnosis not present

## 2016-06-22 DIAGNOSIS — R293 Abnormal posture: Secondary | ICD-10-CM | POA: Diagnosis not present

## 2016-06-22 NOTE — Therapy (Signed)
[facility] [address] [place], [place], [address] Phone: [phone]   Fax:  [phone]  Speech Language Pathology Discharge Summary  Patient Details  Name: Jeremy Walker MRN: [id number] Date of Birth: 12/29/1950 Referring Provider: Alonza Bogus, DO  Encounter Date: 06/22/2016      End of Session - 06/22/16 1215    Visit Number 12   Number of Visits 17   Date for SLP Re-Evaluation 07/03/16   SLP Start Time 1101   SLP Stop Time  1143   SLP Time Calculation (min) 42 min   Activity Tolerance Patient tolerated treatment well      Past Medical History:  Diagnosis Date  . AICD (automatic cardioverter/defibrillator) present    Abdominal implant was 0064 Guidant lead 6/5 mm  . Anxiety   . Atrial fibrillation ([place])   . CAD (coronary artery disease) 03/2015   a. DESx2 to mid and distal RCA  . Childhood asthma   . Chronic combined systolic and diastolic CHF (congestive heart failure) ([place])    a. ischemic CM - EF 40-45 in 12/16 // b. Echo 12/17: Echo 12/17: EF 50-55, normal wall motion, grade 1 diastolic dysfunction  . Depression   . GERD (gastroesophageal reflux disease)   . History of hiatal hernia   . HLD (hyperlipidemia)   . Hypokalemia    Recurrent associated with cardiac arrest  . Hypothyroidism   . Leg pain    ABIs 12/17: normal   . Sudden cardiac death ([place]) 1994   aborted  . Thyroid disease   . Tobacco abuse    02-16-14 past hx. heavy smoker, quit < 71yr    Past Surgical History:  Procedure Laterality Date  . CARDIAC CATHETERIZATION N/A 04/08/2015   Procedure: Left Heart Cath and Coronary Angiography;  Surgeon: CBurnell Blanks MD;  [facility];  Service: Cardiovascular;  Laterality: N/A;  . CARDIAC CATHETERIZATION N/A 04/08/2015   Procedure: Coronary Stent Intervention;  Surgeon: CBurnell Blanks MD;  [facility];  Service: Cardiovascular;  Laterality:  N/A;  . CARDIAC DEFIBRILLATOR PLACEMENT  1991   guidant  . EUS N/A 02/26/2014   Procedure: ESOPHAGEAL ENDOSCOPIC ULTRASOUND (EUS) RADIAL;  Surgeon: DMilus Banister MD;  [facility];  Service: Endoscopy;  Laterality: N/A;  radial linear  . HAND SURGERY Left 1965   "got it caught in a conveyor belt"    There were no vitals filed for this visit.      Subjective Assessment - 06/22/16 1105    Subjective "I do my ahs and the dog looks at me like I'm crazy"   Currently in Pain? No/denies               ADULT SLP TREATMENT - 06/22/16 1106      General Information   Behavior/Cognition Alert;Cooperative;Pleasant mood   Patient Positioning Upright in chair     Treatment Provided   Treatment provided Cognitive-Linquistic     Pain Assessment   Pain Assessment No/denies pain     Cognitive-Linquistic Treatment   Treatment focused on Voice;Dysarthria   Skilled Treatment SLP provided written instruction for maintenance program for voice/dysarthria; patient demonstrated with independence following initial instruction. Spontaneous speech readings at 50cm today average of 71.6dB, which is WNL. SLP facilitated mildly complex conversation in carryover tasks outside of therapy room in noisy environment; patient maintains loudness WNL with >95% intelligibility during 30 minute conversation.      Assessment / Recommendations /  Plan   Plan All goals met;Discharge SLP treatment due to (comment)  All goals met     Progression Toward Goals   Progression toward goals Goals met, education completed, patient discharged from [facility] - 06/22/16 1216    Education provided Yes   Education Details maintenance HEP   Person(s) Educated Patient   Methods Explanation   Comprehension Verbalized understanding;Returned demonstration          SLP Short Term Goals - 06/22/16 1205      SLP SHORT TERM GOAL #1   Title pt will produce loud /a/ with average >80db over 4  sessions   Baseline 1.29.18, 2.12.18, 2.19.18, 2.22.18   Time 1   Period Weeks   Status Achieved     SLP SHORT TERM GOAL #2   Title Pt will demonstrate average of >70dB during structured sentence level speech tasks with rare min A over 2 sessions   Baseline 1.29.18, 2.1.18   Time 1   Period Weeks   Status Achieved     SLP SHORT TERM GOAL #3   Title pt will produce speech >70 dB average while using AB for good vocal quality at the sentence level with rare min A over 2 sessions   Baseline 2.12.18, 2.19.18   Time 1   Period Weeks   Status Achieved     SLP SHORT TERM GOAL #4   Title pt will generate 8 minutes simple to mod complex conversation at average >70dB with min nonverbal cues, over two sessions   Baseline 2.22.18 3.5.18   Time 0   Period Weeks   Status Achieved     SLP SHORT TERM GOAL #5   Title pt will use AB techniques at multiparagraph level with 85% accuracy and rare min A.   Time 2   Period Weeks   Status Achieved          SLP Long Term Goals - 06/22/16 1205      SLP LONG TERM GOAL #1   Title Pt will average 70dB over 15 minute mildly complex conversation with supervision cues   Time 2   Period Weeks   Status Achieved     SLP LONG TERM GOAL #2   Title Pt will converse audibly in noisy environment/outside of therapy room over 15 minutes with supervision cues.   Time 2   Period Weeks   Status Achieved          Plan - 06/22/16 1206    Clinical Impression Statement Pt WNL for loudness during all session tasks today, including spontaneous speech, and 30 minute conversation outside therapy room in noisy environment. Intelligibility >95% for all conversation, speech tasks. Patient observed with adequate self-monitoring, stating, "I'm loud enough, aren't I?" All short and long-term goals have been met; no further skilled ST recommended at this time as patient is demonstrating carryover of trained techniques for loudness, breath support in spontaneous speech  outside of therapy room. Patient educated in maintenance program, follow-up screenings. Pt in agreement with this discharge plan.    Speech Therapy Frequency Other (comment)  No further skilled ST; all goals met   Duration Other (comment)  No further skilled ST; all goals met   Treatment/Interventions Cueing hierarchy;SLP instruction and feedback;Compensatory strategies;Patient/family education;Functional tasks   Potential to Achieve Goals Good   Potential Considerations Medical prognosis   SLP Home Exercise Plan maintenance program: sustained /a/ and reading aloud 5-10 minutes per day  Consulted and Agree with Plan of Care Patient      Patient will benefit from skilled therapeutic intervention in order to improve the following deficits and impairments:   Dysarthria and anarthria      G-Codes - Jul 12, 2016 1221    Functional Assessment Tool Used NOMS 6   Functional Limitations Motor speech   Motor Speech Current Status [phone]) At least 1 percent but less than 20 percent impaired, limited or restricted   Motor Speech Goal Status (V7846) At least 1 percent but less than 20 percent impaired, limited or restricted   Motor Speech Goal Status (N6295) At least 1 percent but less than 20 percent impaired, limited or restricted      Problem List Patient Active Problem List   Diagnosis Date Noted  . Coronary artery disease involving native heart without angina pectoris 03/07/2016  . Chronic combined systolic and diastolic CHF (congestive heart failure) ([place]) 03/07/2016  . Chest pain 09/14/2015  . Rib fracture 09/12/2015  . Abrasion of right arm 09/12/2015  . Foot swelling, Left Ankel  12/15/2014  . Discoloration of skin 12/15/2014  . PVD (peripheral vascular disease) with claudication ([facility] [facility]) 11/20/2014  . Hyperlipidemia 10/13/2014  . Hypothyroidism 10/13/2014  . Leg cramps 10/13/2014  . Physical exam 04/13/2014  . Duodenal diverticulum 02/26/2014  . Bile duct abnormality 02/26/2014  .  Special screening for malignant neoplasms, colon 01/30/2014  . Belching 01/30/2014  . Elevated LFTs 01/30/2014  . Flatulence/gas pain/belching 12/04/2013  . Abdominal distension 12/04/2013  . GERD (gastroesophageal reflux disease) 10/08/2013  . Ventricular fibrillation ([place]) 07/26/2010  . Implantable defibrillator-[employer] 07/26/2010  . TOBACCO ABUSE 06/08/2009   SPEECH THERAPY DISCHARGE SUMMARY  Visits from Start of Care: 12  Current functional level related to goals / functional outcomes: Mr. Hinchliffe communicates intelligibly in most activities. He demonstrates consistent self-monitoring, and uses compensatory strategies when encountering difficulty.    Remaining deficits:  Patient displays some limitations in intelligibility during high-demand vocal activities. He is able to use compensatory strategies when necessary.   Education / Equipment: Patient educated and provided written information for maintenance program. He demonstrates understanding and proper technique for completing these activities independently.   Plan: Patient agrees to discharge.  Patient goals were met. Patient is being discharged due to meeting the stated rehab goals.  ?????      Deneise Lever, [place] CF-SLP Speech-Language Pathologist  Aliene Altes 12-Jul-2016, 12:24 PM  [facility] [address] [place], [place], [address] Phone: [phone]   Fax:  [phone]   Name: Jeremy Walker MRN: [id number] Date of Birth: 14-Jul-1950

## 2016-06-26 ENCOUNTER — Encounter: Payer: BLUE CROSS/BLUE SHIELD | Admitting: Speech Pathology

## 2016-06-27 NOTE — Progress Notes (Signed)
Jeremy Walker was seen today in the movement disorders clinic for neurologic consultation at the request of Jeremy Held, DO.  The consultation is for the evaluation of gait instability.  Pt worried that he may have PD as his mother had PD and he is noticing some gait changes.  Pt states that his balance has been off since cardiac stent placed a year ago.  States that he cannot stand on one foot and has trouble putting on pants.  Only fall was a year ago when cutting down a tree and was pushing it down.    06/29/16 update:  Patient was seen today in follow-up, earlier than expected.  He was started on pramipexole last visit.  He has been attending rehabilitation therapies.  The patient made a sooner follow-up because he did not think that his medication was working.  He has no side effects with medication, but did not think it was working.  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  He is tripping over twigs and the ground as well and uneven.  He never has fallen in the house he has placed nonslip things in the bathroom and showers. He states that he has graduated from physical therapy.  He asks if PD can cause swelling.  He has had LLE edema x 6 months.  Saw vascular and told to wear compression stockings but they were expensive.   CT brain done on 04/19/16 was unremarkable.    ALLERGIES:  No Known Allergies  CURRENT MEDICATIONS:  Outpatient Encounter Prescriptions as of 06/29/2016  Medication Sig  . aspirin 81 MG EC tablet TAKE 1 TABLET(81 MG) BY MOUTH DAILY  . clopidogrel (PLAVIX) 75 MG tablet Take 1 tablet (75 mg total) by mouth daily.  Marland Kitchen levothyroxine (SYNTHROID, LEVOTHROID) 50 MCG tablet Take 1 tablet (50 mcg total) by mouth daily.  . pantoprazole (PROTONIX) 40 MG tablet Take 40 mg by mouth daily.  . pramipexole (MIRAPEX) 0.5 MG tablet  Take 1 tablet (0.5 mg total) by mouth 3 (three) times daily.  . sertraline (ZOLOFT) 50 MG tablet Take 1 tablet (50 mg total) by mouth daily. (Patient taking differently: Take 25 mg by mouth daily. )  . simvastatin (ZOCOR) 40 MG tablet TAKE 1 TABLET(40 MG) BY MOUTH DAILY AT 6 PM  . spironolactone (ALDACTONE) 25 MG tablet Take 0.5 tablets (12.5 mg total) by mouth daily.  Marland Kitchen atenolol (TENORMIN) 25 MG tablet Take 1 tablet (25 mg total) by mouth daily.  . [DISCONTINUED] pramipexole (MIRAPEX) 0.125 MG tablet 1 tablet three times per day for a week, then 2 tablets three times per day for a week, then switch to 0.5 mg tablets   No facility-administered encounter medications on file as of 06/29/2016.     PAST MEDICAL HISTORY:   Past Medical History:  Diagnosis Date  . AICD (automatic cardioverter/defibrillator) present    Abdominal implant was 0064 Guidant lead 6/5 mm  . Anxiety   . Atrial fibrillation ([place])   . CAD (coronary artery disease) 03/2015   a. DESx2 to mid and distal RCA  . Childhood asthma   . Chronic combined systolic and diastolic CHF (congestive heart failure) ([place])    a. ischemic CM - EF 40-45 in 12/16 // b. Echo 12/17: Echo 12/17: EF 50-55, normal wall motion,  grade 1 diastolic dysfunction  . Depression   . GERD (gastroesophageal reflux disease)   . History of hiatal hernia   . HLD (hyperlipidemia)   . Hypokalemia    Recurrent associated with cardiac arrest  . Hypothyroidism   . Leg pain    ABIs 12/17: normal   . Sudden cardiac death ([place]) 1994   aborted  . Thyroid disease   . Tobacco abuse    02-16-14 past hx. heavy smoker, quit < 68yr.    PAST SURGICAL HISTORY:   Past Surgical History:  Procedure Laterality Date  . CARDIAC CATHETERIZATION N/A 04/08/2015   Procedure: Left Heart Cath and Coronary Angiography;  Surgeon: Burnell Blanks, MD;  Location: [facility];  Service: Cardiovascular;  Laterality: N/A;  . CARDIAC CATHETERIZATION N/A 04/08/2015    Procedure: Coronary Stent Intervention;  Surgeon: Burnell Blanks, MD;  Location: [facility];  Service: Cardiovascular;  Laterality: N/A;  . CARDIAC DEFIBRILLATOR PLACEMENT  1991   guidant  . EUS N/A 02/26/2014   Procedure: ESOPHAGEAL ENDOSCOPIC ULTRASOUND (EUS) RADIAL;  Surgeon: Milus Banister, MD;  [facility];  Service: Endoscopy;  Laterality: N/A;  radial linear  . HAND SURGERY Left 1965   "got it caught in a conveyor belt"    SOCIAL HISTORY:   Social History   Social History  . Marital status: Married    Spouse name: N/A  . Number of children: 1  . Years of education: N/A   Occupational History  . retired     Games developer   Social History Main Topics  . Smoking status: Former Smoker    Packs/day: 1.50    Years: 42.00    Types: Cigarettes    Quit date: 02/04/2013  . Smokeless tobacco: Never Used  . Alcohol use No     Comment: "stopped drinking in 01/2013"- prior heavy with beer  . Drug use: No     Comment: "none since 1971"  . Sexual activity: Not Currently   Other Topics Concern  . Not on file   Social History Narrative  . No narrative on file    FAMILY HISTORY:   Family Status  Relation Status  . Brother Deceased  . Father Deceased at age 15   Lymphoma  . Mother Deceased at age 64  . Brother   . Maternal Aunt Deceased  . Maternal Grandmother Deceased  . Maternal Grandfather Deceased  . Paternal Grandmother Deceased  . Paternal Grandfather Deceased    ROS:  No CP/SOB.  No lateralizing weakness or paresthesias.  No diplopia.  No swallowing difficulties.  No paresthesias of the feet.  A complete 10 system review of systems was obtained and was unremarkable apart from what is mentioned above.  PHYSICAL EXAMINATION:    VITALS:   Vitals:   06/29/16 1422  Weight: 195 lb (88.5 kg)  Height: 6' (1.829 m)   Orthostatic VS for the past 24 hrs (Last 3 readings):  BP- Lying Pulse- Lying BP- Sitting Pulse- Sitting BP- Standing  at 0 minutes Pulse- Standing at 0 minutes  06/29/16 1423 110/70 58 112/80 58 138/84 55    GEN:  The patient appears stated age and is in NAD. HEENT:  Normocephalic, atraumatic.  The mucous membranes are moist. The superficial temporal arteries are without ropiness or tenderness. CV:  Bradycardic.  regular Lungs:  CTAB Neck/HEME:  There are no carotid bruits bilaterally. Skin: There is lower extremity edema on the left.  There is no  Homans or Moses..  Neurological examination:  Orientation: The patient is alert and oriented x3.  Cranial nerves: There is good facial symmetry.  There is facial hypomimia. Extraocular muscles are intact. The visual fields are full to confrontational testing. The speech is fluent and clear. Soft palate rises symmetrically and there is no tongue deviation. Hearing is intact to conversational tone. Sensation: Sensation is intact to light touch throughout. Motor: Strength is 5/5 in the bilateral upper and lower extremities.   Shoulder shrug is equal and symmetric.  There is no pronator drift. Deep tendon reflexes: Deep tendon reflexes are 3/4 at the bilateral biceps, triceps, brachioradialis, patella and 4/4 at the bilateral achilles with 4 beats of ankle clonus at the left and 2 at the right. Plantar responses are downgoing bilaterally.  Movement examination: Tone: There is normal tone in the UE Abnormal movements: no tremor noted Coordination:  There is only decremation with alternation of supination/pronation of the forearm on the L and toe taps on the L Gait and Station: The patient has no difficulty arising out of a deep-seated chair without the use of the hands. The patient's stride length is normal.  He initially gets up and walks without the cane.  He picks up his cane, but really does not use it.  LABS  Lab Results  Component Value Date   TSH 2.39 04/14/2016     Chemistry      Component Value Date/Time   NA 140 03/07/2016 1132   K 4.7 03/07/2016  1132   CL 104 03/07/2016 1132   CO2 28 03/07/2016 1132   BUN 20 03/07/2016 1132   CREATININE 0.86 03/07/2016 1132      Component Value Date/Time   CALCIUM 9.3 03/07/2016 1132   ALKPHOS 83 09/07/2015 1633   AST 23 09/07/2015 1633   ALT 22 09/07/2015 1633   BILITOT 0.9 09/07/2015 1633     Lab Results  Component Value Date   VITAMINB12 409 04/14/2016    Lab Results  Component Value Date   HGBA1C 5.9 (H) 04/14/2016      ASSESSMENT/PLAN:  1.  Mild idiopathic Parkinson's disease.  The patient has tremor, bradykinesia, mild rigidity and mild postural instability.  Family history of Parkinson's disease in his mother.  -Clinically, the patient looks better on pramipexole 0.5mg  3 times per day.  He has no side effects with the medication.  He will continue on it.  -Explained to the patient that there is no medication that is going to help him with the type of balance that he wants helpless.  He wants to be able to clear very uneven land at his house and work in the [facility].  This is likely just not safe for him.  -He asked me if he can participate with the community exercise programs.  I had previously emailed his cardiologist, but had not heard back.  We will call them today.  I do think, even if he cannot participate with the biking more boxing program, he should be able to participate at [facility] given that this is mostly strengthening and balance programs.  Information was given on that today.  2.  Left lower extremity edema.  -Patient states that has been going on for 6 months, long before he started pramipexole.  He has already seen vascular surgery and was told to wear compression stockings.  He found them expensive.  He was given the phone number to elastic therapy in [place], where it is generally cheaper.  3.  Hyperreflexia  -is very significant that may be due to recurrent cardiac arrest years ago, although I do not know the length of down time.  He is unable to have an MRI of the  brain because of defibrillator.  He has no neck or back pain, so no need to consider imaging of the spine.  Bladder and bowel are functioning normally.  -going to do CT brain to make sure not missing anything.    4.  Lightheadedness  -Improved off lisinopril.  Not orthostatic today.  5.  Follow-up in the next few months, sooner should new neurologic issues arise.  Much greater than 50% of this visit was spent in counseling and coordinating care.  Total face to face time:  30 min   Cc:  Jeremy Held, DO

## 2016-06-29 ENCOUNTER — Ambulatory Visit: Payer: BLUE CROSS/BLUE SHIELD | Admitting: Physical Therapy

## 2016-06-29 ENCOUNTER — Telehealth: Payer: Self-pay | Admitting: Internal Medicine

## 2016-06-29 ENCOUNTER — Ambulatory Visit (INDEPENDENT_AMBULATORY_CARE_PROVIDER_SITE_OTHER): Payer: Medicare HMO | Admitting: Neurology

## 2016-06-29 ENCOUNTER — Encounter: Payer: Self-pay | Admitting: Neurology

## 2016-06-29 ENCOUNTER — Ambulatory Visit: Payer: Medicare HMO | Admitting: Speech Pathology

## 2016-06-29 VITALS — Ht 72.0 in | Wt 195.0 lb

## 2016-06-29 DIAGNOSIS — G20 Parkinson's disease: Secondary | ICD-10-CM

## 2016-06-29 DIAGNOSIS — R292 Abnormal reflex: Secondary | ICD-10-CM

## 2016-06-29 NOTE — Telephone Encounter (Signed)
Jeremy Walker with Dr. Wells Guiles Tat';s office would like to verify that it is okay for patient to start Cardiovascular exercise. [name] can be reached at [phone]. Thanks.

## 2016-06-29 NOTE — Patient Instructions (Addendum)
1. You can get compression stockings at [facility] in [place]  2. We have left a message with Dr. Olin Pia office about exercise.

## 2016-06-29 NOTE — Telephone Encounter (Signed)
I returned Jade's call.  I left voicemail that Dr. Caryl Comes would not return to office until Tuesday, would send message, and cb with recommendations.

## 2016-07-03 NOTE — Telephone Encounter (Signed)
Ok to start exercise

## 2016-07-04 ENCOUNTER — Telehealth: Payer: Self-pay | Admitting: Neurology

## 2016-07-04 NOTE — Telephone Encounter (Signed)
I left message on Jade's voicemail (Dr. Doristine Devoid CMA) advised recommendations and to call back if further information is needed.

## 2016-07-04 NOTE — Telephone Encounter (Signed)
Received message from [facility] with Dr. Caryl Comes that patient is cleared for exercise. Patient made aware.

## 2016-07-10 ENCOUNTER — Telehealth: Payer: Self-pay | Admitting: Neurology

## 2016-07-10 NOTE — Telephone Encounter (Signed)
Spoke with patient and he states he got confused at his appt with Dr. Carles Collet and swelling in left hand and leg only started 3 months ago when he started Pramipexole.  He states he got compression stocking and wore them for a week with no improvement.  He is now beginning to have swelling in his right foot.  He would like to switch Parkinson's medications.   Please advise.

## 2016-07-10 NOTE — Telephone Encounter (Signed)
Received after hours note from this weekend stating the following:  "Caller states is having swelling in left leg and knuckles. Explained to Dr. Carles Collet, has been happening for three months. Pramipexole 0.5 mg TID. Wants to let the doctor know that he is having swelling."  States his chronic conditions are: Parkinson's Disease, Defibrillator, blockage, stent and sudden adult death syndrome.  States spoke with Belenda Cruise, RN and complained of leg swelling/edema with entire foot cool or blue compared to the other side.  He was instructed to go to the ER.

## 2016-07-10 NOTE — Telephone Encounter (Signed)
I reviewed records.  He saw vascular sx in 12/2014 for L leg swelling and had u/s in 03/2016 of the leg for same thing.  I didn't put him on mirapex until 04/2016.  In addition, if mirapex were to cause swelling it would not be just of one leg.  I don't think that this is from the med

## 2016-07-10 NOTE — Telephone Encounter (Signed)
Patient called again wanting to check the status of when someone was going to call him back please call patient at [phone]

## 2016-07-10 NOTE — Telephone Encounter (Signed)
Pt called left message that he wants to talk to Dr. Carles Collet about changing meds.

## 2016-07-10 NOTE — Telephone Encounter (Signed)
Tried to call patient to discuss and he could not hear me. Will try again later.

## 2016-07-11 NOTE — Telephone Encounter (Signed)
Left message on machine for patient to call back.

## 2016-07-11 NOTE — Telephone Encounter (Signed)
Patient made aware.

## 2016-07-18 ENCOUNTER — Ambulatory Visit: Payer: BLUE CROSS/BLUE SHIELD | Admitting: Neurology

## 2016-07-25 ENCOUNTER — Other Ambulatory Visit: Payer: Self-pay | Admitting: Family Medicine

## 2016-07-25 ENCOUNTER — Telehealth: Payer: Self-pay | Admitting: Family Medicine

## 2016-07-25 ENCOUNTER — Telehealth: Payer: Self-pay | Admitting: Physician Assistant

## 2016-07-25 DIAGNOSIS — E785 Hyperlipidemia, unspecified: Secondary | ICD-10-CM

## 2016-07-25 DIAGNOSIS — I2510 Atherosclerotic heart disease of native coronary artery without angina pectoris: Secondary | ICD-10-CM

## 2016-07-25 DIAGNOSIS — F32A Depression, unspecified: Secondary | ICD-10-CM

## 2016-07-25 DIAGNOSIS — I10 Essential (primary) hypertension: Secondary | ICD-10-CM

## 2016-07-25 DIAGNOSIS — F329 Major depressive disorder, single episode, unspecified: Secondary | ICD-10-CM

## 2016-07-25 MED ORDER — SPIRONOLACTONE 25 MG PO TABS: 12.5000 mg | ORAL_TABLET | Freq: Every day | ORAL | 0 refills | Status: DC

## 2016-07-25 MED ORDER — LEVOTHYROXINE SODIUM 50 MCG PO TABS: 50.0000 ug | ORAL_TABLET | Freq: Every day | ORAL | 0 refills | Status: DC

## 2016-07-25 MED ORDER — SIMVASTATIN 40 MG PO TABS: 40.0000 mg | ORAL_TABLET | Freq: Every day | ORAL | 0 refills | Status: DC

## 2016-07-25 MED ORDER — SERTRALINE HCL 50 MG PO TABS: 50.0000 mg | ORAL_TABLET | Freq: Every day | ORAL | 0 refills | Status: DC

## 2016-07-25 MED ORDER — PANTOPRAZOLE SODIUM 40 MG PO TBEC: 40.0000 mg | DELAYED_RELEASE_TABLET | Freq: Every day | ORAL | 0 refills | Status: DC

## 2016-07-25 NOTE — Telephone Encounter (Signed)
I called pt back and went over recommendation to either f/u PCP or Cardiology since leg edema seems to be more one sided. Pt says he gets sob when he swings an Ax, otherwise just talking to people he is ok. Pt states his weight is doing fine. He states his last weight he had was 190 lb, but now he states he is loosing some weight. Pt states he would like to f/u with PCP 08/01/16 first and discuss with PCP and then maybe still f/u with Cardiology. I advised pt if his swelling becomes worse before his appt with PCP 08/01/16 to try and get sooner with PCP or call or our office for an appt. Pt agreeable to plan of care and thanked me for my call today.

## 2016-07-25 NOTE — Telephone Encounter (Signed)
Sent in as requested 

## 2016-07-25 NOTE — Telephone Encounter (Signed)
Relation to DY:JWLK Call back [place]: [facility] Order   Reason for call:  Patient would like all medications sent to [facility] 90 day supply due to insurance changing to [facility]

## 2016-07-25 NOTE — Telephone Encounter (Signed)
New message      Pt c/o medication issue:  1. Name of Medication:  lisinopril 2. How are you currently taking this medication (dosage and times per day)?  2.5mg  daily  3. Are you having a reaction (difficulty breathing--STAT)?  no  4. What is your medication issue?  Can this medication cause leg swelling----from the knee down?  Pt had stopped this medication but he restarted it a few days ago to see if it would help the swelling.  Please call

## 2016-07-25 NOTE — Telephone Encounter (Signed)
I returned pt's call about leg swelling. Pt complains of left leg edema, more at the ankle area. Pt states he has had this since January. LEA w/ABI's were done 03/2016, which were normal. Pt states Dr. Caryl Comes took him off the Lisinopril and since then he has had swelling. Pt denies any redness, soreness, leaking of fluid, or hot feeling to left leg. Pt has appt with PCP 08/01/16. Pt states he tried taking the Lisinopril 2.5 mg daily again for a few days to see if it would help the swelling. Pt states it did not help the swelling. Pt states he also went ahead and reordered the Lisinopril 2.5 mg tablet from [facility] due to he states he gets it at no cost; pt states he felt that if he had to go back on it he would be all set. Pt would like to know of any recommendations from [place], [facility]. I advised pt I will d/w PA and call back as soon as I can. Pt thanked me for my call today.

## 2016-07-25 NOTE — Telephone Encounter (Signed)
I do not think Lisinopril is playing a role. He should have his edema evaluated, especially since it is one sided. He can see his PCP or [place]. Is he short of breath?  Has his weight gone up? Richardson Dopp, PA-C    07/25/2016 2:29 PM

## 2016-07-27 ENCOUNTER — Other Ambulatory Visit: Payer: Self-pay | Admitting: *Deleted

## 2016-07-27 NOTE — Addendum Note (Signed)
Addended by: Kem Boroughs D on: 07/27/2016 05:13 PM   Modules accepted: Orders

## 2016-08-01 ENCOUNTER — Encounter: Payer: Self-pay | Admitting: Family Medicine

## 2016-08-01 ENCOUNTER — Ambulatory Visit (INDEPENDENT_AMBULATORY_CARE_PROVIDER_SITE_OTHER): Payer: Medicare HMO | Admitting: Family Medicine

## 2016-08-01 ENCOUNTER — Telehealth: Payer: Self-pay | Admitting: Family Medicine

## 2016-08-01 VITALS — BP 122/68 | HR 56 | Temp 97.7°F | Resp 17 | Ht 62.0 in | Wt 191.4 lb

## 2016-08-01 DIAGNOSIS — I739 Peripheral vascular disease, unspecified: Secondary | ICD-10-CM | POA: Diagnosis not present

## 2016-08-01 DIAGNOSIS — I5042 Chronic combined systolic (congestive) and diastolic (congestive) heart failure: Secondary | ICD-10-CM

## 2016-08-01 DIAGNOSIS — G20 Parkinson's disease: Secondary | ICD-10-CM | POA: Diagnosis not present

## 2016-08-01 DIAGNOSIS — E038 Other specified hypothyroidism: Secondary | ICD-10-CM | POA: Diagnosis not present

## 2016-08-01 DIAGNOSIS — I2510 Atherosclerotic heart disease of native coronary artery without angina pectoris: Secondary | ICD-10-CM

## 2016-08-01 DIAGNOSIS — K219 Gastro-esophageal reflux disease without esophagitis: Secondary | ICD-10-CM

## 2016-08-01 DIAGNOSIS — R600 Localized edema: Secondary | ICD-10-CM | POA: Diagnosis not present

## 2016-08-01 DIAGNOSIS — E782 Mixed hyperlipidemia: Secondary | ICD-10-CM | POA: Diagnosis not present

## 2016-08-01 LAB — COMPREHENSIVE METABOLIC PANEL
ALK PHOS: 81 U/L (ref 39–117)
ALT: 17 U/L (ref 0–53)
AST: 19 U/L (ref 0–37)
Albumin: 4.2 g/dL (ref 3.5–5.2)
BILIRUBIN TOTAL: 0.3 mg/dL (ref 0.2–1.2)
BUN: 19 mg/dL (ref 6–23)
CO2: 29 meq/L (ref 19–32)
CREATININE: 0.78 mg/dL (ref 0.40–1.50)
Calcium: 9.3 mg/dL (ref 8.4–10.5)
Chloride: 106 mEq/L (ref 96–112)
GFR: 106.14 mL/min (ref >60.00)
GLUCOSE: 92 mg/dL (ref 70–99)
Potassium: 4.4 mEq/L (ref 3.5–5.1)
SODIUM: 140 meq/L (ref 135–145)
TOTAL PROTEIN: 6.7 g/dL (ref 6.0–8.3)

## 2016-08-01 LAB — LDL CHOLESTEROL, DIRECT: Direct LDL: 75 mg/dL

## 2016-08-01 LAB — TSH: TSH: 4.2 u[IU]/mL (ref 0.35–4.50)

## 2016-08-01 LAB — CBC
HCT: 41.7 % (ref 39.0–52.0)
Hemoglobin: 13.9 g/dL (ref 13.0–17.0)
MCHC: 33.3 g/dL (ref 30.0–36.0)
MCV: 97.3 fl (ref 78.0–100.0)
Platelets: 128 10*3/uL — ABNORMAL LOW (ref 150.0–400.0)
RBC: 4.29 Mil/uL (ref 4.22–5.81)
RDW: 13.2 % (ref 11.5–15.5)
WBC: 4 10*3/uL (ref 4.0–10.5)

## 2016-08-01 LAB — LIPID PANEL
Cholesterol: 139 mg/dL (ref 0–200)
HDL: 44.4 mg/dL (ref >39.00)
NONHDL: 94.87
TRIGLYCERIDES: 210 mg/dL — AB (ref 0.0–149.0)
Total CHOL/HDL Ratio: 3
VLDL: 42 mg/dL — ABNORMAL HIGH (ref 0.0–40.0)

## 2016-08-01 MED ORDER — PRAMIPEXOLE DIHYDROCHLORIDE 0.5 MG PO TABS: 0.5000 mg | ORAL_TABLET | Freq: Three times a day (TID) | ORAL | 2 refills | Status: DC

## 2016-08-01 NOTE — Assessment & Plan Note (Signed)
Per cardiology 

## 2016-08-01 NOTE — Telephone Encounter (Signed)
Pt called to inform that we did not update med list correctly. He is also taking pantaprozole 40mg  2x per day. Pt does not currently need the medication filled. He needs a complete med list sent to [facility].

## 2016-08-01 NOTE — Assessment & Plan Note (Signed)
Tolerating statin, encouraged heart healthy diet, avoid trans fats, minimize simple carbs and saturated fats. Increase exercise as tolerated 

## 2016-08-01 NOTE — Assessment & Plan Note (Signed)
f/u cardiology

## 2016-08-01 NOTE — Patient Instructions (Addendum)
Take whole Spirolactone for 3 days and then back to half.  Edema Edema is an abnormal buildup of fluids in your bodytissues. Edema is somewhatdependent on gravity to pull the fluid to the lowest place in your body. That makes the condition more common in the legs and thighs (lower extremities). Painless swelling of the feet and ankles is common and becomes more likely as you get older. It is also common in looser tissues, like around your eyes. When the affected area is squeezed, the fluid may move out of that spot and leave a dent for a few moments. This dent is called pitting. What are the causes? There are many possible causes of edema. Eating too much salt and being on your feet or sitting for a long time can cause edema in your legs and ankles. Hot weather may make edema worse. Common medical causes of edema include:  Heart failure.  Liver disease.  Kidney disease.  Weak blood vessels in your legs.  Cancer.  An injury.  Pregnancy.  Some medications.  Obesity. What are the signs or symptoms? Edema is usually painless.Your skin may look swollen or shiny. How is this diagnosed? Your health care provider may be able to diagnose edema by asking about your medical history and doing a physical exam. You may need to have tests such as X-rays, an electrocardiogram, or blood tests to check for medical conditions that may cause edema. How is this treated? Edema treatment depends on the cause. If you have heart, liver, or kidney disease, you need the treatment appropriate for these conditions. General treatment may include:  Elevation of the affected body part above the level of your heart.  Compression of the affected body part. Pressure from elastic bandages or support stockings squeezes the tissues and forces fluid back into the blood vessels. This keeps fluid from entering the tissues.  Restriction of fluid and salt intake.  Use of a water pill (diuretic). These medications are  appropriate only for some types of edema. They pull fluid out of your body and make you urinate more often. This gets rid of fluid and reduces swelling, but diuretics can have side effects. Only use diuretics as directed by your health care provider. Follow these instructions at home:  Keep the affected body part above the level of your heart when you are lying down.  Do not sit still or stand for prolonged periods.  Do not put anything directly under your knees when lying down.  Do not wear constricting clothing or garters on your upper legs.  Exercise your legs to work the fluid back into your blood vessels. This may help the swelling go down.  Wear elastic bandages or support stockings to reduce ankle swelling as directed by your health care provider.  Eat a low-salt diet to reduce fluid if your health care provider recommends it.  Only take medicines as directed by your health care provider. Contact a health care provider if:  Your edema is not responding to treatment.  You have heart, liver, or kidney disease and notice symptoms of edema.  You have edema in your legs that does not improve after elevating them.  You have sudden and unexplained weight gain. Get help right away if:  You develop shortness of breath or chest pain.  You cannot breathe when you lie down.  You develop pain, redness, or warmth in the swollen areas.  You have heart, liver, or kidney disease and suddenly get edema.  You have a  fever and your symptoms suddenly get worse. This information is not intended to replace advice given to you by your health care provider. Make sure you discuss any questions you have with your health care provider. Document Released: 03/27/2005 Document Revised: 09/02/2015 Document Reviewed: 01/17/2013 Elsevier Interactive Patient Education  2017 Elsevier Inc.   Cholesterol Cholesterol is a white, waxy, fat-like substance that is needed by the human body in small amounts.  The liver makes all the cholesterol we need. Cholesterol is carried from the liver by the blood through the blood vessels. Deposits of cholesterol (plaques) may build up on blood vessel (artery) walls. Plaques make the arteries narrower and stiffer. Cholesterol plaques increase the risk for heart attack and stroke. You cannot feel your cholesterol level even if it is very high. The only way to know that it is high is to have a blood test. Once you know your cholesterol levels, you should keep a record of the test results. Work with your health care provider to keep your levels in the desired range. What do the results mean?  Total cholesterol is a rough measure of all the cholesterol in your blood.  LDL (low-density lipoprotein) is the "bad" cholesterol. This is the type that causes plaque to build up on the artery walls. You want this level to be low.  HDL (high-density lipoprotein) is the "good" cholesterol because it cleans the arteries and carries the LDL away. You want this level to be high.  Triglycerides are fat that the body can either burn for energy or store. High levels are closely linked to heart disease. What are the desired levels of cholesterol?  Total cholesterol below 200.  LDL below 100 for people who are at risk, below 70 for people at very high risk.  HDL above 40 is good. A level of 60 or higher is considered to be protective against heart disease.  Triglycerides below 150. How can I lower my cholesterol? Diet  Follow your diet program as told by your health care provider.  Choose fish or white meat chicken and [place], roasted or baked. Limit fatty cuts of red meat, fried foods, and processed meats, such as sausage and lunch meats.  Eat lots of fresh fruits and vegetables.  Choose whole grains, beans, pasta, potatoes, and cereals.  Choose olive oil, corn oil, or canola oil, and use only small amounts.  Avoid butter, mayonnaise, shortening, or palm kernel  oils.  Avoid foods with trans fats.  Drink skim or nonfat milk and eat low-fat or nonfat yogurt and cheeses. Avoid whole milk, cream, ice cream, egg yolks, and full-fat cheeses.  Healthier desserts include angel food cake, ginger snaps, animal crackers, hard candy, popsicles, and low-fat or nonfat frozen yogurt. Avoid pastries, cakes, pies, and cookies. Exercise  Follow your exercise program as told by your health care provider. A regular program:  Helps to decrease LDL and raise HDL.  Helps with weight control.  Do things that increase your activity level, such as gardening, walking, and taking the stairs.  Ask your health care provider about ways that you can be more active in your daily life. Medicine  Take over-the-counter and prescription medicines only as told by your health care provider.  Medicine may be prescribed by your health care provider to help lower cholesterol and decrease the risk for heart disease. This is usually done if diet and exercise have failed to bring down cholesterol levels.  If you have several risk factors, you may need medicine  even if your levels are normal. This information is not intended to replace advice given to you by your health care provider. Make sure you discuss any questions you have with your health care provider. Document Released: 12/20/2000 Document Revised: 10/23/2015 Document Reviewed: 09/25/2015 Elsevier Interactive Patient Education  2017 [employer].

## 2016-08-01 NOTE — Progress Notes (Signed)
Subjective:  I acted as a Education administrator for Dr. Royden Purl, LPN    Patient ID: Jeremy Walker, male    DOB: 29-Feb-1951, 66 y.o.   MRN: [id number]  Chief Complaint  Patient presents with  . Hyperlipidemia    follow up  . Hypothyroidism    follow up    Hyperlipidemia  This is a chronic problem. The current episode started more than 1 year ago. Pertinent negatives include no chest pain.    Patient is in today for follow up hyperlipidemia, and hypothyroidism. Patient report he's no longer taking Protonix, or Zoloft. Patient report his left leg has been swollen for about 2 months. Patient report he tried wearing compression socks for a week and feels it didn't work. Patient also report     Patient Care Team: Ann Held, DO as PCP - General (Family Medicine) Deboraha Sprang, MD as Consulting Physician (Cardiology) Jerene Bears, MD as Consulting Physician (Gastroenterology) Burnell Blanks, MD as Consulting Physician (Cardiology) Angelia Mould, MD as Consulting Physician (Vascular Surgery)   Past Medical History:  Diagnosis Date  . AICD (automatic cardioverter/defibrillator) present    Abdominal implant was 0064 Guidant lead 6/5 mm  . Anxiety   . Atrial fibrillation ([place])   . CAD (coronary artery disease) 03/2015   a. DESx2 to mid and distal RCA  . Childhood asthma   . Chronic combined systolic and diastolic CHF (congestive heart failure) ([place])    a. ischemic CM - EF 40-45 in 12/16 // b. Echo 12/17: Echo 12/17: EF 50-55, normal wall motion, grade 1 diastolic dysfunction  . Depression   . GERD (gastroesophageal reflux disease)   . History of hiatal hernia   . HLD (hyperlipidemia)   . Hypokalemia    Recurrent associated with cardiac arrest  . Hypothyroidism   . Leg pain    ABIs 12/17: normal   . Sudden cardiac death ([place]) 1994   aborted  . Thyroid disease   . Tobacco abuse    02-16-14 past hx. heavy smoker, quit < 29yr.    Past Surgical History:    Procedure Laterality Date  . CARDIAC CATHETERIZATION N/A 04/08/2015   Procedure: Left Heart Cath and Coronary Angiography;  Surgeon: Burnell Blanks, MD;  Location: [place] [facility];  Service: Cardiovascular;  Laterality: N/A;  . CARDIAC CATHETERIZATION N/A 04/08/2015   Procedure: Coronary Stent Intervention;  Surgeon: Burnell Blanks, MD;  Location: [place] [facility];  Service: Cardiovascular;  Laterality: N/A;  . CARDIAC DEFIBRILLATOR PLACEMENT  1991   guidant  . EUS N/A 02/26/2014   Procedure: ESOPHAGEAL ENDOSCOPIC ULTRASOUND (EUS) RADIAL;  Surgeon: Milus Banister, MD;  Location: [facility];  Service: Endoscopy;  Laterality: N/A;  radial linear  . HAND SURGERY Left 1965   "got it caught in a conveyor belt"    Family History  Problem Relation Age of Onset  . Lung cancer Brother     smoker  . Cancer Father     bladder or kidney, mets  . Parkinson's disease Mother   . Scoliosis Mother   . Ovarian cancer Mother   . Diabetes Mother   . Heart failure Mother     CHF  . Diabetes Brother   . Diabetes Maternal Aunt     Social History   Social History  . Marital status: Married    Spouse name: N/A  . Number of children: 1  . Years of education: N/A   Occupational History  .  retired     Games developer   Social History Main Topics  . Smoking status: Former Smoker    Packs/day: 1.50    Years: 42.00    Types: Cigarettes    Quit date: 02/04/2013  . Smokeless tobacco: Never Used  . Alcohol use No     Comment: "stopped drinking in 01/2013"- prior heavy with beer  . Drug use: No     Comment: "none since 1971"  . Sexual activity: Not Currently   Other Topics Concern  . Not on file   Social History Narrative  . No narrative on file    Outpatient Medications Prior to Visit  Medication Sig Dispense Refill  . aspirin 81 MG EC tablet TAKE 1 TABLET(81 MG) BY MOUTH DAILY 90 tablet 3  . clopidogrel (PLAVIX) 75 MG tablet Take 1 tablet (75 mg total) by  mouth daily. 90 tablet 3  . levothyroxine (SYNTHROID, LEVOTHROID) 50 MCG tablet Take 1 tablet (50 mcg total) by mouth daily. 90 tablet 0  . simvastatin (ZOCOR) 40 MG tablet Take 1 tablet (40 mg total) by mouth daily at 6 PM. 90 tablet 0  . spironolactone (ALDACTONE) 25 MG tablet Take 0.5 tablets (12.5 mg total) by mouth daily. 45 tablet 0  . pramipexole (MIRAPEX) 0.5 MG tablet Take 1 tablet (0.5 mg total) by mouth 3 (three) times daily. 90 tablet 2  . atenolol (TENORMIN) 25 MG tablet Take 1 tablet (25 mg total) by mouth daily. 90 tablet 3  . pantoprazole (PROTONIX) 40 MG tablet Take 1 tablet (40 mg total) by mouth daily. (Patient not taking: Reported on 08/01/2016) 90 tablet 0  . sertraline (ZOLOFT) 50 MG tablet Take 1 tablet (50 mg total) by mouth daily. (Patient not taking: Reported on 08/01/2016) 90 tablet 0   No facility-administered medications prior to visit.     No Known Allergies  Review of Systems  Constitutional: Negative for fever.  HENT: Negative for congestion.   Eyes: Negative for blurred vision.  Respiratory: Negative for cough.   Cardiovascular: Negative for chest pain and palpitations.  Gastrointestinal: Negative for vomiting.  Musculoskeletal: Negative for back pain.  Skin: Negative for rash.  Neurological: Negative for loss of consciousness and headaches.       Objective:    Physical Exam  Constitutional: He is oriented to person, place, and time. He appears well-developed and well-nourished. No distress.  HENT:  Head: Normocephalic and atraumatic.  Eyes: Conjunctivae are normal.  Neck: Normal range of motion. No thyromegaly present.  Cardiovascular: Normal rate and regular rhythm.   Pulmonary/Chest: Effort normal and breath sounds normal. He has no wheezes.  Abdominal: Soft. Bowel sounds are normal. There is no tenderness.  Musculoskeletal: Normal range of motion. He exhibits no edema or deformity.  Neurological: He is alert and oriented to person, place, and  time.  Skin: Skin is warm and dry. He is not diaphoretic.  Psychiatric: He has a normal mood and affect.    BP 122/68 (BP Location: Right Arm, Patient Position: Sitting, Cuff Size: Normal)   Pulse (!) 56   Temp 97.7 F (36.5 C) (Oral)   Resp 17   Ht 5\' 2"  (1.575 m)   Wt 191 lb 6.4 oz (86.8 kg)   SpO2 96%   BMI 35.01 kg/m  Wt Readings from Last 3 Encounters:  08/01/16 191 lb 6.4 oz (86.8 kg)  06/29/16 195 lb (88.5 kg)  04/14/16 189 lb (85.7 kg)   BP Readings from Last 3 Encounters:  08/01/16 122/68  04/14/16 112/72  03/28/16 122/70     Immunization History  Administered Date(s) Administered  . Influenza, High Dose Seasonal PF 12/21/2015  . Influenza,inj,Quad PF,36+ Mos 02/12/2014, 05/06/2015  . Tdap 04/13/2014  . Zoster 04/13/2014    Health Maintenance  Topic Date Due  . FOOT EXAM  06/21/1961  . OPHTHALMOLOGY EXAM  06/21/1961  . URINE MICROALBUMIN  06/21/1961  . PNA vac Low Risk Adult (1 of 2 - PCV13) 06/21/2016  . HEMOGLOBIN A1C  10/12/2016  . INFLUENZA VACCINE  11/08/2016  . COLONOSCOPY  03/12/2019  . TETANUS/TDAP  04/13/2024  . Hepatitis C Screening  Completed  . HIV Screening  Completed    Lab Results  Component Value Date   WBC 4.0 08/01/2016   HGB 13.9 08/01/2016   HCT 41.7 08/01/2016   PLT 128.0 (L) 08/01/2016   GLUCOSE 92 08/01/2016   CHOL 139 08/01/2016   TRIG 210.0 (H) 08/01/2016   HDL 44.40 08/01/2016   LDLDIRECT 75.0 08/01/2016   LDLCALC 73 08/05/2015   ALT 17 08/01/2016   AST 19 08/01/2016   NA 140 08/01/2016   K 4.4 08/01/2016   CL 106 08/01/2016   CREATININE 0.78 08/01/2016   BUN 19 08/01/2016   CO2 29 08/01/2016   TSH 4.20 08/01/2016   PSA 0.98 05/06/2015   INR 0.96 09/07/2015   HGBA1C 5.9 (H) 04/14/2016    Lab Results  Component Value Date   TSH 4.20 08/01/2016   Lab Results  Component Value Date   WBC 4.0 08/01/2016   HGB 13.9 08/01/2016   HCT 41.7 08/01/2016   MCV 97.3 08/01/2016   PLT 128.0 (L) 08/01/2016   Lab  Results  Component Value Date   NA 140 08/01/2016   K 4.4 08/01/2016   CO2 29 08/01/2016   GLUCOSE 92 08/01/2016   BUN 19 08/01/2016   CREATININE 0.78 08/01/2016   BILITOT 0.3 08/01/2016   ALKPHOS 81 08/01/2016   AST 19 08/01/2016   ALT 17 08/01/2016   PROT 6.7 08/01/2016   ALBUMIN 4.2 08/01/2016   CALCIUM 9.3 08/01/2016   ANIONGAP 6 09/07/2015   GFR 106.14 08/01/2016   Lab Results  Component Value Date   CHOL 139 08/01/2016   Lab Results  Component Value Date   HDL 44.40 08/01/2016   Lab Results  Component Value Date   LDLCALC 73 08/05/2015   Lab Results  Component Value Date   TRIG 210.0 (H) 08/01/2016   Lab Results  Component Value Date   CHOLHDL 3 08/01/2016   Lab Results  Component Value Date   HGBA1C 5.9 (H) 04/14/2016         Assessment & Plan:   Problem List Items Addressed This Visit      Unprioritized   Hyperlipidemia   Relevant Orders   Lipid panel (Completed)   Hypothyroidism   Relevant Orders   Comprehensive metabolic panel (Completed)   CBC (Completed)   TSH (Completed)   Chronic combined systolic and diastolic CHF (congestive heart failure) ([place])    Per cardiology      Coronary artery disease involving native heart without angina pectoris    Per cardiology       Other Visit Diagnoses    Edema of right lower extremity    -  Primary   Parkinson disease ([place])       Relevant Medications   pramipexole (MIRAPEX) 0.5 MG tablet      I have discontinued Mr. Comp's sertraline and pantoprazole.  I am also having him maintain his aspirin, clopidogrel, atenolol, levothyroxine, spironolactone, simvastatin, and pramipexole.  Meds ordered this encounter  Medications  . pramipexole (MIRAPEX) 0.5 MG tablet    Sig: Take 1 tablet (0.5 mg total) by mouth 3 (three) times daily.    Dispense:  90 tablet    Refill:  2    CMA served as scribe during this visit. History, Physical and Plan performed by medical provider. Documentation and  orders reviewed and attested to.  Ann Held, DO   Patient ID: Jeremy Walker, male   DOB: [birth year], 66 y.o.   MRN: [id number]

## 2016-08-01 NOTE — Progress Notes (Signed)
Pre visit review using our clinic review tool, if applicable. No additional management support is needed unless otherwise documented below in the visit note. 

## 2016-08-01 NOTE — Assessment & Plan Note (Signed)
Check labs 

## 2016-08-02 ENCOUNTER — Other Ambulatory Visit: Payer: Self-pay | Admitting: Family Medicine

## 2016-08-02 DIAGNOSIS — E785 Hyperlipidemia, unspecified: Secondary | ICD-10-CM

## 2016-08-02 DIAGNOSIS — D691 Qualitative platelet defects: Secondary | ICD-10-CM

## 2016-08-02 MED ORDER — PANTOPRAZOLE SODIUM 40 MG PO TBEC: DELAYED_RELEASE_TABLET | ORAL | 3 refills | Status: DC

## 2016-08-02 MED ORDER — FENOFIBRATE 160 MG PO TABS: 160.0000 mg | ORAL_TABLET | Freq: Every day | ORAL | 3 refills | Status: DC

## 2016-08-02 NOTE — Telephone Encounter (Signed)
Med list corrected. LB

## 2016-08-03 ENCOUNTER — Other Ambulatory Visit: Payer: Self-pay | Admitting: Family Medicine

## 2016-08-03 DIAGNOSIS — I10 Essential (primary) hypertension: Secondary | ICD-10-CM

## 2016-08-03 DIAGNOSIS — K219 Gastro-esophageal reflux disease without esophagitis: Secondary | ICD-10-CM

## 2016-08-03 DIAGNOSIS — G20 Parkinson's disease: Secondary | ICD-10-CM

## 2016-08-03 MED ORDER — PRAMIPEXOLE DIHYDROCHLORIDE 0.5 MG PO TABS: 0.5000 mg | ORAL_TABLET | Freq: Three times a day (TID) | ORAL | 0 refills | Status: DC

## 2016-08-03 MED ORDER — PANTOPRAZOLE SODIUM 40 MG PO TBEC: DELAYED_RELEASE_TABLET | ORAL | 0 refills | Status: DC

## 2016-08-03 MED ORDER — SPIRONOLACTONE 25 MG PO TABS: 12.5000 mg | ORAL_TABLET | Freq: Every day | ORAL | 0 refills | Status: DC

## 2016-08-29 ENCOUNTER — Other Ambulatory Visit: Payer: Self-pay | Admitting: Family Medicine

## 2016-08-29 ENCOUNTER — Other Ambulatory Visit: Payer: Self-pay | Admitting: *Deleted

## 2016-08-29 DIAGNOSIS — E785 Hyperlipidemia, unspecified: Secondary | ICD-10-CM

## 2016-08-29 DIAGNOSIS — I2510 Atherosclerotic heart disease of native coronary artery without angina pectoris: Secondary | ICD-10-CM

## 2016-08-29 MED ORDER — CLOPIDOGREL BISULFATE 75 MG PO TABS: 75.0000 mg | ORAL_TABLET | Freq: Every day | ORAL | 3 refills | Status: AC

## 2016-08-29 MED ORDER — FENOFIBRATE 160 MG PO TABS: 160.0000 mg | ORAL_TABLET | Freq: Every day | ORAL | 3 refills | Status: DC

## 2016-08-30 ENCOUNTER — Other Ambulatory Visit: Payer: Self-pay | Admitting: Internal Medicine

## 2016-08-30 ENCOUNTER — Telehealth: Payer: Self-pay | Admitting: Family Medicine

## 2016-08-30 MED ORDER — ATENOLOL 25 MG PO TABS: 25.0000 mg | ORAL_TABLET | Freq: Every day | ORAL | 1 refills | Status: AC

## 2016-08-30 NOTE — Telephone Encounter (Signed)
Relation to GS:UPJS Call back number:[phone]   Reason for call:   Patient last seen 08/01/16 due to swollen legs, patient states water pills are not working. Patient states he has to stay active due to patient living alone and taking care of he's dog in need of clinical advice   Patient declined appointment states PCP would like patient to return in 55 month.

## 2016-08-30 NOTE — Telephone Encounter (Signed)
Would you like to increase the spironlactone, try something else, or come in.

## 2016-09-08 ENCOUNTER — Encounter: Payer: Self-pay | Admitting: Family Medicine

## 2016-09-08 ENCOUNTER — Ambulatory Visit (INDEPENDENT_AMBULATORY_CARE_PROVIDER_SITE_OTHER): Payer: Medicare HMO | Admitting: Family Medicine

## 2016-09-08 VITALS — BP 134/73 | HR 56 | Temp 98.7°F | Resp 18 | Ht 70.47 in | Wt 176.0 lb

## 2016-09-08 DIAGNOSIS — R600 Localized edema: Secondary | ICD-10-CM

## 2016-09-08 DIAGNOSIS — I5042 Chronic combined systolic (congestive) and diastolic (congestive) heart failure: Secondary | ICD-10-CM | POA: Diagnosis not present

## 2016-09-08 DIAGNOSIS — E782 Mixed hyperlipidemia: Secondary | ICD-10-CM

## 2016-09-08 DIAGNOSIS — I10 Essential (primary) hypertension: Secondary | ICD-10-CM

## 2016-09-08 NOTE — Addendum Note (Signed)
Addended by: Delia Chimes A on: 09/08/2016 09:08 AM   Modules accepted: Level of Service

## 2016-09-08 NOTE — Patient Instructions (Addendum)
     IF you received an x-ray today, you will receive an invoice from [facility]. Please contact [facility] at [phone] with questions or concerns regarding your invoice.   IF you received labwork today, you will receive an invoice from [facility]. Please contact [facility] at [phone] with questions or concerns regarding your invoice.   Our billing staff will not be able to assist you with questions regarding bills from these companies.  You will be contacted with the lab results as soon as they are available. The fastest way to get your results is to activate your My Chart account. Instructions are located on the last page of this paperwork. If you have not heard from us regarding the results in 2 weeks, please contact this office.      Peripheral Edema Peripheral edema is swelling that is caused by a buildup of fluid. Peripheral edema most often affects the lower legs, ankles, and feet. It can also develop in the arms, hands, and face. The area of the body that has peripheral edema will look swollen. It may also feel heavy or warm. Your clothes may start to feel tight. Pressing on the area may make a temporary dent in your skin. You may not be able to move your arm or leg as much as usual. There are many causes of peripheral edema. It can be a complication of other diseases, such as congestive heart failure, kidney disease, or a problem with your blood circulation. It also can be a side effect of certain medicines. It often happens to women during pregnancy. Sometimes, the cause is not known. Treating the underlying condition is often the only treatment for peripheral edema. Follow these instructions at home: Pay attention to any changes in your symptoms. Take these actions to help with your discomfort:  Raise (elevate) your legs while you are sitting or lying down.  Move around often to prevent stiffness and to lessen swelling. Do not sit or stand for long periods of  time.  Wear support stockings as told by your health care provider.  Follow instructions from your health care provider about limiting salt (sodium) in your diet. Sometimes eating less salt can reduce swelling.  Take over-the-counter and prescription medicines only as told by your health care provider. Your health care provider may prescribe medicine to help your body get rid of excess water (diuretic).  Keep all follow-up visits as told by your health care provider. This is important.  Contact a health care provider if:  You have a fever.  Your edema starts suddenly or is getting worse, especially if you are pregnant or have a medical condition.  You have swelling in only one leg.  You have increased swelling and pain in your legs. Get help right away if:  You develop shortness of breath, especially when you are lying down.  You have pain in your chest or abdomen.  You feel weak.  You faint. This information is not intended to replace advice given to you by your health care provider. Make sure you discuss any questions you have with your health care provider. Document Released: 05/04/2004 Document Revised: 08/30/2015 Document Reviewed: 10/07/2014 Elsevier Interactive Patient Education  2018 [employer]   

## 2016-09-08 NOTE — Progress Notes (Signed)
Chief Complaint  Patient presents with  . Foot Swelling    per patient, L foot swells each day    HPI  Pt reports that he has been having swelling of hte left foot  He was diagnosed with Parkinson's He also has family history of diabetes. His last a1c is 5.9% The swelling is only at the ankle He avoids salty foods He has given up all cholesterols.  He has right ankle swelling as well and there is no pain  He has not been wearing compression stockings He states that he only used the stockings for 2 weeks  He has a history of chronic combined systolic and diastolic CHF  His last EF was 55%    Past Medical History:  Diagnosis Date  . AICD (automatic cardioverter/defibrillator) present    Abdominal implant was 0064 Guidant lead 6/5 mm  . Anxiety   . Atrial fibrillation ([place])   . CAD (coronary artery disease) 03/2015   a. DESx2 to mid and distal RCA  . Childhood asthma   . Chronic combined systolic and diastolic CHF (congestive heart failure) ([place])    a. ischemic CM - EF 40-45 in 12/16 // b. Echo 12/17: Echo 12/17: EF 50-55, normal wall motion, grade 1 diastolic dysfunction  . Depression   . GERD (gastroesophageal reflux disease)   . History of hiatal hernia   . HLD (hyperlipidemia)   . Hypokalemia    Recurrent associated with cardiac arrest  . Hypothyroidism   . Leg pain    ABIs 12/17: normal   . Sudden cardiac death ([place]) 1994   aborted  . Thyroid disease   . Tobacco abuse    02-16-14 past hx. heavy smoker, quit < 68yr.    Current Outpatient Prescriptions  Medication Sig Dispense Refill  . aspirin 81 MG EC tablet TAKE 1 TABLET(81 MG) BY MOUTH DAILY 90 tablet 3  . atenolol (TENORMIN) 25 MG tablet Take 1 tablet (25 mg total) by mouth daily. 90 tablet 1  . clopidogrel (PLAVIX) 75 MG tablet Take 1 tablet (75 mg total) by mouth daily. 90 tablet 3  . fenofibrate 160 MG tablet Take 1 tablet (160 mg total) by mouth daily. 90 tablet 3  . levothyroxine (SYNTHROID,  LEVOTHROID) 50 MCG tablet TAKE 1 TABLET EVERY DAY 90 tablet 0  . pantoprazole (PROTONIX) 40 MG tablet Take 1 tablet (by mouth) twice daily. 180 tablet 0  . pramipexole (MIRAPEX) 0.5 MG tablet Take 1 tablet (0.5 mg total) by mouth 3 (three) times daily. 270 tablet 0  . simvastatin (ZOCOR) 40 MG tablet TAKE 1 TABLET (40 MG TOTAL) BY MOUTH DAILY AT 6 PM. 90 tablet 0  . spironolactone (ALDACTONE) 25 MG tablet Take 0.5 tablets (12.5 mg total) by mouth daily. 135 tablet 0   No current facility-administered medications for this visit.     Allergies: No Known Allergies  Past Surgical History:  Procedure Laterality Date  . CARDIAC CATHETERIZATION N/A 04/08/2015   Procedure: Left Heart Cath and Coronary Angiography;  Surgeon: Burnell Blanks, MD;  Location: [facility];  Service: Cardiovascular;  Laterality: N/A;  . CARDIAC CATHETERIZATION N/A 04/08/2015   Procedure: Coronary Stent Intervention;  Surgeon: Burnell Blanks, MD;  Location: [facility];  Service: Cardiovascular;  Laterality: N/A;  . CARDIAC DEFIBRILLATOR PLACEMENT  1991   guidant  . EUS N/A 02/26/2014   Procedure: ESOPHAGEAL ENDOSCOPIC ULTRASOUND (EUS) RADIAL;  Surgeon: Milus Banister, MD;  Location: [facility];  Service:  Endoscopy;  Laterality: N/A;  radial linear  . HAND SURGERY Left 1965   "got it caught in a conveyor belt"    Social History   Social History  . Marital status: Married    Spouse name: N/A  . Number of children: 1  . Years of education: N/A   Occupational History  . retired     Games developer   Social History Main Topics  . Smoking status: Former Smoker    Packs/day: 1.50    Years: 42.00    Types: Cigarettes    Quit date: 02/04/2013  . Smokeless tobacco: Never Used  . Alcohol use No     Comment: "stopped drinking in 01/2013"- prior heavy with beer  . Drug use: No     Comment: "none since 1971"  . Sexual activity: Not Currently   Other Topics Concern  . None    Social History Narrative  . None    ROS  Objective: Vitals:   09/08/16 0834  BP: 134/73  Pulse: (!) 56  Resp: 18  Temp: 98.7 F (37.1 C)  TempSrc: Oral  SpO2: 97%  Weight: 176 lb (79.8 kg)  Height: 5' 10.47" (1.79 m)    Physical Exam  Constitutional: He is oriented to person, place, and time. He appears well-developed and well-nourished.  HENT:  Head: Normocephalic and atraumatic.  Eyes: Conjunctivae and EOM are normal.  Cardiovascular: Normal rate, regular rhythm and normal heart sounds.   Pulmonary/Chest: Effort normal and breath sounds normal. No respiratory distress. He has no wheezes.  Abdominal: Soft. Bowel sounds are normal. He exhibits no distension.  Defibrillator palpated, also large hernia noted  Musculoskeletal:  Right ankle with 1+ edema, left foot and ankle  normal  Neurological: He is alert and oriented to person, place, and time.  Psychiatric: He has a normal mood and affect. His behavior is normal. Judgment and thought content normal.    Assessment and Plan Mahin was seen today for foot swelling.  Diagnoses and all orders for this visit:  Bilateral lower extremity edema- pt not compliant with compression stockings which he has at home Reviewed his previous cmp and his results are normal  Essential hypertension-  bp well controlled  Chronic combined systolic and diastolic CHF (congestive heart failure) ([place])- reviewed his last Echo and reassured him that with his ejection fraction he is not currently deteriorating His cardiopulm exam was normal  Mixed hyperlipidemia- discussed his cholesterol results with him at length Advised him not to be so strict but to enjoy his meals but he should limit fatty meats and high fat foods Discussed how simvastatin   A total of 25 minutes were spent face-to-face with the patient during this encounter and over half of that time was spent on counseling and coordination of care.  [place]

## 2016-09-19 ENCOUNTER — Other Ambulatory Visit: Payer: Self-pay

## 2016-09-19 NOTE — Telephone Encounter (Signed)
clopidogrel (PLAVIX) 75 MG tablet  Medication  Date: 08/29/2016 Department: [facility] [address] Office Ordering/Authorizing: [facility] [place]  Order Providers   Prescribing Provider Encounter Provider  Liliane Shi, PA-C Shoffner, Terin C  Medication Detail    Disp Refills Start End   clopidogrel (PLAVIX) 75 MG tablet 90 tablet 3 08/29/2016    Sig - Route: Take 1 tablet (75 mg total) by mouth daily. - Oral   E-Prescribing Status: Receipt confirmed by pharmacy (08/29/2016 11:11 AM EDT)   Pharmacy   [facility] [address]      Additional Information   Associated Reports  View Encounter  Priority and Order Details

## 2016-09-25 ENCOUNTER — Encounter: Payer: BLUE CROSS/BLUE SHIELD | Admitting: Physician Assistant

## 2016-10-06 ENCOUNTER — Encounter: Payer: Self-pay | Admitting: Physical Therapy

## 2016-10-06 NOTE — Therapy (Signed)
[facility] [address] [place], [place], [address] Phone: [phone]   Fax:  [phone]  Patient Details  Name: Merlen Gurry MRN: [id number] Date of Birth: January 09, 1951 Referring Provider:  No ref. provider found  Encounter Date: 10/06/2016  PHYSICAL THERAPY DISCHARGE SUMMARY  Visits from Start of Care: 10  Current functional level related to goals / functional outcomes: Pt had partially met LTGs.  Goals not fully able to be assessed, due to pt not returning since 06/15/16 PT visit.   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   Plan: Patient agrees to discharge.  Patient goals were partially met. Patient is being discharged due to not returning since the last visit.  ?????       Frazier Butt., PT  Mady Haagensen W. 10/06/2016, 12:25 PM  [facility] [address] [place], [place], [address] Phone: [phone]   Fax:  [phone]

## 2016-10-08 NOTE — Progress Notes (Signed)
Cardiology Office Note Date:  10/09/2016  Patient ID:  Jeremy Walker, DOB 07/03/1950, MRN [id number] PCP:  Ann Held, DO  Cardiologist:  Dr. Angelena Form Electrophysiologist: Dr. Caryl Comes    Chief Complaint: routine device visit  History of Present Illness: Jeremy Walker is a 66 y.o. male with history of CAD, remote cardiac arrest w/ICD (initially an abdominal implant) record indicates cardiac arrest was in the environment of severe hypokalemia of unknown cause.  PMHx also includes recent diagnosis of Parkinson's, as well as DM, chronic CHF (systolic), depression, LD, hypothyroidism.   Last ischemic evaluation was myoview scanning >>> demonstrated inferior and inferolateral ischemia, normal LV function. Cardiac catheterization was arranged. This was performed 04/08/15 and demonstrated mid RCA occlusion which was treated with Promus DES 2. There was concern for possible thrombus in the LV apex on left ventriculogram. Echocardiogram was obtained and demonstrated EF 40-45%. There is no evidence of thrombus.  He comes in to day to be seen for Dr. Caryl Comes, last seen by him Dec 2017, at that time given hx of severe hypokalemia his ACE was stopped, aldactone started with plans for f/u labs.  Otherwise no reported symptoms.  He was under personal stressors at the time.  He tells me that he was talking to his sister-in-law who called him and reported that she had been found with a blood count of 5 and was having symptoms that sound like his.  He reports when he is working in the yard (and only with this activity) he feels a very diffuse chest discomfort, non-radiating, no associated SOB, palpitations, no diaphoresis, N/V, it resolves about 15 minutes after rest.  It does not occur with other activities or at rest.  When asked what he thought the symptom may be from, or if it was similar to his heart symptoms prior to his last cath/PCI, he says it feels nothing like his heart symptom, and that it feels  like he may have cancer.  He is unable to tell me how long he has been getting this for, the best he can narrow down to is "a while" maybe a couple months or so.  He is very concerned/upset about his fairly recent diagnosis of Parkinson's and remarks there was a day when he was a strong young man.  He is concerned about LLE swelling that is intermittent, and feels like no one is paying attention to this.  It has been going on about 3 months, is usually resolved by AM, accumulates through the day and since his PMD suggested a support stocking has been better controlled, this is a source of concern for him, says beyond recommending the stocking, nothing else has been done to investigate it  Device information: BSci single chamber ICD, 1994, gen change 2012 (had gen change in between)   Past Medical History:  Diagnosis Date  . AICD (automatic cardioverter/defibrillator) present    Abdominal implant was 0064 Guidant lead 6/5 mm  . Anxiety   . Atrial fibrillation ([place])   . CAD (coronary artery disease) 03/2015   a. DESx2 to mid and distal RCA  . Childhood asthma   . Chronic combined systolic and diastolic CHF (congestive heart failure) ([place])    a. ischemic CM - EF 40-45 in 12/16 // b. Echo 12/17: Echo 12/17: EF 50-55, normal wall motion, grade 1 diastolic dysfunction  . Depression   . GERD (gastroesophageal reflux disease)   . History of hiatal hernia   . HLD (hyperlipidemia)   . Hypokalemia  Recurrent associated with cardiac arrest  . Hypothyroidism   . Leg pain    ABIs 12/17: normal   . Sudden cardiac death ([place]) 1994   aborted  . Thyroid disease   . Tobacco abuse    02-16-14 past hx. heavy smoker, quit < 55yr.    Past Surgical History:  Procedure Laterality Date  . CARDIAC CATHETERIZATION N/A 04/08/2015   Procedure: Left Heart Cath and Coronary Angiography;  Surgeon: Burnell Blanks, MD;  Location: [facility];  Service: Cardiovascular;  Laterality: N/A;  . CARDIAC  CATHETERIZATION N/A 04/08/2015   Procedure: Coronary Stent Intervention;  Surgeon: Burnell Blanks, MD;  Location: [facility];  Service: Cardiovascular;  Laterality: N/A;  . CARDIAC DEFIBRILLATOR PLACEMENT  1991   guidant  . EUS N/A 02/26/2014   Procedure: ESOPHAGEAL ENDOSCOPIC ULTRASOUND (EUS) RADIAL;  Surgeon: Milus Banister, MD;  [facility];  Service: Endoscopy;  Laterality: N/A;  radial linear  . HAND SURGERY Left 1965   "got it caught in a conveyor belt"    Current Outpatient Prescriptions  Medication Sig Dispense Refill  . aspirin 81 MG EC tablet TAKE 1 TABLET(81 MG) BY MOUTH DAILY 90 tablet 3  . atenolol (TENORMIN) 25 MG tablet Take 1 tablet (25 mg total) by mouth daily. 90 tablet 1  . clopidogrel (PLAVIX) 75 MG tablet Take 1 tablet (75 mg total) by mouth daily. 90 tablet 3  . fenofibrate 160 MG tablet Take 1 tablet (160 mg total) by mouth daily. 90 tablet 3  . levothyroxine (SYNTHROID, LEVOTHROID) 50 MCG tablet TAKE 1 TABLET EVERY DAY 90 tablet 0  . pantoprazole (PROTONIX) 40 MG tablet Take 1 tablet (by mouth) twice daily. 180 tablet 0  . pramipexole (MIRAPEX) 0.5 MG tablet Take 1 tablet (0.5 mg total) by mouth 3 (three) times daily. 270 tablet 0  . simvastatin (ZOCOR) 40 MG tablet TAKE 1 TABLET (40 MG TOTAL) BY MOUTH DAILY AT 6 PM. 90 tablet 0  . spironolactone (ALDACTONE) 25 MG tablet Take 0.5 tablets (12.5 mg total) by mouth daily. 135 tablet 0   No current facility-administered medications for this visit.     Allergies:   Patient has no known allergies.   Social History:  The patient  reports that he quit smoking about 3 years ago. His smoking use included Cigarettes. He has a 63.00 pack-year smoking history. He has never used smokeless tobacco. He reports that he does not drink alcohol or use drugs.   Family History:  The patient's family history includes Cancer in his father; Diabetes in his brother, maternal aunt, and mother; Heart failure in  his mother; Lung cancer in his brother; Ovarian cancer in his mother; Parkinson's disease in his mother; Scoliosis in his mother.  ROS:  Please see the history of present illness.    All other systems are reviewed and otherwise negative.   PHYSICAL EXAM:  VS:  BP 118/76   Pulse (!) 56   Ht 5' 10.75" (1.797 m)   Wt 171 lb (77.6 kg)   BMI 24.02 kg/m  BMI: Body mass index is 24.02 kg/m. Well nourished, well developed, in no acute distress  HEENT: normocephalic, atraumatic  Neck: no JVD, carotid bruits or masses Cardiac:  RRR; no significant murmurs, no rubs, or gallops Lungs:  CTA b/l, no wheezing, rhonchi or rales  Abd: soft, nontender MS: no deformity or atrophy Ext: trace - 1+ edema LLE, no skin changes, palpable masses or calf tenderness Skin:  warm and dry, no rash Neuro:  No gross deficits appreciated Psych: euthymic mood, full affect  ICD site (abdomen) is stable, no tethering or discomfort   EKG:  SB, appears unchanged, no ischemic looking findings ICD interrogation done today by industry and reviewed by myself: In discussion with industry rep, device R waves have measured 3-97mV and today's 4.6 is within his historical measurements, battery/lead measurments stable, 3 high v rates, single chamber device, 4-10beats  03/28/16; TTE Study Conclusions - Left ventricle: The cavity size was normal. Systolic function was   normal. The estimated ejection fraction was in the range of 50%   to 55%. Wall motion was normal; there were no regional wall   motion abnormalities. Doppler parameters are consistent with   abnormal left ventricular relaxation (grade 1 diastolic   dysfunction). Doppler parameters are consistent with   indeterminate ventricular filling pressure. - Aortic valve: Transvalvular velocity was within the normal range.   There was no stenosis. There was no regurgitation. - Mitral valve: Transvalvular velocity was within the normal range.   There was no evidence for  stenosis. There was trivial   regurgitation. - Right ventricle: The cavity size was normal. Wall thickness was   normal. Systolic function was normal. - Right atrium: The atrium was normal in size. - Atrial septum: No defect or patent foramen ovale was identified   by color flow Doppler. - Tricuspid valve: There was no regurgitation.   Recent Labs: 03/07/2016: Magnesium 2.1 08/01/2016: ALT 17; BUN 19; Creatinine, Ser 0.78; Hemoglobin 13.9; Platelets 128.0; Potassium 4.4; Sodium 140; TSH 4.20  08/01/2016: Cholesterol 139; Direct LDL 75.0; HDL 44.40; Total CHOL/HDL Ratio 3; Triglycerides 210.0; VLDL 42.0   CrCl cannot be calculated (Patient's most recent lab result is older than the maximum 21 days allowed.).   Wt Readings from Last 3 Encounters:  10/09/16 171 lb (77.6 kg)  09/08/16 176 lb (79.8 kg)  08/01/16 191 lb 6.4 oz (86.8 kg)     Other studies reviewed: Additional studies/records reviewed today include: summarized above  ASSESSMENT AND PLAN:  1. Hx of cardiac arrest, ICM     Last echo with improved EF     Exam/weight does not suggest fluid OL  2. ICD     Stable device function     He does not do remotes, comes in Q 38mo for device checks/visits  3. CAD     On ASA, plavix, BB, statin      Symptom as described onset is vague, it is not like his prior anginal symptom, he says his heart did not even come to mind, is exertional and resolves with rest     No EKG changes     Will send him for lexiscan stress testing, he is given Rx for s/l NTG and instructed on when/how to use     He declines the s/l NTG at first though ultimately is agreeable if by rest his symptom does not resolve     It sounds like he does very laborious yard work and is instructed to avoid exertional activities and the level of exertion that is provoking his symptom     If he has any escalating symptoms he is instructed to call 911/seek medical attention immediately  4. HTN     Looks OK  5. HLD      Trigs were high his last lipid profile      He reports his PMD is managing and had discussed his diet with him, he is  making adjustments  6. There is mention of AFib on his problem list     I do not know when this occurred or in what context     His device is a single lead device, though no findings that would suggest AF episodes     follow   Disposition: He had a couple very brief NSVT, will get BMET, mag level, he requests CBC very worried about being anemic (though no reported bleeding or signs of bleeding), send him for LLE venous US, stress testing and will see him back in 6 weeks, sooner if needed.    Current medicines are reviewed at length with the patient today.  The patient did not have any concerns regarding medicines.  Haywood Lasso, PA-C 10/09/2016 2:29 PM     [address] [place] [address] [phone] (office)  [phone] (fax)

## 2016-10-09 ENCOUNTER — Ambulatory Visit (INDEPENDENT_AMBULATORY_CARE_PROVIDER_SITE_OTHER): Payer: Medicare HMO | Admitting: Physician Assistant

## 2016-10-09 VITALS — BP 118/76 | HR 56 | Ht 70.75 in | Wt 171.0 lb

## 2016-10-09 DIAGNOSIS — I82403 Acute embolism and thrombosis of unspecified deep veins of lower extremity, bilateral: Secondary | ICD-10-CM | POA: Diagnosis not present

## 2016-10-09 DIAGNOSIS — I5042 Chronic combined systolic (congestive) and diastolic (congestive) heart failure: Secondary | ICD-10-CM

## 2016-10-09 DIAGNOSIS — I255 Ischemic cardiomyopathy: Secondary | ICD-10-CM

## 2016-10-09 DIAGNOSIS — Z95810 Presence of automatic (implantable) cardiac defibrillator: Secondary | ICD-10-CM | POA: Diagnosis not present

## 2016-10-09 DIAGNOSIS — I25118 Atherosclerotic heart disease of native coronary artery with other forms of angina pectoris: Secondary | ICD-10-CM

## 2016-10-09 DIAGNOSIS — E782 Mixed hyperlipidemia: Secondary | ICD-10-CM | POA: Diagnosis not present

## 2016-10-09 DIAGNOSIS — R079 Chest pain, unspecified: Secondary | ICD-10-CM

## 2016-10-09 DIAGNOSIS — E876 Hypokalemia: Secondary | ICD-10-CM | POA: Diagnosis not present

## 2016-10-09 MED ORDER — NITROGLYCERIN 0.4 MG SL SUBL: 0.4000 mg | SUBLINGUAL_TABLET | SUBLINGUAL | 3 refills | Status: DC | PRN

## 2016-10-09 NOTE — Patient Instructions (Addendum)
Medication Instructions:   START TAKING NITROGLYCERIN 0.4 MG SUBLINGUAL TABLET FOR CHEST PAIN  If you need a refill on your cardiac medications before your next appointment, please call your pharmacy.  Labwork: BMET MAG AND CBC    Testing/Procedures: Your physician has requested that you have a lower extremity arterial exercise duplex. During this test, exercise and ultrasound are used to evaluate arterial blood flow in the legs. Allow one hour for this exam. There are no restrictions or special instructions.    Follow-Up: 6 WEEKS WITH [facility]  ( RECALL IF APPOINTMENTS  NOT AVAILABLE YET)   Any Other Special Instructions Will Be Listed Below (If Applicable).   Nitroglycerin sublingual tablets What is this medicine? NITROGLYCERIN (nye troe GLI ser in) is a type of vasodilator. It relaxes blood vessels, increasing the blood and oxygen supply to your heart. This medicine is used to relieve chest pain caused by angina. It is also used to prevent chest pain before activities like climbing stairs, going outdoors in cold weather, or sexual activity. This medicine may be used for other purposes; ask your health care provider or pharmacist if you have questions. COMMON BRAND NAME(S): Nitroquick, Nitrostat, Nitrotab What should I tell my health care provider before I take this medicine? They need to know if you have any of these conditions: -anemia -head injury, recent stroke, or bleeding in the brain -liver disease -previous heart attack -an unusual or allergic reaction to nitroglycerin, other medicines, foods, dyes, or preservatives -pregnant or trying to get pregnant -breast-feeding How should I use this medicine? Take this medicine by mouth as needed. At the first sign of an angina attack (chest pain or tightness) place one tablet under your tongue. You can also take this medicine 5 to 10 minutes before an event likely to produce chest pain. Follow the directions on the prescription label.  Let the tablet dissolve under the tongue. Do not swallow whole. Replace the dose if you accidentally swallow it. It will help if your mouth is not dry. Saliva around the tablet will help it to dissolve more quickly. Do not eat or drink, smoke or chew tobacco while a tablet is dissolving. If you are not better within 5 minutes after taking ONE dose of nitroglycerin, call 9-1-1 immediately to seek emergency medical care. Do not take more than 3 nitroglycerin tablets over 15 minutes. If you take this medicine often to relieve symptoms of angina, your doctor or health care professional may provide you with different instructions to manage your symptoms. If symptoms do not go away after following these instructions, it is important to call 9-1-1 immediately. Do not take more than 3 nitroglycerin tablets over 15 minutes. Talk to your pediatrician regarding the use of this medicine in children. Special care may be needed. Overdosage: If you think you have taken too much of this medicine contact a poison control center or emergency room at once. NOTE: This medicine is only for you. Do not share this medicine with others. What if I miss a dose? This does not apply. This medicine is only used as needed. What may interact with this medicine? Do not take this medicine with any of the following medications: -certain migraine medicines like ergotamine and dihydroergotamine (DHE) -medicines used to treat erectile dysfunction like sildenafil, tadalafil, and vardenafil -riociguat This medicine may also interact with the following medications: -alteplase -aspirin -heparin -medicines for high blood pressure -medicines for mental depression -other medicines used to treat angina -phenothiazines like chlorpromazine, mesoridazine, prochlorperazine, thioridazine  This list may not describe all possible interactions. Give your health care provider a list of all the medicines, herbs, non-prescription drugs, or dietary  supplements you use. Also tell them if you smoke, drink alcohol, or use illegal drugs. Some items may interact with your medicine. What should I watch for while using this medicine? Tell your doctor or health care professional if you feel your medicine is no longer working. Keep this medicine with you at all times. Sit or lie down when you take your medicine to prevent falling if you feel dizzy or faint after using it. Try to remain calm. This will help you to feel better faster. If you feel dizzy, take several deep breaths and lie down with your feet propped up, or bend forward with your head resting between your knees. You may get drowsy or dizzy. Do not drive, use machinery, or do anything that needs mental alertness until you know how this drug affects you. Do not stand or sit up quickly, especially if you are an older patient. This reduces the risk of dizzy or fainting spells. Alcohol can make you more drowsy and dizzy. Avoid alcoholic drinks. Do not treat yourself for coughs, colds, or pain while you are taking this medicine without asking your doctor or health care professional for advice. Some ingredients may increase your blood pressure. What side effects may I notice from receiving this medicine? Side effects that you should report to your doctor or health care professional as soon as possible: -blurred vision -dry mouth -skin rash -sweating -the feeling of extreme pressure in the head -unusually weak or tired Side effects that usually do not require medical attention (report to your doctor or health care professional if they continue or are bothersome): -flushing of the face or neck -headache -irregular heartbeat, palpitations -nausea, vomiting This list may not describe all possible side effects. Call your doctor for medical advice about side effects. You may report side effects to FDA at 1-800-FDA-1088. Where should I keep my medicine? Keep out of the reach of children. Store at  room temperature between 20 and 25 degrees C (68 and 77 degrees F). Store in Chief of Staff. Protect from light and moisture. Keep tightly closed. Throw away any unused medicine after the expiration date. NOTE: This sheet is a summary. It may not cover all possible information. If you have questions about this medicine, talk to your doctor, pharmacist, or health care provider.  2018 Elsevier/Gold Standard (2013-01-23 17:57:36)

## 2016-10-10 LAB — CBC
HEMOGLOBIN: 13.4 g/dL (ref 13.0–17.7)
Hematocrit: 40.2 % (ref 37.5–51.0)
MCH: 31.6 pg (ref 26.6–33.0)
MCHC: 33.3 g/dL (ref 31.5–35.7)
MCV: 95 fL (ref 79–97)
Platelets: 153 10*3/uL (ref 150–379)
RBC: 4.24 x10E6/uL (ref 4.14–5.80)
RDW: 13.7 % (ref 12.3–15.4)
WBC: 5.3 10*3/uL (ref 3.4–10.8)

## 2016-10-10 LAB — MAGNESIUM: MAGNESIUM: 2.1 mg/dL (ref 1.6–2.3)

## 2016-10-17 ENCOUNTER — Telehealth: Payer: Self-pay | Admitting: Physician Assistant

## 2016-10-17 NOTE — Telephone Encounter (Signed)
New message ° ° ° °Pt is returning call to nurse about test results. °

## 2016-10-17 NOTE — Telephone Encounter (Signed)
LM TO CALL BACK

## 2016-10-20 ENCOUNTER — Telehealth (HOSPITAL_COMMUNITY): Payer: Self-pay

## 2016-10-20 NOTE — Telephone Encounter (Signed)
Encounter complete. 

## 2016-10-25 ENCOUNTER — Ambulatory Visit (HOSPITAL_COMMUNITY)
Admission: RE | Admit: 2016-10-25 | Discharge: 2016-10-25 | Disposition: A | Discharge status: Home / Self Care | Payer: Medicare HMO | Source: Ambulatory Visit | Attending: Cardiology | Admitting: Cardiology

## 2016-10-25 DIAGNOSIS — I82403 Acute embolism and thrombosis of unspecified deep veins of lower extremity, bilateral: Secondary | ICD-10-CM

## 2016-10-25 DIAGNOSIS — R079 Chest pain, unspecified: Secondary | ICD-10-CM

## 2016-10-26 ENCOUNTER — Ambulatory Visit (HOSPITAL_COMMUNITY)
Admission: RE | Admit: 2016-10-26 | Discharge: 2016-10-26 | Disposition: A | Discharge status: Home / Self Care | Payer: Medicare HMO | Source: Ambulatory Visit | Attending: Cardiology | Admitting: Cardiology

## 2016-10-26 DIAGNOSIS — R079 Chest pain, unspecified: Secondary | ICD-10-CM | POA: Diagnosis not present

## 2016-10-26 DIAGNOSIS — R0602 Shortness of breath: Secondary | ICD-10-CM | POA: Diagnosis not present

## 2016-10-26 DIAGNOSIS — M7989 Other specified soft tissue disorders: Secondary | ICD-10-CM | POA: Diagnosis not present

## 2016-10-26 DIAGNOSIS — I82403 Acute embolism and thrombosis of unspecified deep veins of lower extremity, bilateral: Secondary | ICD-10-CM | POA: Diagnosis not present
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
## 2016-10-30 ENCOUNTER — Emergency Department (HOSPITAL_COMMUNITY)
Admission: EM | Admit: 2016-10-30 | Discharge: 2016-10-30 | Disposition: A | Discharge status: Home / Self Care | Payer: Medicare HMO | Attending: Emergency Medicine | Admitting: Emergency Medicine

## 2016-10-30 ENCOUNTER — Encounter: Payer: Self-pay | Admitting: Neurology

## 2016-10-30 ENCOUNTER — Encounter (HOSPITAL_COMMUNITY): Payer: Self-pay | Admitting: Emergency Medicine

## 2016-10-30 ENCOUNTER — Telehealth: Payer: Self-pay | Admitting: Internal Medicine

## 2016-10-30 ENCOUNTER — Ambulatory Visit (INDEPENDENT_AMBULATORY_CARE_PROVIDER_SITE_OTHER): Payer: Medicare HMO | Admitting: Neurology

## 2016-10-30 VITALS — BP 98/60 | HR 56 | Ht 70.5 in | Wt 168.0 lb

## 2016-10-30 DIAGNOSIS — R079 Chest pain, unspecified: Secondary | ICD-10-CM

## 2016-10-30 DIAGNOSIS — Z5321 Procedure and treatment not carried out due to patient leaving prior to being seen by health care provider: Secondary | ICD-10-CM | POA: Diagnosis not present

## 2016-10-30 DIAGNOSIS — F4321 Adjustment disorder with depressed mood: Secondary | ICD-10-CM

## 2016-10-30 DIAGNOSIS — R2242 Localized swelling, mass and lump, left lower limb: Secondary | ICD-10-CM | POA: Insufficient documentation

## 2016-10-30 DIAGNOSIS — R634 Abnormal weight loss: Secondary | ICD-10-CM

## 2016-10-30 DIAGNOSIS — G20 Parkinson's disease: Secondary | ICD-10-CM

## 2016-10-30 LAB — CBC
HCT: 37 % — ABNORMAL LOW (ref 39.0–52.0)
Hemoglobin: 12.2 g/dL — ABNORMAL LOW (ref 13.0–17.0)
MCH: 31.3 pg (ref 26.0–34.0)
MCHC: 33 g/dL (ref 30.0–36.0)
MCV: 94.9 fL (ref 78.0–100.0)
Platelets: 155 10*3/uL (ref 150–400)
RBC: 3.9 MIL/uL — AB (ref 4.22–5.81)
RDW: 13.4 % (ref 11.5–15.5)
WBC: 4.1 10*3/uL (ref 4.0–10.5)

## 2016-10-30 LAB — BASIC METABOLIC PANEL
Anion gap: 7 (ref 5–15)
BUN: 15 mg/dL (ref 6–20)
CALCIUM: 9.2 mg/dL (ref 8.9–10.3)
CO2: 28 mmol/L (ref 22–32)
CREATININE: 1.05 mg/dL (ref 0.61–1.24)
Chloride: 107 mmol/L (ref 101–111)
GFR calc non Af Amer: >60 mL/min (ref >60)
Glucose, Bld: 101 mg/dL — ABNORMAL HIGH (ref 65–99)
Potassium: 3.9 mmol/L (ref 3.5–5.1)
SODIUM: 142 mmol/L (ref 135–145)

## 2016-10-30 NOTE — ED Notes (Signed)
Pt up to the desk stating he feels better and is going to leave, pt made aware that he is the next person to go back to a room and that he should not have to wait much longer, pt stated "I have a roast in the crockpot and I am feeling better now but thank you," pt advised to please come back to ED if he begins to feel bad again, pt stated he would. Ambulated out of ED with NAD.

## 2016-10-30 NOTE — ED Triage Notes (Signed)
Pt. Stated, I've had chest pain going on about a week. I took a Nitro and it eased it off and then came back.  I also have left leg leg swollen for 2 weeks.

## 2016-10-30 NOTE — Telephone Encounter (Signed)
Pt c/o of Chest Pain: 1. Are you having CP right now? yes 2. Are you experiencing any other symptoms (ex. SOB, nausea, vomiting, sweating)? nho 3. How long have you been experiencing CP? About a week 4. Is your CP continuous or coming and going? continuous 5. Have you taken Nitroglycerin? 1-2 day for about a week  Call came in from [facility], pt at their office, stating chest pain continuous for about a week, taking 1-2 Nitro a day with some pain relief but not subsiding. Talk with Valetta Fuller in Triage, she states if Nitro not relieving the pain he needs to go to ER, info given to [facility] who will relay msg to pt.

## 2016-10-31 NOTE — Telephone Encounter (Signed)
Gabriel Rung, RN  Emergency Medicine    [] Hide copied text [] Hover for attribution information Pt up to the desk stating he feels better and is going to leave, pt made aware that he is the next person to go back to a room and that he should not have to wait much longer, pt stated "I have a roast in the crockpot and I am feeling better now but thank you," pt advised to please come back to ED if he begins to feel bad again, pt stated he would. Ambulated out of ED with NAD.     Electronically signed by

## 2016-11-02 ENCOUNTER — Telehealth (HOSPITAL_COMMUNITY): Payer: Self-pay

## 2016-11-02 LAB — BASIC METABOLIC PANEL
BUN/Creatinine Ratio: 18 (ref 10–24)
BUN: 22 mg/dL (ref 8–27)
CALCIUM: 9.7 mg/dL (ref 8.6–10.2)
CO2: 17 mmol/L — ABNORMAL LOW (ref 20–29)
CREATININE: 1.25 mg/dL (ref 0.76–1.27)
Chloride: 106 mmol/L (ref 96–106)
GFR, EST AFRICAN AMERICAN: 69 mL/min/{1.73_m2} (ref >59)
GFR, EST NON AFRICAN AMERICAN: 60 mL/min/{1.73_m2} (ref >59)
Glucose: 94 mg/dL (ref 65–99)
POTASSIUM: 4.3 mmol/L (ref 3.5–5.2)
Sodium: 146 mmol/L — ABNORMAL HIGH (ref 134–144)

## 2016-11-02 LAB — SPECIMEN STATUS REPORT

## 2016-11-02 NOTE — Telephone Encounter (Signed)
Encounter complete. 

## 2016-11-03 ENCOUNTER — Other Ambulatory Visit (INDEPENDENT_AMBULATORY_CARE_PROVIDER_SITE_OTHER): Payer: Medicare HMO

## 2016-11-03 DIAGNOSIS — E785 Hyperlipidemia, unspecified: Secondary | ICD-10-CM | POA: Diagnosis not present

## 2016-11-03 DIAGNOSIS — D691 Qualitative platelet defects: Secondary | ICD-10-CM | POA: Diagnosis not present

## 2016-11-03 LAB — COMPREHENSIVE METABOLIC PANEL
ALT: 19 U/L (ref 0–53)
AST: 23 U/L (ref 0–37)
Albumin: 4.4 g/dL (ref 3.5–5.2)
Alkaline Phosphatase: 63 U/L (ref 39–117)
BILIRUBIN TOTAL: 0.6 mg/dL (ref 0.2–1.2)
BUN: 18 mg/dL (ref 6–23)
CALCIUM: 9.7 mg/dL (ref 8.4–10.5)
CO2: 27 meq/L (ref 19–32)
Chloride: 106 mEq/L (ref 96–112)
Creatinine, Ser: 1.11 mg/dL (ref 0.40–1.50)
GFR: 70.58 mL/min (ref >60.00)
Glucose, Bld: 99 mg/dL (ref 70–99)
Potassium: 3.9 mEq/L (ref 3.5–5.1)
Sodium: 142 mEq/L (ref 135–145)
Total Protein: 7 g/dL (ref 6.0–8.3)

## 2016-11-03 LAB — CBC WITH DIFFERENTIAL/PLATELET
BASOS ABS: 0 10*3/uL (ref 0.0–0.1)
Basophils Relative: 0.5 % (ref 0.0–3.0)
Eosinophils Absolute: 0 10*3/uL (ref 0.0–0.7)
Eosinophils Relative: 0.4 % (ref 0.0–5.0)
HEMATOCRIT: 40.9 % (ref 39.0–52.0)
Hemoglobin: 13.2 g/dL (ref 13.0–17.0)
LYMPHS PCT: 22.5 % (ref 12.0–46.0)
Lymphs Abs: 1.2 10*3/uL (ref 0.7–4.0)
MCHC: 32.3 g/dL (ref 30.0–36.0)
MCV: 98.2 fl (ref 78.0–100.0)
MONOS PCT: 6.2 % (ref 3.0–12.0)
Monocytes Absolute: 0.3 10*3/uL (ref 0.1–1.0)
NEUTROS ABS: 3.8 10*3/uL (ref 1.4–7.7)
Neutrophils Relative %: 70.4 % (ref 43.0–77.0)
PLATELETS: 148 10*3/uL — AB (ref 150.0–400.0)
RBC: 4.16 Mil/uL — ABNORMAL LOW (ref 4.22–5.81)
RDW: 13.5 % (ref 11.5–15.5)
WBC: 5.4 10*3/uL (ref 4.0–10.5)

## 2016-11-03 LAB — LIPID PANEL
CHOL/HDL RATIO: 3
Cholesterol: 134 mg/dL (ref 0–200)
HDL: 52.4 mg/dL (ref >39.00)
LDL CALC: 67 mg/dL (ref 0–99)
NonHDL: 81.69
TRIGLYCERIDES: 72 mg/dL (ref 0.0–149.0)
VLDL: 14.4 mg/dL (ref 0.0–40.0)

## 2016-11-06 ENCOUNTER — Other Ambulatory Visit: Payer: Self-pay | Admitting: Family Medicine

## 2016-11-06 DIAGNOSIS — K219 Gastro-esophageal reflux disease without esophagitis: Secondary | ICD-10-CM

## 2016-11-07 ENCOUNTER — Ambulatory Visit (HOSPITAL_COMMUNITY)
Admission: RE | Admit: 2016-11-07 | Discharge: 2016-11-07 | Disposition: A | Discharge status: Home / Self Care | Payer: Medicare HMO | Source: Ambulatory Visit | Attending: Cardiovascular Disease | Admitting: Cardiovascular Disease

## 2016-11-07 DIAGNOSIS — E785 Hyperlipidemia, unspecified: Secondary | ICD-10-CM | POA: Insufficient documentation

## 2016-11-07 DIAGNOSIS — R079 Chest pain, unspecified: Secondary | ICD-10-CM | POA: Diagnosis not present

## 2016-11-07 DIAGNOSIS — Z87891 Personal history of nicotine dependence: Secondary | ICD-10-CM | POA: Diagnosis not present

## 2016-11-07 DIAGNOSIS — G20 Parkinson's disease: Secondary | ICD-10-CM | POA: Diagnosis not present

## 2016-11-07 DIAGNOSIS — E119 Type 2 diabetes mellitus without complications: Secondary | ICD-10-CM | POA: Diagnosis not present

## 2016-11-07 DIAGNOSIS — I2510 Atherosclerotic heart disease of native coronary artery without angina pectoris: Secondary | ICD-10-CM | POA: Insufficient documentation

## 2016-11-07 LAB — MYOCARDIAL PERFUSION IMAGING
CHL CUP NUCLEAR SSS: 6
LVDIAVOL: 98 mL (ref 62–150)
LVSYSVOL: 42 mL
Peak HR: 80 {beats}/min
Rest HR: 56 {beats}/min
SDS: 6
SRS: 0
TID: 0.91

## 2016-11-07 MED ORDER — TECHNETIUM TC 99M TETROFOSMIN IV KIT
32.0000 | PACK | Freq: Once | INTRAVENOUS | Status: AC | PRN
  Administered 2016-11-07: 32 via INTRAVENOUS
  Filled 2016-11-07: qty 32

## 2016-11-07 MED ORDER — REGADENOSON 0.4 MG/5ML IV SOLN
0.4000 mg | Freq: Once | INTRAVENOUS | Status: AC
  Administered 2016-11-07: 0.4 mg via INTRAVENOUS

## 2016-11-07 MED ORDER — TECHNETIUM TC 99M TETROFOSMIN IV KIT
10.2000 | PACK | Freq: Once | INTRAVENOUS | Status: AC | PRN
  Administered 2016-11-07: 10.2 via INTRAVENOUS
  Filled 2016-11-07: qty 11

## 2016-11-07 MED ORDER — AMINOPHYLLINE 25 MG/ML IV SOLN
75.0000 mg | Freq: Once | INTRAVENOUS | Status: AC
  Administered 2016-11-07: 75 mg via INTRAVENOUS

## 2016-11-09 ENCOUNTER — Telehealth: Payer: Self-pay | Admitting: Physician Assistant

## 2016-11-09 NOTE — Telephone Encounter (Signed)
Mr.Kwong is returning a call.. Thanks

## 2016-11-09 NOTE — Telephone Encounter (Signed)
SPOKE TO PT ABOUT STRESS TEST RESULTS AND C/O OF CP SINCE ER VISIT.   PT SAYS HE'S DOING OKAY RIGHT NOW AND HAS A PPT WITH [facility] ON 11-27-16 AND WOULD LIKE TO JUST KEEP THAT.

## 2016-11-14 DIAGNOSIS — H2513 Age-related nuclear cataract, bilateral: Secondary | ICD-10-CM | POA: Diagnosis not present

## 2016-11-26 NOTE — Progress Notes (Signed)
Cardiology Office Note Date:  11/28/2016  Patient ID:  Jeremy Walker, Jeremy Walker 1950-12-26, MRN [id number] PCP:  Carollee Herter, Alferd Apa, DO  Cardiologist/Electrophysiologist: Dr. Caryl Comes    Chief Complaint: f/u test results  History of Present Illness: Jeremy Walker is a 66 y.o. male with history of CAD, remote cardiac arrest w/ICD (initially an abdominal implant) record indicates cardiac arrest was in the environment of severe hypokalemia of unknown cause.  PMHx also includes recent diagnosis of Parkinson's, as well as DM, chronic CHF (systolic), depression, LD, hypothyroidism.   Last ischemic evaluation was myoview scanning >>> demonstrated inferior and inferolateral ischemia, normal LV function. Cardiac catheterization was arranged. This was performed 04/08/15 and demonstrated mid RCA occlusion which was treated with Promus DES 2. There was concern for possible thrombus in the LV apex on left ventriculogram. Echocardiogram was obtained and demonstrated EF 40-45%. There is no evidence of thrombus.  He comes in to day to be seen for Dr. Caryl Comes, last seen by him Dec 2017, at that time given hx of severe hypokalemia his ACE was stopped, aldactone started with plans for f/u labs.  Otherwise no reported symptoms.  He was under personal stressors at the time.  I saw him in July, he told me that he was talking to his sister-in-law who called him and reported that she had been found with a blood count of 5 and was having symptoms that sound like his.  He reports when he is working in the yard (and only with this activity) he feels a very diffuse chest discomfort, non-radiating, no associated SOB, palpitations, no diaphoresis, N/V, it resolves about 15 minutes after rest.  It does not occur with other activities or at rest.  When asked what he thought the symptom may be from, or if it was similar to his heart symptoms prior to his last cath/PCI, he says it feels nothing like his heart symptom, and that it feels like  he may have cancer.  He is unable to tell me how long he has been getting this for, the best he can narrow down to is "a while" maybe a couple months or so.  He is very concerned/upset about his fairly recent diagnosis of Parkinson's and remarks there was a day when he was a strong young man.  He is concerned about LLE swelling that is intermittent, and feels like no one is paying attention to this.  It has been going on about 3 months, is usually resolved by AM, accumulates through the day and since his PMD suggested a support stocking has been better controlled, this is a source of concern for him, says beyond recommending the stocking, nothing else has been done to investigate it  He was sent for testing, LE venous US was negative for DVT, stress test was low riks with small mild IW defect that suspect to be artifact.  He had a ER visit with c/o CP that he left AMA   Since that time he states he has felt great!  He has been working on the property next door clearing the land and has not felt any CP.  He thinks was a medication side effect, but can not say exactly which one and does not appear to have stopped anything. He has not felt the need to use the s/l NTG without any ongoing c/o CP.  He denies palpitations, no dizziness, near syncope or syncope.  He reports his PMD increased his aldactone medicine and his swelling is improved, also  stopped his ASA and had him stay on Plavix.  In review of note, ?2/2 anorexia  Device information: [employer] single chamber ICD, 1994, gen change 2012 (had gen change in between)   Past Medical History:  Diagnosis Date  . AICD (automatic cardioverter/defibrillator) present    Abdominal implant was 0064 Guidant lead 6/5 mm  . Anxiety   . Atrial fibrillation ([place])   . CAD (coronary artery disease) 03/2015   a. DESx2 to mid and distal RCA  . Childhood asthma   . Chronic combined systolic and diastolic CHF (congestive heart failure) ([place])    a. ischemic CM - EF 40-45  in 12/16 // b. Echo 12/17: Echo 12/17: EF 50-55, normal wall motion, grade 1 diastolic dysfunction  . Depression   . GERD (gastroesophageal reflux disease)   . History of hiatal hernia   . HLD (hyperlipidemia)   . Hypokalemia    Recurrent associated with cardiac arrest  . Hypothyroidism   . Leg pain    ABIs 12/17: normal   . Sudden cardiac death ([place]) 1994   aborted  . Thyroid disease   . Tobacco abuse    02-16-14 past hx. heavy smoker, quit < 81yr.    Past Surgical History:  Procedure Laterality Date  . CARDIAC CATHETERIZATION N/A 04/08/2015   Procedure: Left Heart Cath and Coronary Angiography;  Surgeon: Burnell Blanks, MD;  Location: [facility];  Service: Cardiovascular;  Laterality: N/A;  . CARDIAC CATHETERIZATION N/A 04/08/2015   Procedure: Coronary Stent Intervention;  Surgeon: Burnell Blanks, MD;  Location: [facility];  Service: Cardiovascular;  Laterality: N/A;  . CARDIAC DEFIBRILLATOR PLACEMENT  1991   guidant  . EUS N/A 02/26/2014   Procedure: ESOPHAGEAL ENDOSCOPIC ULTRASOUND (EUS) RADIAL;  Surgeon: Milus Banister, MD;  Location: [facility];  Service: Endoscopy;  Laterality: N/A;  radial linear  . HAND SURGERY Left 1965   "got it caught in a conveyor belt"    Current Outpatient Prescriptions  Medication Sig Dispense Refill  . atenolol (TENORMIN) 25 MG tablet Take 1 tablet (25 mg total) by mouth daily. 90 tablet 1  . clopidogrel (PLAVIX) 75 MG tablet Take 1 tablet (75 mg total) by mouth daily. 90 tablet 3  . fenofibrate 160 MG tablet Take 1 tablet (160 mg total) by mouth daily. 90 tablet 3  . levothyroxine (SYNTHROID, LEVOTHROID) 50 MCG tablet TAKE 1 TABLET EVERY DAY 90 tablet 0  . nitroGLYCERIN (NITROSTAT) 0.4 MG SL tablet Place 0.4 mg under the tongue every 5 (five) minutes as needed.     . pantoprazole (PROTONIX) 40 MG tablet TAKE 1 TABLET TWICE DAILY 180 tablet 0  . pramipexole (MIRAPEX) 0.5 MG tablet Take 1 tablet (0.5 mg total) by  mouth 3 (three) times daily. 270 tablet 0  . simvastatin (ZOCOR) 40 MG tablet TAKE 1 TABLET (40 MG TOTAL) BY MOUTH DAILY AT 6 PM. 90 tablet 0  . spironolactone (ALDACTONE) 25 MG tablet Take 0.5 tablets (12.5 mg total) by mouth daily. 135 tablet 0   No current facility-administered medications for this visit.     Allergies:   Patient has no known allergies.   Social History:  The patient  reports that he quit smoking about 3 years ago. His smoking use included Cigarettes. He has a 63.00 pack-year smoking history. He has never used smokeless tobacco. He reports that he does not drink alcohol or use drugs.   Family History:  The patient's family history includes Cancer in  his father; Diabetes in his brother, maternal aunt, and mother; Heart failure in his mother; Lung cancer in his brother; Ovarian cancer in his mother; Parkinson's disease in his mother; Scoliosis in his mother.  ROS:  Please see the history of present illness.    All other systems are reviewed and otherwise negative.   PHYSICAL EXAM:  VS:  BP 122/76   Pulse 62   Ht 5\' 11"  (1.803 m)   Wt 164 lb (74.4 kg)   BMI 22.87 kg/m  BMI: Body mass index is 22.87 kg/m. Well nourished, well developed, though thin, in no acute distress  HEENT: normocephalic, atraumatic  Neck: no JVD, carotid bruits or masses Cardiac:  RRR; no significant murmurs, no rubs, or gallops Lungs:  CTA b/l, no wheezing, rhonchi or rales  Abd: soft, nontender MS: no deformity, , age appropriate atrophy Ext: trace if any edema is appreicate Skin: warm and dry, no rash Neuro:  No gross deficits appreciated Psych: euthymic mood, full affect  ICD site (abdomen) is stable, no tethering or discomfort   EKG:  7/2/18SB, appeared unchanged, no ischemic looking findings ICD interrogation done today by industry and reviewed by myself: battery and lead measurements stable from prior checks, <1% Vpace, no VT/observations  11/07/16: stress myoview  The left  ventricular ejection fraction is normal (55-65%).  Nuclear stress EF: 57%.  There was no ST segment deviation noted during stress.  No T wave inversion was noted during stress.  Findings consistent with ischemia.  This is a low risk study.  Defect 1: There is a small defect of mild severity present in the mid inferior and apical inferior location.   Low risk stress nuclear study with a small area of mild ischemia in the inferior wall. Study specificity may be limited due to technical factors. The impression of reversibility in the inferior wall may be artifactual, due to excessive infradiaphragmatic tracer activity. History of PCI for RCA-CTO in December 2016. Normal left ventricular regional and global systolic function.  10/26/16: LE venous US: No evidence of lower extremity deep or superficial venous thrombus or incompetence, bilaterally  03/28/16; TTE Study Conclusions - Left ventricle: The cavity size was normal. Systolic function was   normal. The estimated ejection fraction was in the range of 50%   to 55%. Wall motion was normal; there were no regional wall   motion abnormalities. Doppler parameters are consistent with   abnormal left ventricular relaxation (grade 1 diastolic   dysfunction). Doppler parameters are consistent with   indeterminate ventricular filling pressure. - Aortic valve: Transvalvular velocity was within the normal range.   There was no stenosis. There was no regurgitation. - Mitral valve: Transvalvular velocity was within the normal range.   There was no evidence for stenosis. There was trivial   regurgitation. - Right ventricle: The cavity size was normal. Wall thickness was   normal. Systolic function was normal. - Right atrium: The atrium was normal in size. - Atrial septum: No defect or patent foramen ovale was identified   by color flow Doppler. - Tricuspid valve: There was no regurgitation.   Recent Labs: 08/01/2016: TSH 4.20 10/09/2016:  Magnesium 2.1 11/03/2016: ALT 19; BUN 18; Creatinine, Ser 1.11; Hemoglobin 13.2; Platelets 148.0; Potassium 3.9; Sodium 142  08/01/2016: Direct LDL 75.0 11/03/2016: Cholesterol 134; HDL 52.40; LDL Cholesterol 67; Total CHOL/HDL Ratio 3; Triglycerides 72.0; VLDL 14.4   CrCl cannot be calculated (Patient's most recent lab result is older than the maximum 21 days allowed.).  Wt Readings from Last 3 Encounters:  11/28/16 164 lb (74.4 kg)  11/27/16 165 lb (74.8 kg)  11/07/16 171 lb (77.6 kg)     Other studies reviewed: Additional studies/records reviewed today include: summarized above  ASSESSMENT AND PLAN:  1. Hx of cardiac arrest, ICM     Most recent echo, stress with nml LVEF     Exam/weight does not suggest fluid OL  2. ICD     Stable device function     He does not do remotes, comes in Q 94mo for device checks/visits  3. CAD     on plavix, BB, statin     Stress test was low risk, IW defect may be artifactual, given no ongoing complaints will not pursue further for now      4. HTN     Looks OK  5. HLD     Trigs were high his last lipid profile      He reports his PMD is managing and had discussed his diet with him, he is making adjustments  6. There is mention of AFib on his problem list     I do not know when this occurred or in what context     His device is a single lead device     I do not see this mentioned as an active/known problem by his notes.  Disposition: We will have him back for device clinic visit iin 3 months,OV in  6 months, sooner if needed  Current medicines are reviewed at length with the patient today.  The patient did not have any concerns regarding medicines.  Haywood Lasso, PA-C 11/28/2016 2:23 PM     [place]  [phone] (office)  [phone] (fax)

## 2016-11-27 ENCOUNTER — Encounter: Payer: Self-pay | Admitting: Family Medicine

## 2016-11-27 ENCOUNTER — Ambulatory Visit: Payer: Medicare HMO | Admitting: Physician Assistant

## 2016-11-27 ENCOUNTER — Ambulatory Visit (INDEPENDENT_AMBULATORY_CARE_PROVIDER_SITE_OTHER): Payer: Medicare HMO | Admitting: Family Medicine

## 2016-11-27 VITALS — BP 118/74 | HR 62 | Wt 165.0 lb

## 2016-11-27 DIAGNOSIS — R600 Localized edema: Secondary | ICD-10-CM

## 2016-11-27 DIAGNOSIS — R630 Anorexia: Secondary | ICD-10-CM

## 2016-11-27 DIAGNOSIS — R634 Abnormal weight loss: Secondary | ICD-10-CM

## 2016-11-27 MED ORDER — MIRTAZAPINE 7.5 MG PO TABS: 7.5000 mg | ORAL_TABLET | Freq: Every day | ORAL | 2 refills | Status: DC

## 2016-11-27 NOTE — Assessment & Plan Note (Signed)
Pt states he is eating less because he is not hungry He eats 2 meals a day--- he will add 2 boost or ensure a day F/u 2-3 weeks to check weight

## 2016-11-27 NOTE — Patient Instructions (Signed)
Edema Edema is an abnormal buildup of fluids in your bodytissues. Edema is somewhatdependent on gravity to pull the fluid to the lowest place in your body. That makes the condition more common in the legs and thighs (lower extremities). Painless swelling of the feet and ankles is common and becomes more likely as you get older. It is also common in looser tissues, like around your eyes. When the affected area is squeezed, the fluid may move out of that spot and leave a dent for a few moments. This dent is called pitting. What are the causes? There are many possible causes of edema. Eating too much salt and being on your feet or sitting for a long time can cause edema in your legs and ankles. Hot weather may make edema worse. Common medical causes of edema include:  Heart failure.  Liver disease.  Kidney disease.  Weak blood vessels in your legs.  Cancer.  An injury.  Pregnancy.  Some medications.  Obesity.  What are the signs or symptoms? Edema is usually painless.Your skin may look swollen or shiny. How is this diagnosed? Your health care provider may be able to diagnose edema by asking about your medical history and doing a physical exam. You may need to have tests such as X-rays, an electrocardiogram, or blood tests to check for medical conditions that may cause edema. How is this treated? Edema treatment depends on the cause. If you have heart, liver, or kidney disease, you need the treatment appropriate for these conditions. General treatment may include:  Elevation of the affected body part above the level of your heart.  Compression of the affected body part. Pressure from elastic bandages or support stockings squeezes the tissues and forces fluid back into the blood vessels. This keeps fluid from entering the tissues.  Restriction of fluid and salt intake.  Use of a water pill (diuretic). These medications are appropriate only for some types of edema. They pull fluid  out of your body and make you urinate more often. This gets rid of fluid and reduces swelling, but diuretics can have side effects. Only use diuretics as directed by your health care provider.  Follow these instructions at home:  Keep the affected body part above the level of your heart when you are lying down.  Do not sit still or stand for prolonged periods.  Do not put anything directly under your knees when lying down.  Do not wear constricting clothing or garters on your upper legs.  Exercise your legs to work the fluid back into your blood vessels. This may help the swelling go down.  Wear elastic bandages or support stockings to reduce ankle swelling as directed by your health care provider.  Eat a low-salt diet to reduce fluid if your health care provider recommends it.  Only take medicines as directed by your health care provider. Contact a health care provider if:  Your edema is not responding to treatment.  You have heart, liver, or kidney disease and notice symptoms of edema.  You have edema in your legs that does not improve after elevating them.  You have sudden and unexplained weight gain. Get help right away if:  You develop shortness of breath or chest pain.  You cannot breathe when you lie down.  You develop pain, redness, or warmth in the swollen areas.  You have heart, liver, or kidney disease and suddenly get edema.  You have a fever and your symptoms suddenly get worse. This information is   not intended to replace advice given to you by your health care provider. Make sure you discuss any questions you have with your health care provider. Document Released: 03/27/2005 Document Revised: 09/02/2015 Document Reviewed: 01/17/2013 Elsevier Interactive Patient Education  2017 Elsevier Inc.  

## 2016-11-27 NOTE — Progress Notes (Signed)
Patient ID: Jeremy Walker, male    DOB: [birth year]  Age: 66 y.o. MRN: [id number]    Subjective:  Subjective  HPI Jerremy Maione presents for f/u and c/o swelling in his feet.  No sob , no cp.    Review of Systems  Constitutional: Negative for appetite change, diaphoresis, fatigue and unexpected weight change.  Eyes: Negative for pain, redness and visual disturbance.  Respiratory: Negative for cough, chest tightness, shortness of breath and wheezing.   Cardiovascular: Positive for leg swelling. Negative for chest pain and palpitations.  Endocrine: Negative for cold intolerance, heat intolerance, polydipsia, polyphagia and polyuria.  Genitourinary: Negative for difficulty urinating, dysuria and frequency.  Neurological: Negative for dizziness, light-headedness, numbness and headaches.    History Past Medical History:  Diagnosis Date  . AICD (automatic cardioverter/defibrillator) present    Abdominal implant was 0064 Guidant lead 6/5 mm  . Anxiety   . Atrial fibrillation ([place])   . CAD (coronary artery disease) 03/2015   a. DESx2 to mid and distal RCA  . Childhood asthma   . Chronic combined systolic and diastolic CHF (congestive heart failure) ([place])    a. ischemic CM - EF 40-45 in 12/16 // b. Echo 12/17: Echo 12/17: EF 50-55, normal wall motion, grade 1 diastolic dysfunction  . Depression   . GERD (gastroesophageal reflux disease)   . History of hiatal hernia   . HLD (hyperlipidemia)   . Hypokalemia    Recurrent associated with cardiac arrest  . Hypothyroidism   . Leg pain    ABIs 12/17: normal   . Sudden cardiac death ([place]) 1994   aborted  . Thyroid disease   . Tobacco abuse    02-16-14 past hx. heavy smoker, quit < 84yr.    He has a past surgical history that includes Cardiac defibrillator placement (1991); Hand surgery (Left, 1965); EUS (N/A, 02/26/2014); Cardiac catheterization (N/A, 04/08/2015); and Cardiac catheterization (N/A, 04/08/2015).   His family history includes  Cancer in his father; Diabetes in his brother, maternal aunt, and mother; Heart failure in his mother; Lung cancer in his brother; Ovarian cancer in his mother; Parkinson's disease in his mother; Scoliosis in his mother.He reports that he quit smoking about 3 years ago. His smoking use included Cigarettes. He has a 63.00 pack-year smoking history. He has never used smokeless tobacco. He reports that he does not drink alcohol or use drugs.  Current Outpatient Prescriptions on File Prior to Visit  Medication Sig Dispense Refill  . atenolol (TENORMIN) 25 MG tablet Take 1 tablet (25 mg total) by mouth daily. 90 tablet 1  . clopidogrel (PLAVIX) 75 MG tablet Take 1 tablet (75 mg total) by mouth daily. 90 tablet 3  . fenofibrate 160 MG tablet Take 1 tablet (160 mg total) by mouth daily. 90 tablet 3  . levothyroxine (SYNTHROID, LEVOTHROID) 50 MCG tablet TAKE 1 TABLET EVERY DAY 90 tablet 0  . pantoprazole (PROTONIX) 40 MG tablet TAKE 1 TABLET TWICE DAILY 180 tablet 0  . pramipexole (MIRAPEX) 0.5 MG tablet Take 1 tablet (0.5 mg total) by mouth 3 (three) times daily. 270 tablet 0  . simvastatin (ZOCOR) 40 MG tablet TAKE 1 TABLET (40 MG TOTAL) BY MOUTH DAILY AT 6 PM. 90 tablet 0  . spironolactone (ALDACTONE) 25 MG tablet Take 0.5 tablets (12.5 mg total) by mouth daily. 135 tablet 0   No current facility-administered medications on file prior to visit.      Objective:  Objective  Physical Exam  Constitutional: He is  oriented to person, place, and time. Vital signs are normal. He appears well-developed and well-nourished. He is sleeping.  HENT:  Head: Normocephalic and atraumatic.  Mouth/Throat: Oropharynx is clear and moist.  Eyes: Pupils are equal, round, and reactive to light. EOM are normal.  Neck: Normal range of motion. Neck supple. No thyromegaly present.  Cardiovascular: Normal rate and regular rhythm.   No murmur heard. Pulmonary/Chest: Effort normal and breath sounds normal. No respiratory  distress. He has no wheezes. He has no rales. He exhibits no tenderness.  Musculoskeletal: He exhibits edema. He exhibits no tenderness.  Neurological: He is alert and oriented to person, place, and time.  Skin: Skin is warm and dry.  Psychiatric: He has a normal mood and affect. His behavior is normal. Judgment and thought content normal.  Nursing note and vitals reviewed.  BP 118/74   Pulse 62   Wt 165 lb (74.8 kg)   SpO2 96%   BMI 23.01 kg/m  Wt Readings from Last 3 Encounters:  11/28/16 164 lb (74.4 kg)  11/27/16 165 lb (74.8 kg)  11/07/16 171 lb (77.6 kg)     Lab Results  Component Value Date   WBC 5.4 11/03/2016   HGB 13.2 11/03/2016   HCT 40.9 11/03/2016   PLT 148.0 (L) 11/03/2016   GLUCOSE 99 11/03/2016   CHOL 134 11/03/2016   TRIG 72.0 11/03/2016   HDL 52.40 11/03/2016   LDLDIRECT 75.0 08/01/2016   LDLCALC 67 11/03/2016   ALT 19 11/03/2016   AST 23 11/03/2016   NA 142 11/03/2016   K 3.9 11/03/2016   CL 106 11/03/2016   CREATININE 1.11 11/03/2016   BUN 18 11/03/2016   CO2 27 11/03/2016   TSH 4.20 08/01/2016   PSA 0.98 05/06/2015   INR 0.96 09/07/2015   HGBA1C 5.9 (H) 04/14/2016    No results found.   Assessment & Plan:  Plan  I have discontinued Mr. Bedgood's aspirin and nitroGLYCERIN. I am also having him maintain his pramipexole, spironolactone, levothyroxine, simvastatin, clopidogrel, fenofibrate, atenolol, and pantoprazole.  Meds ordered this encounter  Medications  . DISCONTD: mirtazapine (REMERON) 7.5 MG tablet    Sig: Take 1 tablet (7.5 mg total) by mouth at bedtime.    Dispense:  30 tablet    Refill:  2    Problem List Items Addressed This Visit      Unprioritized   Weight loss    Pt states he is eating less because he is not hungry He eats 2 meals a day--- he will add 2 boost or ensure a day F/u 2-3 weeks to check weight      Edema of right lower extremity    Pt prefers to wait until he see cardiology to adjust meds       Other  Visit Diagnoses    Anorexia    -  Primary      Follow-up: Return in about 2 weeks (around 12/11/2016) for weight check.  Ann Held, DO

## 2016-11-28 ENCOUNTER — Encounter (INDEPENDENT_AMBULATORY_CARE_PROVIDER_SITE_OTHER): Payer: Self-pay

## 2016-11-28 ENCOUNTER — Ambulatory Visit (INDEPENDENT_AMBULATORY_CARE_PROVIDER_SITE_OTHER): Payer: Medicare HMO | Admitting: Physician Assistant

## 2016-11-28 VITALS — BP 122/76 | HR 62 | Ht 71.0 in | Wt 164.0 lb

## 2016-11-28 DIAGNOSIS — I10 Essential (primary) hypertension: Secondary | ICD-10-CM

## 2016-11-28 DIAGNOSIS — Z79899 Other long term (current) drug therapy: Secondary | ICD-10-CM

## 2016-11-28 DIAGNOSIS — Z95810 Presence of automatic (implantable) cardiac defibrillator: Secondary | ICD-10-CM | POA: Diagnosis not present

## 2016-11-28 DIAGNOSIS — I255 Ischemic cardiomyopathy: Secondary | ICD-10-CM | POA: Diagnosis not present

## 2016-11-28 DIAGNOSIS — I2510 Atherosclerotic heart disease of native coronary artery without angina pectoris: Secondary | ICD-10-CM

## 2016-11-28 DIAGNOSIS — I5042 Chronic combined systolic (congestive) and diastolic (congestive) heart failure: Secondary | ICD-10-CM | POA: Diagnosis not present

## 2016-11-28 NOTE — Patient Instructions (Addendum)
Medication Instructions:   Your physician recommends that you continue on your current medications as directed. Please refer to the Current Medication list given to you today.   If you need a refill on your cardiac medications before your next appointment, please call your pharmacy.  Labwork: RETURN FOR BMET IN ONE WEEK    Testing/Procedures: NONE ORDERED  TODAY   Follow-Up:   IN 3 MONTHS WITH DEVICE CLINIC   Your physician wants you to follow-up in:  [place]  [place] will receive a reminder letter in the mail two months in advance. If you don't receive a letter, please call our office to schedule the follow-up appointment.       Any Other Special Instructions Will Be Listed Below (If Applicable).

## 2016-11-28 NOTE — Assessment & Plan Note (Signed)
Pt prefers to wait until he see cardiology to adjust meds

## 2016-12-05 ENCOUNTER — Other Ambulatory Visit: Payer: Medicare HMO

## 2016-12-05 ENCOUNTER — Other Ambulatory Visit: Payer: Self-pay | Admitting: Family Medicine

## 2016-12-05 DIAGNOSIS — Z79899 Other long term (current) drug therapy: Secondary | ICD-10-CM | POA: Diagnosis not present

## 2016-12-05 DIAGNOSIS — G20 Parkinson's disease: Secondary | ICD-10-CM

## 2016-12-05 LAB — BASIC METABOLIC PANEL
BUN/Creatinine Ratio: 26 — ABNORMAL HIGH (ref 10–24)
BUN: 27 mg/dL (ref 8–27)
CALCIUM: 9.4 mg/dL (ref 8.6–10.2)
CHLORIDE: 102 mmol/L (ref 96–106)
CO2: 25 mmol/L (ref 20–29)
Creatinine, Ser: 1.03 mg/dL (ref 0.76–1.27)
GFR calc Af Amer: 88 mL/min/{1.73_m2} (ref >59)
GFR calc non Af Amer: 76 mL/min/{1.73_m2} (ref >59)
GLUCOSE: 112 mg/dL — AB (ref 65–99)
Potassium: 4.5 mmol/L (ref 3.5–5.2)
Sodium: 142 mmol/L (ref 134–144)

## 2016-12-06 ENCOUNTER — Other Ambulatory Visit: Payer: Self-pay

## 2016-12-06 DIAGNOSIS — E785 Hyperlipidemia, unspecified: Secondary | ICD-10-CM

## 2016-12-06 DIAGNOSIS — I2510 Atherosclerotic heart disease of native coronary artery without angina pectoris: Secondary | ICD-10-CM

## 2016-12-06 MED ORDER — SIMVASTATIN 40 MG PO TABS: 40.0000 mg | ORAL_TABLET | Freq: Every day | ORAL | 1 refills | Status: DC

## 2016-12-08 ENCOUNTER — Other Ambulatory Visit: Payer: Self-pay | Admitting: Family Medicine

## 2016-12-08 NOTE — Telephone Encounter (Signed)
asa was Jeremy Walker/C'ed at 8.20.18 visit/thx dmf

## 2016-12-15 ENCOUNTER — Other Ambulatory Visit: Payer: Self-pay

## 2016-12-15 DIAGNOSIS — I10 Essential (primary) hypertension: Secondary | ICD-10-CM

## 2016-12-15 MED ORDER — SPIRONOLACTONE 25 MG PO TABS: 12.5000 mg | ORAL_TABLET | Freq: Every day | ORAL | 0 refills | Status: DC

## 2016-12-15 NOTE — Progress Notes (Unsigned)
Received paper refill request for Spironolactone. Per Last ov note Dr. Etter Sjogren wanted pt to continue. Refills sent in.

## 2017-01-10 ENCOUNTER — Other Ambulatory Visit: Payer: Self-pay | Admitting: Family Medicine

## 2017-01-10 DIAGNOSIS — K219 Gastro-esophageal reflux disease without esophagitis: Secondary | ICD-10-CM

## 2017-02-01 ENCOUNTER — Encounter: Payer: Self-pay | Admitting: Family Medicine

## 2017-02-01 ENCOUNTER — Ambulatory Visit: Payer: Medicare HMO | Admitting: Family Medicine

## 2017-02-01 ENCOUNTER — Ambulatory Visit (INDEPENDENT_AMBULATORY_CARE_PROVIDER_SITE_OTHER): Payer: Medicare HMO | Admitting: Family Medicine

## 2017-02-01 ENCOUNTER — Telehealth: Payer: Self-pay | Admitting: *Deleted

## 2017-02-01 VITALS — BP 120/80 | HR 70 | Temp 97.6°F | Ht 71.0 in | Wt 167.0 lb

## 2017-02-01 DIAGNOSIS — E039 Hypothyroidism, unspecified: Secondary | ICD-10-CM

## 2017-02-01 DIAGNOSIS — E785 Hyperlipidemia, unspecified: Secondary | ICD-10-CM | POA: Diagnosis not present

## 2017-02-01 DIAGNOSIS — I10 Essential (primary) hypertension: Secondary | ICD-10-CM | POA: Diagnosis not present

## 2017-02-01 DIAGNOSIS — Z95810 Presence of automatic (implantable) cardiac defibrillator: Secondary | ICD-10-CM | POA: Diagnosis not present

## 2017-02-01 DIAGNOSIS — G20 Parkinson's disease: Secondary | ICD-10-CM | POA: Diagnosis not present

## 2017-02-01 DIAGNOSIS — E038 Other specified hypothyroidism: Secondary | ICD-10-CM | POA: Diagnosis not present

## 2017-02-01 DIAGNOSIS — E782 Mixed hyperlipidemia: Secondary | ICD-10-CM

## 2017-02-01 DIAGNOSIS — I2510 Atherosclerotic heart disease of native coronary artery without angina pectoris: Secondary | ICD-10-CM

## 2017-02-01 LAB — MICROALBUMIN / CREATININE URINE RATIO
Creatinine,U: 172 mg/dL
MICROALB UR: 0.8 mg/dL (ref 0.0–1.9)
MICROALB/CREAT RATIO: 0.5 mg/g (ref 0.0–30.0)

## 2017-02-01 LAB — POC URINALSYSI DIPSTICK (AUTOMATED)
Bilirubin, UA: NEGATIVE
Blood, UA: NEGATIVE
Glucose, UA: NEGATIVE
KETONES UA: NEGATIVE
LEUKOCYTES UA: NEGATIVE
Nitrite, UA: NEGATIVE
PH UA: 6 (ref 5.0–8.0)
PROTEIN UA: NEGATIVE
Urobilinogen, UA: 0.2 E.U./dL

## 2017-02-01 NOTE — Assessment & Plan Note (Signed)
Per cardio 

## 2017-02-01 NOTE — Assessment & Plan Note (Signed)
Tolerating statin, encouraged heart healthy diet, avoid trans fats, minimize simple carbs and saturated fats. Increase exercise as tolerated 

## 2017-02-01 NOTE — Progress Notes (Signed)
Patient ID: Jeremy Walker, male    DOB: [birth year]  Age: 66 y.o. MRN: [id number]    Subjective:  Subjective  HPI Jeremy Walker presents for f/u and labs.  Pt would like to schedule his wellness .  No new complaints.   Review of Systems  Constitutional: Negative for appetite change, diaphoresis, fatigue and unexpected weight change.  Eyes: Negative for pain, redness and visual disturbance.  Respiratory: Negative for cough, chest tightness, shortness of breath and wheezing.   Cardiovascular: Negative for chest pain, palpitations and leg swelling.  Endocrine: Negative for cold intolerance, heat intolerance, polydipsia, polyphagia and polyuria.  Genitourinary: Negative for difficulty urinating, dysuria and frequency.  Neurological: Negative for dizziness, light-headedness, numbness and headaches.    History Past Medical History:  Diagnosis Date  . AICD (automatic cardioverter/defibrillator) present    Abdominal implant was 0064 Guidant lead 6/5 mm  . Anxiety   . Atrial fibrillation ([place])   . CAD (coronary artery disease) 03/2015   a. DESx2 to mid and distal RCA  . Childhood asthma   . Chronic combined systolic and diastolic CHF (congestive heart failure) ([place])    a. ischemic CM - EF 40-45 in 12/16 // b. Echo 12/17: Echo 12/17: EF 50-55, normal wall motion, grade 1 diastolic dysfunction  . Depression   . GERD (gastroesophageal reflux disease)   . History of hiatal hernia   . HLD (hyperlipidemia)   . Hypokalemia    Recurrent associated with cardiac arrest  . Hypothyroidism   . Leg pain    ABIs 12/17: normal   . Sudden cardiac death ([place]) 1994   aborted  . Thyroid disease   . Tobacco abuse    02-16-14 past hx. heavy smoker, quit < 62yr.    Jeremy Walker has a past surgical history that includes Cardiac defibrillator placement (1991); Hand surgery (Left, 1965); EUS (N/A, 02/26/2014); Cardiac catheterization (N/A, 04/08/2015); and Cardiac catheterization (N/A, 04/08/2015).   His family history  includes Cancer in his father; Diabetes in his brother, maternal aunt, and mother; Heart failure in his mother; Lung cancer in his brother; Ovarian cancer in his mother; Parkinson's disease in his mother; Scoliosis in his mother.Jeremy Walker reports that Jeremy Walker quit smoking about 3 years ago. His smoking use included Cigarettes. Jeremy Walker has a 63.00 pack-year smoking history. Jeremy Walker has never used smokeless tobacco. Jeremy Walker reports that Jeremy Walker does not drink alcohol or use drugs.  Current Outpatient Prescriptions on File Prior to Visit  Medication Sig Dispense Refill  . atenolol (TENORMIN) 25 MG tablet Take 1 tablet (25 mg total) by mouth daily. 90 tablet 1  . clopidogrel (PLAVIX) 75 MG tablet Take 1 tablet (75 mg total) by mouth daily. 90 tablet 3  . fenofibrate 160 MG tablet Take 1 tablet (160 mg total) by mouth daily. 90 tablet 3  . levothyroxine (SYNTHROID, LEVOTHROID) 50 MCG tablet Take 1 tablet (50 mcg total) by mouth daily before breakfast. 90 tablet 0  . pantoprazole (PROTONIX) 40 MG tablet TAKE 1 TABLET TWICE DAILY 180 tablet 0  . pramipexole (MIRAPEX) 0.5 MG tablet Take 1 tablet (0.5 mg total) by mouth 3 (three) times daily. 270 tablet 0  . simvastatin (ZOCOR) 40 MG tablet Take 1 tablet (40 mg total) by mouth daily at 6 PM. 90 tablet 1  . spironolactone (ALDACTONE) 25 MG tablet Take 0.5 tablets (12.5 mg total) by mouth daily. 135 tablet 0  . nitroGLYCERIN (NITROSTAT) 0.4 MG SL tablet Place 0.4 mg under the tongue every 5 (five) minutes as needed.  No current facility-administered medications on file prior to visit.      Objective:  Objective  Physical Exam  Constitutional: Jeremy Walker is oriented to person, place, and time. Vital signs are normal. Jeremy Walker appears well-developed and well-nourished. Jeremy Walker is sleeping.  HENT:  Head: Normocephalic and atraumatic.  Mouth/Throat: Oropharynx is clear and moist.  Eyes: Pupils are equal, round, and reactive to light. EOM are normal.  Neck: Normal range of motion. Neck supple. No  thyromegaly present.  Cardiovascular: Normal rate and regular rhythm.   No murmur heard. Pulmonary/Chest: Effort normal and breath sounds normal. No respiratory distress. Jeremy Walker has no wheezes. Jeremy Walker has no rales. Jeremy Walker exhibits no tenderness.  Musculoskeletal: Jeremy Walker exhibits no edema or tenderness.  Neurological: Jeremy Walker is alert and oriented to person, place, and time.  Skin: Skin is warm and dry.  Psychiatric: Jeremy Walker has a normal mood and affect. His behavior is normal. Judgment and thought content normal.  Nursing note and vitals reviewed.  BP 120/80   Pulse 70   Temp 97.6 F (36.4 C) (Oral)   Ht 5\' 11"  (1.803 m)   Wt 167 lb (75.8 kg)   SpO2 97%   BMI 23.29 kg/m  Wt Readings from Last 3 Encounters:  02/01/17 167 lb (75.8 kg)  11/28/16 164 lb (74.4 kg)  11/27/16 165 lb (74.8 kg)     Lab Results  Component Value Date   WBC 5.4 11/03/2016   HGB 13.2 11/03/2016   HCT 40.9 11/03/2016   PLT 148.0 (L) 11/03/2016   GLUCOSE 112 (H) 12/05/2016   CHOL 134 11/03/2016   TRIG 72.0 11/03/2016   HDL 52.40 11/03/2016   LDLDIRECT 75.0 08/01/2016   LDLCALC 67 11/03/2016   ALT 19 11/03/2016   AST 23 11/03/2016   NA 142 12/05/2016   K 4.5 12/05/2016   CL 102 12/05/2016   CREATININE 1.03 12/05/2016   BUN 27 12/05/2016   CO2 25 12/05/2016   TSH 4.20 08/01/2016   PSA 0.98 05/06/2015   INR 0.96 09/07/2015   HGBA1C 5.9 (H) 04/14/2016   MICROALBUR 0.8 02/01/2017    No results found.   Assessment & Plan:  Plan  I am having Jeremy Walker maintain his clopidogrel, fenofibrate, atenolol, nitroGLYCERIN, pramipexole, levothyroxine, simvastatin, spironolactone, and pantoprazole.  No orders of the defined types were placed in this encounter.   Problem List Items Addressed This Visit      Unprioritized   Coronary artery disease involving native coronary artery of native heart without angina pectoris   Relevant Orders   Lipid panel   CBC with Differential/Platelet   Hemoglobin A1c   TSH   Comprehensive  metabolic panel   POCT Urinalysis Dipstick (Automated) (Completed)   Microalbumin / creatinine urine ratio (Completed)   Essential hypertension   Relevant Orders   Lipid panel   CBC with Differential/Platelet   Hemoglobin A1c   TSH   Comprehensive metabolic panel   POCT Urinalysis Dipstick (Automated) (Completed)   Microalbumin / creatinine urine ratio (Completed)   Implantable defibrillator-[employer]    Per cardio      Mixed hyperlipidemia    Tolerating statin, encouraged heart healthy diet, avoid trans fats, minimize simple carbs and saturated fats. Increase exercise as tolerated      Other specified hypothyroidism    Stable Check labs      Parkinson disease [facility])    Per neuro       Other Visit Diagnoses    Hypothyroidism, unspecified type    -  Primary   Relevant Orders   TSH   Hyperlipidemia LDL goal <100       Relevant Orders   Lipid panel   CBC with Differential/Platelet   Hemoglobin A1c   TSH   Comprehensive metabolic panel   POCT Urinalysis Dipstick (Automated) (Completed)   Microalbumin / creatinine urine ratio (Completed)      Follow-up: Return in about 6 months (around 08/02/2017) for hyperlipidemia, diabetes II, hypertension.  Ann Held, DO

## 2017-02-01 NOTE — Telephone Encounter (Signed)
Pt was placed on RN schedule for awv in error. Pt not eligible for awv with RN this year. Pt may wish to schedule welcome to medicare with PCP. Pt needs to come in at 2pm today to see PCP for already scheduled f/u

## 2017-02-01 NOTE — Telephone Encounter (Signed)
Patient returned call and informed patient of message below.

## 2017-02-01 NOTE — Telephone Encounter (Signed)
Patient scheduled Welcome to Medicare appointment with PCP for 06/14/2017

## 2017-02-01 NOTE — Assessment & Plan Note (Signed)
Stable Check labs 

## 2017-02-01 NOTE — Assessment & Plan Note (Signed)
Per neuro 

## 2017-02-01 NOTE — Patient Instructions (Signed)

## 2017-02-02 ENCOUNTER — Telehealth: Payer: Self-pay | Admitting: Family Medicine

## 2017-02-02 LAB — CBC WITH DIFFERENTIAL/PLATELET
BASOS ABS: 0.1 10*3/uL (ref 0.0–0.1)
Basophils Relative: 1.3 % (ref 0.0–3.0)
Eosinophils Absolute: 0.1 10*3/uL (ref 0.0–0.7)
Eosinophils Relative: 1.5 % (ref 0.0–5.0)
HCT: 42.5 % (ref 39.0–52.0)
Hemoglobin: 13.8 g/dL (ref 13.0–17.0)
LYMPHS ABS: 1.1 10*3/uL (ref 0.7–4.0)
LYMPHS PCT: 26.7 % (ref 12.0–46.0)
MCHC: 32.4 g/dL (ref 30.0–36.0)
MCV: 99.9 fl (ref 78.0–100.0)
MONOS PCT: 9.8 % (ref 3.0–12.0)
Monocytes Absolute: 0.4 10*3/uL (ref 0.1–1.0)
NEUTROS ABS: 2.6 10*3/uL (ref 1.4–7.7)
NEUTROS PCT: 60.7 % (ref 43.0–77.0)
RBC: 4.26 Mil/uL (ref 4.22–5.81)
RDW: 13.3 % (ref 11.5–15.5)
WBC: 4.2 10*3/uL (ref 4.0–10.5)

## 2017-02-02 LAB — COMPREHENSIVE METABOLIC PANEL
ALBUMIN: 4.1 g/dL (ref 3.5–5.2)
ALT: 15 U/L (ref 0–53)
AST: 20 U/L (ref 0–37)
Alkaline Phosphatase: 73 U/L (ref 39–117)
BUN: 20 mg/dL (ref 6–23)
CHLORIDE: 104 meq/L (ref 96–112)
CO2: 31 meq/L (ref 19–32)
CREATININE: 0.98 mg/dL (ref 0.40–1.50)
Calcium: 9.4 mg/dL (ref 8.4–10.5)
GFR: 81.43 mL/min (ref >60.00)
GLUCOSE: 102 mg/dL — AB (ref 70–99)
POTASSIUM: 4 meq/L (ref 3.5–5.1)
SODIUM: 142 meq/L (ref 135–145)
Total Bilirubin: 0.3 mg/dL (ref 0.2–1.2)
Total Protein: 6.5 g/dL (ref 6.0–8.3)

## 2017-02-02 LAB — TSH: TSH: 2.59 u[IU]/mL (ref 0.35–4.50)

## 2017-02-02 LAB — HEMOGLOBIN A1C: Hgb A1c MFr Bld: 6.2 % (ref 4.6–6.5)

## 2017-02-02 LAB — LIPID PANEL
CHOL/HDL RATIO: 3
Cholesterol: 157 mg/dL (ref 0–200)
HDL: 57.1 mg/dL (ref >39.00)
LDL Cholesterol: 87 mg/dL (ref 0–99)
NONHDL: 100.32
Triglycerides: 69 mg/dL (ref 0.0–149.0)
VLDL: 13.8 mg/dL (ref 0.0–40.0)

## 2017-02-02 NOTE — Telephone Encounter (Signed)
Relation to VV:OHYW Call back number:[phone]   Reason for call:  Patient inquiring about lab results and stats "Jeremy Walker" called him, please advise

## 2017-02-05 ENCOUNTER — Other Ambulatory Visit: Payer: Self-pay | Admitting: Family Medicine

## 2017-02-05 DIAGNOSIS — D696 Thrombocytopenia, unspecified: Secondary | ICD-10-CM

## 2017-02-05 DIAGNOSIS — G20 Parkinson's disease: Secondary | ICD-10-CM

## 2017-02-05 NOTE — Telephone Encounter (Signed)
Called Pt to advise him to come back in and have his CBC repeated due to low platelet count. Pt states he will comply. During the call Pt stated that his leg is "killing him", however Pt also states he "doesn't like taking pills and tylenol help but are bad" for him. Pt was added to come in for a follow up stating he would like to talk to provider. Scheduled for 11:00 02/06/17

## 2017-02-06 ENCOUNTER — Ambulatory Visit (INDEPENDENT_AMBULATORY_CARE_PROVIDER_SITE_OTHER): Payer: Medicare HMO | Admitting: Family Medicine

## 2017-02-06 ENCOUNTER — Encounter: Payer: Self-pay | Admitting: Family Medicine

## 2017-02-06 VITALS — BP 113/68 | HR 65 | Temp 97.7°F | Resp 16 | Ht 71.0 in | Wt 166.6 lb

## 2017-02-06 DIAGNOSIS — R600 Localized edema: Secondary | ICD-10-CM | POA: Diagnosis not present

## 2017-02-06 LAB — CBC WITH DIFFERENTIAL/PLATELET
Basophils Absolute: 0 10*3/uL (ref 0.0–0.1)
Basophils Relative: 0.6 % (ref 0.0–3.0)
Eosinophils Absolute: 0.1 10*3/uL (ref 0.0–0.7)
Eosinophils Relative: 2.5 % (ref 0.0–5.0)
HCT: 41.2 % (ref 39.0–52.0)
Hemoglobin: 13.4 g/dL (ref 13.0–17.0)
Lymphocytes Relative: 19.4 % (ref 12.0–46.0)
Lymphs Abs: 0.9 10*3/uL (ref 0.7–4.0)
MCHC: 32.6 g/dL (ref 30.0–36.0)
MCV: 99.3 fl (ref 78.0–100.0)
Monocytes Absolute: 0.5 10*3/uL (ref 0.1–1.0)
Monocytes Relative: 12.2 % — ABNORMAL HIGH (ref 3.0–12.0)
Neutro Abs: 2.9 10*3/uL (ref 1.4–7.7)
Neutrophils Relative %: 65.3 % (ref 43.0–77.0)
Platelets: 103 10*3/uL — ABNORMAL LOW (ref 150.0–400.0)
RBC: 4.15 Mil/uL — ABNORMAL LOW (ref 4.22–5.81)
RDW: 13.1 % (ref 11.5–15.5)
WBC: 4.4 10*3/uL (ref 4.0–10.5)

## 2017-02-06 LAB — COMPREHENSIVE METABOLIC PANEL
ALK PHOS: 69 U/L (ref 39–117)
ALT: 11 U/L (ref 0–53)
AST: 16 U/L (ref 0–37)
Albumin: 4 g/dL (ref 3.5–5.2)
BILIRUBIN TOTAL: 0.4 mg/dL (ref 0.2–1.2)
BUN: 21 mg/dL (ref 6–23)
CO2: 32 meq/L (ref 19–32)
CREATININE: 1.03 mg/dL (ref 0.40–1.50)
Calcium: 9.8 mg/dL (ref 8.4–10.5)
Chloride: 105 mEq/L (ref 96–112)
GFR: 76.88 mL/min (ref >60.00)
GLUCOSE: 112 mg/dL — AB (ref 70–99)
Potassium: 4.1 mEq/L (ref 3.5–5.1)
SODIUM: 143 meq/L (ref 135–145)
TOTAL PROTEIN: 6.7 g/dL (ref 6.0–8.3)

## 2017-02-06 MED ORDER — SPIRONOLACTONE 50 MG PO TABS: 50.0000 mg | ORAL_TABLET | Freq: Every day | ORAL | 3 refills | Status: AC

## 2017-02-06 NOTE — Patient Instructions (Signed)
Edema Edema is when you have too much fluid in your body or under your skin. Edema may make your legs, feet, and ankles swell up. Swelling is also common in looser tissues, like around your eyes. This is a common condition. It gets more common as you get older. There are many possible causes of edema. Eating too much salt (sodium) and being on your feet or sitting for a long time can cause edema in your legs, feet, and ankles. Hot weather may make edema worse. Edema is usually painless. Your skin may look swollen or shiny. Follow these instructions at home:  Keep the swollen body part raised (elevated) above the level of your heart when you are sitting or lying down.  Do not sit still or stand for a long time.  Do not wear tight clothes. Do not wear garters on your upper legs.  Exercise your legs. This can help the swelling go down.  Wear elastic bandages or support stockings as told by your doctor.  Eat a low-salt (low-sodium) diet to reduce fluid as told by your doctor.  Depending on the cause of your swelling, you may need to limit how much fluid you drink (fluid restriction).  Take over-the-counter and prescription medicines only as told by your doctor. Contact a doctor if:  Treatment is not working.  You have heart, liver, or kidney disease and have symptoms of edema.  You have sudden and unexplained weight gain. Get help right away if:  You have shortness of breath or chest pain.  You cannot breathe when you lie down.  You have pain, redness, or warmth in the swollen areas.  You have heart, liver, or kidney disease and get edema all of a sudden.  You have a fever and your symptoms get worse all of a sudden. Summary  Edema is when you have too much fluid in your body or under your skin.  Edema may make your legs, feet, and ankles swell up. Swelling is also common in looser tissues, like around your eyes.  Raise (elevate) the swollen body part above the level of your  heart when you are sitting or lying down.  Follow your doctor's instructions about diet and how much fluid you can drink (fluid restriction). This information is not intended to replace advice given to you by your health care provider. Make sure you discuss any questions you have with your health care provider. Document Released: 09/13/2007 Document Revised: 04/14/2016 Document Reviewed: 04/14/2016 Elsevier Interactive Patient Education  2017 Elsevier Inc.  

## 2017-02-06 NOTE — Addendum Note (Signed)
Addended by: Roma Schanz R on: 02/06/2017 11:24 AM   Modules accepted: Orders

## 2017-02-06 NOTE — Progress Notes (Signed)
Patient ID: Jeremy Walker, male   DOB: 04-12-1950, 66 y.o.   MRN: [id number]    Subjective:  I acted as a Education administrator for Dr. Carollee Herter.  Jeremy Walker, [place]   Patient ID: Jeremy Walker, male    DOB: 01/08/1951, 66 y.o.   MRN: [id number]  Chief Complaint  Patient presents with  . Leg Pain    HPI  Patient is in today for right leg pain.  Pain started yesterday.  No known injury.  Patient called EMT out yesterday and they told him he was ok. Pt had an [place] with cardiology and there was not DVT.     Patient Care Team: Carollee Herter, Alferd Apa, DO as PCP - General (Family Medicine) Deboraha Sprang, MD as Consulting Physician (Cardiology) Pyrtle, Lajuan Lines, MD as Consulting Physician (Gastroenterology) Burnell Blanks, MD as Consulting Physician (Cardiology) Angelia Mould, MD as Consulting Physician (Vascular Surgery)   Past Medical History:  Diagnosis Date  . AICD (automatic cardioverter/defibrillator) present    Abdominal implant was 0064 Guidant lead 6/5 mm  . Anxiety   . Atrial fibrillation ([place])   . CAD (coronary artery disease) 03/2015   a. DESx2 to mid and distal RCA  . Childhood asthma   . Chronic combined systolic and diastolic CHF (congestive heart failure) ([place])    a. ischemic CM - EF 40-45 in 12/16 // b. Echo 12/17: Echo 12/17: EF 50-55, normal wall motion, grade 1 diastolic dysfunction  . Depression   . GERD (gastroesophageal reflux disease)   . History of hiatal hernia   . HLD (hyperlipidemia)   . Hypokalemia    Recurrent associated with cardiac arrest  . Hypothyroidism   . Leg pain    ABIs 12/17: normal   . Sudden cardiac death ([place]) 1994   aborted  . Thyroid disease   . Tobacco abuse    02-16-14 past hx. heavy smoker, quit < 68yr.    Past Surgical History:  Procedure Laterality Date  . CARDIAC CATHETERIZATION N/A 04/08/2015   Procedure: Left Heart Cath and Coronary Angiography;  Surgeon: Burnell Blanks, MD;  Location: [place] [facility];  Service:  Cardiovascular;  Laterality: N/A;  . CARDIAC CATHETERIZATION N/A 04/08/2015   Procedure: Coronary Stent Intervention;  Surgeon: Burnell Blanks, MD;  Location: [place] [facility];  Service: Cardiovascular;  Laterality: N/A;  . CARDIAC DEFIBRILLATOR PLACEMENT  1991   guidant  . EUS N/A 02/26/2014   Procedure: ESOPHAGEAL ENDOSCOPIC ULTRASOUND (EUS) RADIAL;  Surgeon: Milus Banister, MD;  Location: [facility];  Service: Endoscopy;  Laterality: N/A;  radial linear  . HAND SURGERY Left 1965   "got it caught in a conveyor belt"    Family History  Problem Relation Age of Onset  . Lung cancer Brother        smoker  . Cancer Father        bladder or kidney, mets  . Parkinson's disease Mother   . Scoliosis Mother   . Ovarian cancer Mother   . Diabetes Mother   . Heart failure Mother        CHF  . Diabetes Brother   . Diabetes Maternal Aunt     Social History   Social History  . Marital status: Married    Spouse name: N/A  . Number of children: 1  . Years of education: N/A   Occupational History  . retired     Games developer   Social History Main Topics  . Smoking status:  Former Smoker    Packs/day: 1.50    Years: 42.00    Types: Cigarettes    Quit date: 02/04/2013  . Smokeless tobacco: Never Used  . Alcohol use No     Comment: "stopped drinking in 01/2013"- prior heavy with beer  . Drug use: No     Comment: "none since 1971"  . Sexual activity: Not Currently   Other Topics Concern  . Not on file   Social History Narrative  . No narrative on file    Outpatient Medications Prior to Visit  Medication Sig Dispense Refill  . clopidogrel (PLAVIX) 75 MG tablet Take 1 tablet (75 mg total) by mouth daily. 90 tablet 3  . fenofibrate 160 MG tablet Take 1 tablet (160 mg total) by mouth daily. 90 tablet 3  . levothyroxine (SYNTHROID, LEVOTHROID) 50 MCG tablet TAKE 1 TABLET EVERY DAY BEFORE BREAKFAST 90 tablet 0  . nitroGLYCERIN (NITROSTAT) 0.4 MG SL tablet Place 0.4  mg under the tongue every 5 (five) minutes as needed.     . pantoprazole (PROTONIX) 40 MG tablet TAKE 1 TABLET TWICE DAILY 180 tablet 0  . pramipexole (MIRAPEX) 0.5 MG tablet TAKE 1 TABLET THREE TIMES DAILY 270 tablet 0  . simvastatin (ZOCOR) 40 MG tablet Take 1 tablet (40 mg total) by mouth daily at 6 PM. 90 tablet 1  . spironolactone (ALDACTONE) 25 MG tablet Take 0.5 tablets (12.5 mg total) by mouth daily. 135 tablet 0  . atenolol (TENORMIN) 25 MG tablet Take 1 tablet (25 mg total) by mouth daily. 90 tablet 1   No facility-administered medications prior to visit.     No Known Allergies  Review of Systems  Constitutional: Negative for fever and malaise/fatigue.  HENT: Negative for congestion.   Eyes: Negative for blurred vision.  Respiratory: Negative for cough and shortness of breath.   Cardiovascular: Positive for leg swelling. Negative for chest pain and palpitations.  Gastrointestinal: Negative for vomiting.  Musculoskeletal: Negative for back pain.       Right leg pain  Skin: Negative for rash.  Neurological: Negative for loss of consciousness and headaches.       Objective:    Physical Exam  Constitutional: He is oriented to person, place, and time. Vital signs are normal. He appears well-developed and well-nourished. He is sleeping.  HENT:  Head: Normocephalic and atraumatic.  Mouth/Throat: Oropharynx is clear and moist.  Eyes: Pupils are equal, round, and reactive to light. EOM are normal.  Neck: Normal range of motion. Neck supple. No thyromegaly present.  Cardiovascular: Normal rate and regular rhythm.   No murmur heard. Pulmonary/Chest: Effort normal and breath sounds normal. No respiratory distress. He has no wheezes. He has no rales. He exhibits no tenderness.  Musculoskeletal: He exhibits edema and tenderness.       Left lower leg: He exhibits tenderness and edema.  Neurological: He is alert and oriented to person, place, and time.  Skin: Skin is warm and dry.    Psychiatric: He has a normal mood and affect. His behavior is normal. Judgment and thought content normal.  Nursing note and vitals reviewed.   BP 113/68 (BP Location: Right Arm, Cuff Size: Normal)   Pulse 65   Temp 97.7 F (36.5 C) (Oral)   Resp 16   Ht 5\' 11"  (1.803 m)   Wt 166 lb 9.6 oz (75.6 kg)   SpO2 96%   BMI 23.24 kg/m  Wt Readings from Last 3 Encounters:  02/06/17 166 lb 9.6  oz (75.6 kg)  02/01/17 167 lb (75.8 kg)  11/28/16 164 lb (74.4 kg)   BP Readings from Last 3 Encounters:  02/06/17 113/68  02/01/17 120/80  11/28/16 122/76     Immunization History  Administered Date(s) Administered  . Influenza, High Dose Seasonal PF 12/21/2015  . Influenza,inj,Quad PF,6+ Mos 02/12/2014, 05/06/2015  . Tdap 04/13/2014  . Zoster 04/13/2014    Health Maintenance  Topic Date Due  . FOOT EXAM  06/21/1961  . OPHTHALMOLOGY EXAM  06/21/1961  . PNA vac Low Risk Adult (1 of 2 - PCV13) 06/21/2016  . INFLUENZA VACCINE  11/08/2016  . HEMOGLOBIN A1C  08/02/2017  . URINE MICROALBUMIN  02/01/2018  . COLONOSCOPY  03/12/2019  . TETANUS/TDAP  04/13/2024  . Hepatitis C Screening  Completed  . HIV Screening  Completed    Lab Results  Component Value Date   WBC 4.2 02/01/2017   HGB 13.8 02/01/2017   HCT 42.5 02/01/2017   PLT 114.0 Repeated and verified X2. (L) 02/01/2017   GLUCOSE 102 (H) 02/01/2017   CHOL 157 02/01/2017   TRIG 69.0 02/01/2017   HDL 57.10 02/01/2017   LDLDIRECT 75.0 08/01/2016   LDLCALC 87 02/01/2017   ALT 15 02/01/2017   AST 20 02/01/2017   NA 142 02/01/2017   K 4.0 02/01/2017   CL 104 02/01/2017   CREATININE 0.98 02/01/2017   BUN 20 02/01/2017   CO2 31 02/01/2017   TSH 2.59 02/01/2017   PSA 0.98 05/06/2015   INR 0.96 09/07/2015   HGBA1C 6.2 02/01/2017   MICROALBUR 0.8 02/01/2017    Lab Results  Component Value Date   TSH 2.59 02/01/2017   Lab Results  Component Value Date   WBC 4.2 02/01/2017   HGB 13.8 02/01/2017   HCT 42.5 02/01/2017    MCV 99.9 02/01/2017   PLT 114.0 Repeated and verified X2. (L) 02/01/2017   Lab Results  Component Value Date   NA 142 02/01/2017   K 4.0 02/01/2017   CO2 31 02/01/2017   GLUCOSE 102 (H) 02/01/2017   BUN 20 02/01/2017   CREATININE 0.98 02/01/2017   BILITOT 0.3 02/01/2017   ALKPHOS 73 02/01/2017   AST 20 02/01/2017   ALT 15 02/01/2017   PROT 6.5 02/01/2017   ALBUMIN 4.1 02/01/2017   CALCIUM 9.4 02/01/2017   ANIONGAP 7 10/30/2016   GFR 81.43 02/01/2017   Lab Results  Component Value Date   CHOL 157 02/01/2017   Lab Results  Component Value Date   HDL 57.10 02/01/2017   Lab Results  Component Value Date   LDLCALC 87 02/01/2017   Lab Results  Component Value Date   TRIG 69.0 02/01/2017   Lab Results  Component Value Date   CHOLHDL 3 02/01/2017   Lab Results  Component Value Date   HGBA1C 6.2 02/01/2017         Assessment & Plan:   Problem List Items Addressed This Visit      Unprioritized   Edema of right lower extremity    With calf pain [place] neg for dvt Inc aldactone to 50 mg  Gave pt info for velcro compression stockings        Other Visit Diagnoses    Lower extremity edema    -  Primary   Relevant Medications   spironolactone (ALDACTONE) 50 MG tablet      I have discontinued Mr. Fecher's spironolactone. I am also having him start on spironolactone. Additionally, I am having him maintain his clopidogrel,  fenofibrate, atenolol, nitroGLYCERIN, simvastatin, pantoprazole, levothyroxine, and pramipexole.  Meds ordered this encounter  Medications  . spironolactone (ALDACTONE) 50 MG tablet    Sig: Take 1 tablet (50 mg total) by mouth daily.    Dispense:  90 tablet    Refill:  3    CMA served as scribe during this visit. History, Physical and Plan performed by medical provider. Documentation and orders reviewed and attested to.  Ann Held, DO

## 2017-02-06 NOTE — Assessment & Plan Note (Signed)
With calf pain [place] neg for dvt Inc aldactone to 50 mg  Gave pt info for velcro compression stockings

## 2017-02-07 ENCOUNTER — Other Ambulatory Visit: Payer: Medicare HMO

## 2017-02-13 ENCOUNTER — Other Ambulatory Visit: Payer: Self-pay | Admitting: Family Medicine

## 2017-02-13 DIAGNOSIS — D696 Thrombocytopenia, unspecified: Secondary | ICD-10-CM

## 2017-02-28 ENCOUNTER — Ambulatory Visit (INDEPENDENT_AMBULATORY_CARE_PROVIDER_SITE_OTHER): Payer: Medicare HMO | Admitting: *Deleted

## 2017-02-28 DIAGNOSIS — Z95810 Presence of automatic (implantable) cardiac defibrillator: Secondary | ICD-10-CM | POA: Diagnosis not present

## 2017-02-28 DIAGNOSIS — I4901 Ventricular fibrillation: Secondary | ICD-10-CM | POA: Diagnosis not present

## 2017-02-28 LAB — CUP PACEART INCLINIC DEVICE CHECK
Brady Statistic RV Percent Paced: 1 %
Date Time Interrogation Session: 20181121050000
HIGH POWER IMPEDANCE MEASURED VALUE: 48 Ohm
HIGH POWER IMPEDANCE MEASURED VALUE: 61 Ohm
Implantable Lead Serial Number: 5495
Implantable Pulse Generator Implant Date: 20120705
Lead Channel Impedance Value: 329 Ohm
Lead Channel Sensing Intrinsic Amplitude: 7 mV
Lead Channel Setting Pacing Amplitude: 4 V
Lead Channel Setting Pacing Pulse Width: 1 ms
MDC IDC LEAD IMPLANT DT: 19940211
MDC IDC LEAD LOCATION: 753860
MDC IDC MSMT LEADCHNL RV PACING THRESHOLD AMPLITUDE: 2 V
MDC IDC MSMT LEADCHNL RV PACING THRESHOLD PULSEWIDTH: 1 ms
MDC IDC SET LEADCHNL RV SENSING SENSITIVITY: 0.4 mV
Pulse Gen Serial Number: 115351

## 2017-02-28 NOTE — Progress Notes (Signed)
ICD check in clinic. Normal device function. Thresholds and sensing consistent with previous device measurements. Impedance trends stable over time. No evidence of any ventricular arrhythmias. Histogram distribution appropriate for patient and level of activity. Device programmed at appropriate safety margins; RV output increased to 4.0V @ 1.39ms. Device programmed to optimize intrinsic conduction. Estimated longevity 8 years. Patient declines remote follow-up. Patient education completed including shock plan. Patient aware of auditory alert tone. ROV with SK on 05/29/17.
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
## 2017-03-06 ENCOUNTER — Encounter: Payer: Self-pay | Admitting: Neurology

## 2017-03-06 ENCOUNTER — Ambulatory Visit: Payer: Medicare HMO | Admitting: Neurology

## 2017-03-06 ENCOUNTER — Encounter: Payer: Self-pay | Admitting: Psychology

## 2017-03-06 VITALS — BP 100/70 | HR 76 | Ht 71.5 in | Wt 177.0 lb

## 2017-03-06 DIAGNOSIS — G20 Parkinson's disease: Secondary | ICD-10-CM | POA: Diagnosis not present

## 2017-03-06 DIAGNOSIS — F330 Major depressive disorder, recurrent, mild: Secondary | ICD-10-CM | POA: Diagnosis not present

## 2017-03-06 DIAGNOSIS — R413 Other amnesia: Secondary | ICD-10-CM | POA: Diagnosis not present

## 2017-03-06 NOTE — Patient Instructions (Signed)
1. You have been referred for a neurocognitive evaluation in our office.   The evaluation takes approximately two hours. The first part of the appointment is a clinical interview with the neuropsychologist (Dr. MaryBeth Bailar). Please bring someone with you to this appointment if possible, as it is helpful for Dr. Bailar to hear from both you and another adult who knows you well. After speaking with Dr. Bailar, you will complete testing with her technician. The testing includes a variety of tasks- mostly question-and-answer, some paper-and-pencil. There is nothing you need to do to prepare for this appointment, but having a good night's sleep prior to the testing, and bringing eyeglasses and hearing aids (if you wear them), is advised.   About a week after the evaluation, you will return to follow up with Dr. Bailar to review the test results. This appointment is about 30 minutes. If you would like a family member to receive this information as well, please bring them to the appointment.   We have to reserve several hours of the neuropsychologist's time and the psychometrician's time for your evaluation appointment. As such, please note that there is a No-Show fee of $100. If you are unable to attend any of your appointments, please contact our office as soon as possible to reschedule.   

## 2017-03-06 NOTE — Progress Notes (Signed)
I met with Jeremy Walker while he was in the clinic today. We briefly discussed some of his social stressors. His wife is in a nursing home, he has broken relationships with his daughter and his stepdaughter. He does not have family in the area and identifies with being a social person in the past. He expressed that  He is interested in interact with more with people. We discussed two different resources : Parkinson's cycle and  Visual merchandiser to explore classes and other things that they may offer that would be of interest. The plan is for him to look into these resources and let me know what he finds out.

## 2017-03-08 ENCOUNTER — Encounter: Payer: Self-pay | Admitting: Internal Medicine

## 2017-03-21 ENCOUNTER — Other Ambulatory Visit: Payer: Medicare HMO

## 2017-03-21 ENCOUNTER — Ambulatory Visit: Payer: Medicare HMO | Admitting: Hematology & Oncology

## 2017-04-09 ENCOUNTER — Ambulatory Visit (HOSPITAL_BASED_OUTPATIENT_CLINIC_OR_DEPARTMENT_OTHER): Payer: Medicare HMO | Admitting: Hematology & Oncology

## 2017-04-09 ENCOUNTER — Other Ambulatory Visit: Payer: Medicare HMO

## 2017-04-09 ENCOUNTER — Other Ambulatory Visit: Payer: Self-pay

## 2017-04-09 VITALS — BP 126/66 | HR 63 | Temp 97.7°F | Resp 17 | Wt 186.4 lb

## 2017-04-09 DIAGNOSIS — M7989 Other specified soft tissue disorders: Secondary | ICD-10-CM | POA: Diagnosis not present

## 2017-04-09 DIAGNOSIS — D72819 Decreased white blood cell count, unspecified: Secondary | ICD-10-CM | POA: Diagnosis not present

## 2017-04-09 DIAGNOSIS — D696 Thrombocytopenia, unspecified: Secondary | ICD-10-CM

## 2017-04-09 LAB — CBC WITH DIFFERENTIAL (CANCER CENTER ONLY)
BASO#: 0 10*3/uL (ref 0.0–0.2)
BASO%: 1 % (ref 0.0–2.0)
EOS ABS: 0.1 10*3/uL (ref 0.0–0.5)
EOS%: 2.1 % (ref 0.0–7.0)
HCT: 39.6 % (ref 38.7–49.9)
HGB: 13.2 g/dL (ref 13.0–17.1)
LYMPH#: 1.1 10*3/uL (ref 0.9–3.3)
LYMPH%: 28.3 % (ref 14.0–48.0)
MCH: 32.4 pg (ref 28.0–33.4)
MCHC: 33.3 g/dL (ref 32.0–35.9)
MCV: 97 fL (ref 82–98)
MONO#: 0.4 10*3/uL (ref 0.1–0.9)
MONO%: 9.7 % (ref 0.0–13.0)
NEUT#: 2.3 10*3/uL (ref 1.5–6.5)
NEUT%: 58.9 % (ref 40.0–80.0)
RBC: 4.08 10*6/uL — AB (ref 4.20–5.70)
RDW: 13 % (ref 11.1–15.7)
WBC: 3.8 10*3/uL — ABNORMAL LOW (ref 4.0–10.0)

## 2017-04-09 LAB — CHCC SATELLITE - SMEAR

## 2017-04-09 NOTE — Progress Notes (Signed)
Referral MD  Reason for Referral: Thrombocytopenia  Chief Complaint  Patient presents with  . New Patient (Initial Visit)  : I am not sure why I am here.  HPI: Jeremy Walker is a nice 66 year old white male.  He was kindly referred by Dr. Carollee Herter.  He has a history of coronary artery disease.  He apparently had severe hypokalemia back in 1994.  He said he was at [facility].  From what I can tell, he had a AICD placed.  He is followed by cardiology in [place].  Of note, he says in 2 weeks he is going to move back to [place].  He has family up there.  He says he has a daughter down here who is bipolar and he has no relationship with her.  He has been noted to have some thrombocytopenia.  This is been mild.  Dr. Carollee Herter, as always, is a very thorough.  She has done routine labs.  She found that his platelet count was 128K in April of this year.  He had a normal white cell count was not anemic.  In October of this year, a CBC showed a white cell count of 4.2.  Hemoglobin 13.8.  Platelet count 114,000.  Most recently, in October 30, his platelet count was 103,000.  He has had a relatively normal white cell differential.  He has had no issues with his kidney function.  He is not diabetic.  He says he used to drink quite a bit.  He drank beer.  He says he does not drink any longer.  He used to smoke.  He probably smoked 2 packs a day for 25 years.  He is never had any problems with bleeding.  There is no obvious family history of blood problems.  He has had no bruising.  He has had no rashes.  He has had no weight loss or weight gain.  He is retired.  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  Overall, his main problem right now is that he says he has Parkinson's disease.  He said his mother had Parkinson's disease.  His performance status is ECOG 1.   Past Medical History:  Diagnosis Date  . AICD (automatic cardioverter/defibrillator) present    Abdominal implant was 0064 Guidant lead 6/5 mm  . Anxiety   . Atrial fibrillation ([place])   . CAD (coronary artery disease) 03/2015   a. DESx2 to mid and distal RCA  . Childhood asthma   . Chronic combined systolic and diastolic CHF (congestive heart failure) ([place])    a. ischemic CM - EF 40-45 in 12/16 // b. Echo 12/17: Echo 12/17: EF 50-55, normal wall motion, grade 1 diastolic dysfunction  . Depression   . GERD (gastroesophageal reflux disease)   . History of hiatal hernia   . HLD (hyperlipidemia)   . Hypokalemia    Recurrent associated with cardiac arrest  . Hypothyroidism   . Leg pain    ABIs 12/17: normal   . Sudden cardiac death ([place]) 1994   aborted  . Thyroid disease   . Tobacco abuse    02-16-14 past hx. heavy smoker, quit < 61yr.  :  Past Surgical History:  Procedure Laterality Date  . CARDIAC CATHETERIZATION N/A 04/08/2015   Procedure: Left Heart Cath and Coronary Angiography;  Surgeon: Burnell Blanks, MD;  Location: [facility];  Service: Cardiovascular;  Laterality: N/A;  . CARDIAC CATHETERIZATION N/A 04/08/2015   Procedure: Coronary Stent  Intervention;  Surgeon: Burnell Blanks, MD;  Location: [place] [facility];  Service: Cardiovascular;  Laterality: N/A;  . CARDIAC DEFIBRILLATOR PLACEMENT  1991   guidant  . EUS N/A 02/26/2014   Procedure: ESOPHAGEAL ENDOSCOPIC ULTRASOUND (EUS) RADIAL;  Surgeon: Milus Banister, MD;  [facility];  Service: Endoscopy;  Laterality: N/A;  radial linear  . HAND SURGERY Left 1965   "got it caught in a conveyor belt"  :   Current Outpatient Medications:  .  atenolol (TENORMIN) 25 MG tablet, Take 1 tablet (25 mg total) by mouth daily., Disp: 90 tablet, Rfl: 1 .  clopidogrel (PLAVIX) 75 MG tablet, Take 1 tablet (75 mg total) by mouth daily., Disp: 90 tablet, Rfl: 3 .  fenofibrate 160 MG tablet, Take 1 tablet (160 mg total) by mouth daily., Disp: 90 tablet, Rfl: 3 .  levothyroxine (SYNTHROID, LEVOTHROID) 50  MCG tablet, TAKE 1 TABLET EVERY DAY BEFORE BREAKFAST, Disp: 90 tablet, Rfl: 0 .  nitroGLYCERIN (NITROSTAT) 0.4 MG SL tablet, Place 0.4 mg under the tongue every 5 (five) minutes as needed. , Disp: , Rfl:  .  pantoprazole (PROTONIX) 40 MG tablet, TAKE 1 TABLET TWICE DAILY, Disp: 180 tablet, Rfl: 0 .  pramipexole (MIRAPEX) 0.5 MG tablet, TAKE 1 TABLET THREE TIMES DAILY, Disp: 270 tablet, Rfl: 0 .  simvastatin (ZOCOR) 40 MG tablet, Take 1 tablet (40 mg total) by mouth daily at 6 PM., Disp: 90 tablet, Rfl: 1 .  spironolactone (ALDACTONE) 50 MG tablet, Take 1 tablet (50 mg total) by mouth daily., Disp: 90 tablet, Rfl: 3:  :  No Known Allergies:  Family History  Problem Relation Age of Onset  . Lung cancer Brother        smoker  . Cancer Father        bladder or kidney, mets  . Parkinson's disease Mother   . Scoliosis Mother   . Ovarian cancer Mother   . Diabetes Mother   . Heart failure Mother        CHF  . Diabetes Brother   . Diabetes Maternal Aunt   :  Social History   Socioeconomic History  . Marital status: Married    Spouse name: Not on file  . Number of children: 1  . Years of education: Not on file  . Highest education level: Not on file  Social Needs  . Financial resource strain: Not on file  . Food insecurity - worry: Not on file  . Food insecurity - inability: Not on file  . Transportation needs - medical: Not on file  . Transportation needs - non-medical: Not on file  Occupational History  . Occupation: retired    Comment: Games developer  Tobacco Use  . Smoking status: Former Smoker    Packs/day: 1.50    Years: 42.00    Pack years: 63.00    Types: Cigarettes    Last attempt to quit: 02/04/2013    Years since quitting: 4.1  . Smokeless tobacco: Never Used  Substance and Sexual Activity  . Alcohol use: No    Alcohol/week: 10.5 oz    Types: 21 Standard drinks or equivalent per week    Comment: "stopped drinking in 01/2013"- prior heavy with beer  . Drug  use: No    Comment: "none since 1971"  . Sexual activity: Not Currently  Other Topics Concern  . Not on file  Social History Narrative  . Not on file  :  Pertinent items are noted in  HPI.  Exam: Well-developed well-nourished white male in no obvious distress.  Vital signs show a temperature of 97.7.  Pulse 63.  Blood pressure 126/66.  Weight is 186 pounds.  Head exam shows no ocular or oral lesions.  There are no palpable cervical or supra clavicular lymph nodes.  Lungs are clear bilaterally.  Cardiac exam regular rate and rhythm with no murmurs, rubs or bruits.  Abdomen is soft.  He has good bowel sounds.  There is no fluid wave.  The AICD is in the left side.  There is no palpable liver or spleen tip.  Back exam shows no tenderness over the spine, ribs or hips.  Extremities shows some chronic 1+ pitting edema in his legs.  Neurological exam shows no focal neurological deficits.  Skin exam shows no ecchymoses or petechia.   Recent Labs    04/09/17 1325  WBC 3.8*  HGB 13.2  HCT 39.6  PLT 144 Platelet count consistent in citrate*   No results for input(s): NA, K, CL, CO2, GLUCOSE, BUN, CREATININE, CALCIUM in the last 72 hours.  Blood smear review: Normochromic and normocytic population of red blood cells.  There are no nucleated red blood cells.  I see no teardrop cells.  He has no rouleaux formation.  There are no inclusion bodies.  White cells appear normal in morphology and maturation.  He may have slight decrease in the number of white cells.  He has mature appearing white blood cells.  I see no hypersegmented polys.  I see no atypical myeloid or lymphoid cells.  Platelets are adequate in number and size.  Platelets are well granulated.  Pathology: None    Assessment and Plan: Jeremy Walker is a nice 66 year old white male.  He really has minimal thrombocytopenia.  He has mild leukopenia.  His blood smear is unremarkable.  I just wonder if he is not developing cirrhosis.  I think the  leg swelling, along with the thrombocytopenia and leukopenia could very well represent evidence of cirrhosis.  Know that he had a CT scan of the abdomen back in April 2017 which shows no obvious hepatic abnormalities.  I just do not think that we have to do any invasive studies on him.  Given the fact that he will be moving in a couple weeks, I think this is very indicative that he would not wish to have any test done down here.  I would just follow him along.  He is totally asymptomatic.  He is very interesting.  It was fun talking with him.  I spent about 45 minutes with him.  I answered his questions.  I reassured him as much as I could that I just did not think that he had any type of blood problem.  If he ever comes back to [facility] or [place], we will be more than happy to see him if necessary.

## 2017-05-04 ENCOUNTER — Telehealth: Payer: Self-pay | Admitting: Internal Medicine

## 2017-05-04 NOTE — Telephone Encounter (Signed)
Spoke with patient in regards to his move after the 1st of February to [place], [place].  He would like a cardiology referral that uses [facility]. Thank you

## 2017-05-04 NOTE — Telephone Encounter (Signed)
Pt is moving to [place] 2/12 and would like a referral to a CAR there.  Please give pt a call for location in [place].

## 2017-05-05 NOTE — Telephone Encounter (Signed)
No contacts there sorry

## 2017-05-07 NOTE — Telephone Encounter (Signed)
lpmtcb 1/28  md

## 2017-05-07 NOTE — Telephone Encounter (Signed)
Spoke with patient and advised him that he would need to contact his PCP for a cardiology referral in [place].  He verbalized understanding.

## 2017-05-07 NOTE — Telephone Encounter (Signed)
Follow Up:      Returning your call from today. 

## 2017-05-29 ENCOUNTER — Encounter: Payer: Medicare HMO | Admitting: Internal Medicine

## 2017-06-14 ENCOUNTER — Ambulatory Visit: Payer: Medicare HMO | Admitting: *Deleted

## 2017-06-14 ENCOUNTER — Ambulatory Visit: Payer: Medicare HMO | Admitting: Family Medicine

## 2017-06-26 ENCOUNTER — Other Ambulatory Visit: Payer: Self-pay | Admitting: *Deleted

## 2017-06-26 DIAGNOSIS — K219 Gastro-esophageal reflux disease without esophagitis: Secondary | ICD-10-CM

## 2017-06-26 MED ORDER — PANTOPRAZOLE SODIUM 40 MG PO TBEC: 40.0000 mg | DELAYED_RELEASE_TABLET | Freq: Two times a day (BID) | ORAL | 0 refills | Status: AC

## 2017-07-05 ENCOUNTER — Ambulatory Visit: Payer: Medicare HMO | Admitting: Neurology

## 2017-07-20 ENCOUNTER — Ambulatory Visit: Payer: Medicare HMO | Admitting: Neurology

## 2017-08-08 DEATH — deceased

## 2017-09-10 ENCOUNTER — Other Ambulatory Visit: Payer: Self-pay | Admitting: Family Medicine

## 2017-09-10 DIAGNOSIS — E785 Hyperlipidemia, unspecified: Secondary | ICD-10-CM

## 2017-09-10 DIAGNOSIS — I2510 Atherosclerotic heart disease of native coronary artery without angina pectoris: Secondary | ICD-10-CM

## 2017-09-11 ENCOUNTER — Other Ambulatory Visit: Payer: Self-pay | Admitting: Family Medicine

## 2017-10-09 ENCOUNTER — Encounter: Payer: Self-pay | Admitting: Cardiology

## 2018-08-22 ENCOUNTER — Telehealth: Payer: Self-pay | Admitting: Cardiology

## 2018-08-22 NOTE — Telephone Encounter (Signed)
Attempted to call pt due to lost to follow up. Number busy.
